# Patient Record
Sex: Male | Born: 1955 | Race: White | Hispanic: No | Marital: Married | Smoking: Current some day smoker
Health system: Southern US, Community
[De-identification: ages and names within clinical notes are randomized; demographics above are authoritative.]

## PROBLEM LIST (undated history)

## (undated) DIAGNOSIS — C801 Malignant (primary) neoplasm, unspecified: Secondary | ICD-10-CM

## (undated) DIAGNOSIS — K219 Gastro-esophageal reflux disease without esophagitis: Secondary | ICD-10-CM

## (undated) HISTORY — DX: Gastro-esophageal reflux disease without esophagitis: K21.9

---

## 2014-11-20 ENCOUNTER — Other Ambulatory Visit: Payer: Self-pay | Admitting: Orthopedic Surgery

## 2014-11-20 DIAGNOSIS — M5416 Radiculopathy, lumbar region: Secondary | ICD-10-CM

## 2014-12-01 ENCOUNTER — Ambulatory Visit
Admission: RE | Admit: 2014-12-01 | Discharge: 2014-12-01 | Disposition: A | Discharge status: Home / Self Care | Payer: 59 | Source: Ambulatory Visit | Attending: Orthopedic Surgery | Admitting: Orthopedic Surgery

## 2014-12-01 DIAGNOSIS — M5416 Radiculopathy, lumbar region: Secondary | ICD-10-CM

## 2017-09-03 DIAGNOSIS — H353131 Nonexudative age-related macular degeneration, bilateral, early dry stage: Secondary | ICD-10-CM | POA: Diagnosis not present

## 2018-04-13 DIAGNOSIS — R1013 Epigastric pain: Secondary | ICD-10-CM | POA: Diagnosis not present

## 2018-04-13 DIAGNOSIS — R945 Abnormal results of liver function studies: Secondary | ICD-10-CM | POA: Diagnosis not present

## 2018-04-13 DIAGNOSIS — M7580 Other shoulder lesions, unspecified shoulder: Secondary | ICD-10-CM | POA: Diagnosis not present

## 2018-04-13 DIAGNOSIS — M545 Low back pain: Secondary | ICD-10-CM | POA: Diagnosis not present

## 2018-04-19 DIAGNOSIS — M545 Low back pain: Secondary | ICD-10-CM | POA: Diagnosis not present

## 2018-04-19 DIAGNOSIS — R600 Localized edema: Secondary | ICD-10-CM | POA: Diagnosis not present

## 2018-04-22 ENCOUNTER — Emergency Department (HOSPITAL_COMMUNITY): Payer: 59

## 2018-04-22 ENCOUNTER — Inpatient Hospital Stay (HOSPITAL_COMMUNITY)
Admission: EM | Admit: 2018-04-22 | Discharge: 2018-04-25 | DRG: 436 | Disposition: A | Discharge status: Home / Self Care | Payer: 59 | Source: Ambulatory Visit | Attending: Internal Medicine | Admitting: Internal Medicine

## 2018-04-22 ENCOUNTER — Ambulatory Visit (HOSPITAL_COMMUNITY): Payer: 59

## 2018-04-22 ENCOUNTER — Encounter: Payer: Self-pay | Admitting: Family Medicine

## 2018-04-22 ENCOUNTER — Other Ambulatory Visit: Payer: Self-pay

## 2018-04-22 ENCOUNTER — Encounter (HOSPITAL_COMMUNITY): Payer: Self-pay | Admitting: *Deleted

## 2018-04-22 ENCOUNTER — Ambulatory Visit (INDEPENDENT_AMBULATORY_CARE_PROVIDER_SITE_OTHER): Payer: 59 | Admitting: Family Medicine

## 2018-04-22 VITALS — BP 110/72 | HR 101 | Temp 97.1°F | Ht 70.0 in | Wt 190.2 lb

## 2018-04-22 DIAGNOSIS — C180 Malignant neoplasm of cecum: Secondary | ICD-10-CM | POA: Diagnosis not present

## 2018-04-22 DIAGNOSIS — R601 Generalized edema: Secondary | ICD-10-CM | POA: Diagnosis not present

## 2018-04-22 DIAGNOSIS — C7800 Secondary malignant neoplasm of unspecified lung: Secondary | ICD-10-CM | POA: Diagnosis not present

## 2018-04-22 DIAGNOSIS — F17210 Nicotine dependence, cigarettes, uncomplicated: Secondary | ICD-10-CM | POA: Diagnosis present

## 2018-04-22 DIAGNOSIS — D123 Benign neoplasm of transverse colon: Secondary | ICD-10-CM

## 2018-04-22 DIAGNOSIS — G8929 Other chronic pain: Secondary | ICD-10-CM | POA: Diagnosis present

## 2018-04-22 DIAGNOSIS — R0602 Shortness of breath: Secondary | ICD-10-CM

## 2018-04-22 DIAGNOSIS — K219 Gastro-esophageal reflux disease without esophagitis: Secondary | ICD-10-CM | POA: Diagnosis present

## 2018-04-22 DIAGNOSIS — R590 Localized enlarged lymph nodes: Secondary | ICD-10-CM | POA: Diagnosis present

## 2018-04-22 DIAGNOSIS — R609 Edema, unspecified: Secondary | ICD-10-CM | POA: Diagnosis not present

## 2018-04-22 DIAGNOSIS — R918 Other nonspecific abnormal finding of lung field: Secondary | ICD-10-CM

## 2018-04-22 DIAGNOSIS — C787 Secondary malignant neoplasm of liver and intrahepatic bile duct: Secondary | ICD-10-CM | POA: Diagnosis not present

## 2018-04-22 DIAGNOSIS — D72828 Other elevated white blood cell count: Secondary | ICD-10-CM | POA: Diagnosis present

## 2018-04-22 DIAGNOSIS — D124 Benign neoplasm of descending colon: Secondary | ICD-10-CM

## 2018-04-22 DIAGNOSIS — I878 Other specified disorders of veins: Secondary | ICD-10-CM | POA: Diagnosis present

## 2018-04-22 DIAGNOSIS — R600 Localized edema: Secondary | ICD-10-CM | POA: Diagnosis not present

## 2018-04-22 DIAGNOSIS — D125 Benign neoplasm of sigmoid colon: Secondary | ICD-10-CM | POA: Diagnosis not present

## 2018-04-22 DIAGNOSIS — D122 Benign neoplasm of ascending colon: Secondary | ICD-10-CM | POA: Diagnosis present

## 2018-04-22 DIAGNOSIS — K7290 Hepatic failure, unspecified without coma: Secondary | ICD-10-CM | POA: Diagnosis not present

## 2018-04-22 DIAGNOSIS — C799 Secondary malignant neoplasm of unspecified site: Secondary | ICD-10-CM

## 2018-04-22 DIAGNOSIS — M545 Low back pain: Secondary | ICD-10-CM | POA: Diagnosis present

## 2018-04-22 DIAGNOSIS — R0600 Dyspnea, unspecified: Secondary | ICD-10-CM | POA: Diagnosis not present

## 2018-04-22 DIAGNOSIS — K648 Other hemorrhoids: Secondary | ICD-10-CM | POA: Diagnosis present

## 2018-04-22 DIAGNOSIS — H3530 Unspecified macular degeneration: Secondary | ICD-10-CM | POA: Diagnosis present

## 2018-04-22 DIAGNOSIS — J9811 Atelectasis: Secondary | ICD-10-CM | POA: Diagnosis not present

## 2018-04-22 DIAGNOSIS — M549 Dorsalgia, unspecified: Secondary | ICD-10-CM

## 2018-04-22 DIAGNOSIS — R160 Hepatomegaly, not elsewhere classified: Secondary | ICD-10-CM | POA: Diagnosis present

## 2018-04-22 DIAGNOSIS — M479 Spondylosis, unspecified: Secondary | ICD-10-CM | POA: Diagnosis present

## 2018-04-22 DIAGNOSIS — K639 Disease of intestine, unspecified: Secondary | ICD-10-CM | POA: Diagnosis not present

## 2018-04-22 DIAGNOSIS — R9431 Abnormal electrocardiogram [ECG] [EKG]: Secondary | ICD-10-CM | POA: Diagnosis not present

## 2018-04-22 DIAGNOSIS — R000 Tachycardia, unspecified: Secondary | ICD-10-CM | POA: Diagnosis not present

## 2018-04-22 DIAGNOSIS — K769 Liver disease, unspecified: Secondary | ICD-10-CM | POA: Diagnosis not present

## 2018-04-22 DIAGNOSIS — C229 Malignant neoplasm of liver, not specified as primary or secondary: Secondary | ICD-10-CM | POA: Diagnosis not present

## 2018-04-22 DIAGNOSIS — K5730 Diverticulosis of large intestine without perforation or abscess without bleeding: Secondary | ICD-10-CM | POA: Diagnosis present

## 2018-04-22 DIAGNOSIS — C189 Malignant neoplasm of colon, unspecified: Secondary | ICD-10-CM

## 2018-04-22 DIAGNOSIS — C801 Malignant (primary) neoplasm, unspecified: Secondary | ICD-10-CM | POA: Diagnosis present

## 2018-04-22 DIAGNOSIS — R1903 Right lower quadrant abdominal swelling, mass and lump: Secondary | ICD-10-CM | POA: Diagnosis not present

## 2018-04-22 DIAGNOSIS — K6389 Other specified diseases of intestine: Secondary | ICD-10-CM | POA: Diagnosis present

## 2018-04-22 LAB — CBC
HCT: 39.7 % (ref 39.0–52.0)
Hemoglobin: 12.9 g/dL — ABNORMAL LOW (ref 13.0–17.0)
MCH: 30.9 pg (ref 26.0–34.0)
MCHC: 32.5 g/dL (ref 30.0–36.0)
MCV: 95.2 fL (ref 80.0–100.0)
Platelets: 319 K/uL (ref 150–400)
RBC: 4.17 MIL/uL — ABNORMAL LOW (ref 4.22–5.81)
RDW: 16.4 % — ABNORMAL HIGH (ref 11.5–15.5)
WBC: 15.2 K/uL — ABNORMAL HIGH (ref 4.0–10.5)
nRBC: 0 % (ref 0.0–0.2)

## 2018-04-22 LAB — HEPATIC FUNCTION PANEL
ALT: 53 U/L — ABNORMAL HIGH (ref 0–44)
AST: 93 U/L — ABNORMAL HIGH (ref 15–41)
Albumin: 2.9 g/dL — ABNORMAL LOW (ref 3.5–5.0)
Alkaline Phosphatase: 343 U/L — ABNORMAL HIGH (ref 38–126)
Bilirubin, Direct: 0.5 mg/dL — ABNORMAL HIGH (ref 0.0–0.2)
Indirect Bilirubin: 0.8 mg/dL (ref 0.3–0.9)
Total Bilirubin: 1.3 mg/dL — ABNORMAL HIGH (ref 0.3–1.2)
Total Protein: 7.2 g/dL (ref 6.5–8.1)

## 2018-04-22 LAB — BASIC METABOLIC PANEL
Anion gap: 15 (ref 5–15)
BUN: 9 mg/dL (ref 8–23)
CO2: 21 mmol/L — ABNORMAL LOW (ref 22–32)
Calcium: 8.1 mg/dL — ABNORMAL LOW (ref 8.9–10.3)
Chloride: 102 mmol/L (ref 98–111)
Creatinine, Ser: 0.9 mg/dL (ref 0.61–1.24)
GFR calc Af Amer: >60 mL/min (ref >60)
GFR calc non Af Amer: >60 mL/min (ref >60)
GLUCOSE: 122 mg/dL — AB (ref 70–99)
Potassium: 4.1 mmol/L (ref 3.5–5.1)
Sodium: 138 mmol/L (ref 135–145)

## 2018-04-22 LAB — I-STAT TROPONIN, ED: Troponin i, poc: 0.01 ng/mL (ref 0.00–0.08)

## 2018-04-22 LAB — BRAIN NATRIURETIC PEPTIDE: B Natriuretic Peptide: 174.2 pg/mL — ABNORMAL HIGH (ref 0.0–100.0)

## 2018-04-22 MED ORDER — IOPAMIDOL (ISOVUE-370) INJECTION 76%
100.0000 mL | Freq: Once | INTRAVENOUS | Status: AC | PRN
  Administered 2018-04-22: 100 mL via INTRAVENOUS

## 2018-04-22 MED ORDER — SODIUM CHLORIDE 0.9% FLUSH
3.0000 mL | Freq: Two times a day (BID) | INTRAVENOUS | Status: DC
  Administered 2018-04-23 – 2018-04-24 (×3): 3 mL via INTRAVENOUS

## 2018-04-22 MED ORDER — SODIUM CHLORIDE 0.9% FLUSH
3.0000 mL | Freq: Once | INTRAVENOUS | Status: AC
  Administered 2018-04-22: 3 mL via INTRAVENOUS

## 2018-04-22 MED ORDER — ONDANSETRON HCL 4 MG/2ML IJ SOLN: 4.0000 mg | Freq: Four times a day (QID) | INTRAMUSCULAR | Status: DC | PRN

## 2018-04-22 MED ORDER — ZOLPIDEM TARTRATE 5 MG PO TABS
5.0000 mg | ORAL_TABLET | Freq: Every evening | ORAL | Status: DC | PRN
  Administered 2018-04-23: 5 mg via ORAL
  Administered 2018-04-23 – 2018-04-24 (×2): 10 mg via ORAL
  Filled 2018-04-22 (×2): qty 2
  Filled 2018-04-22: qty 1

## 2018-04-22 MED ORDER — SODIUM CHLORIDE 0.9% FLUSH
3.0000 mL | Freq: Two times a day (BID) | INTRAVENOUS | Status: DC
  Administered 2018-04-23 – 2018-04-25 (×3): 3 mL via INTRAVENOUS

## 2018-04-22 MED ORDER — SODIUM CHLORIDE 0.9 % IV SOLN: 250.0000 mL | INTRAVENOUS | Status: DC | PRN

## 2018-04-22 MED ORDER — ENOXAPARIN SODIUM 40 MG/0.4ML ~~LOC~~ SOLN
40.0000 mg | SUBCUTANEOUS | Status: DC
  Administered 2018-04-23: 40 mg via SUBCUTANEOUS
  Filled 2018-04-22: qty 0.4

## 2018-04-22 MED ORDER — IOPAMIDOL (ISOVUE-370) INJECTION 76%
INTRAVENOUS | Status: AC
  Filled 2018-04-22: qty 100

## 2018-04-22 MED ORDER — POLYETHYLENE GLYCOL 3350 17 G PO PACK: 17.0000 g | PACK | Freq: Every day | ORAL | Status: DC | PRN

## 2018-04-22 MED ORDER — SODIUM CHLORIDE 0.9% FLUSH: 3.0000 mL | INTRAVENOUS | Status: DC | PRN

## 2018-04-22 MED ORDER — IOHEXOL 300 MG/ML  SOLN
100.0000 mL | Freq: Once | INTRAMUSCULAR | Status: AC | PRN
  Administered 2018-04-22: 100 mL via INTRAVENOUS

## 2018-04-22 MED ORDER — FENTANYL CITRATE (PF) 100 MCG/2ML IJ SOLN
25.0000 ug | INTRAMUSCULAR | Status: DC | PRN
  Administered 2018-04-23 – 2018-04-24 (×3): 50 ug via INTRAVENOUS
  Filled 2018-04-22 (×3): qty 2

## 2018-04-22 MED ORDER — ONDANSETRON HCL 4 MG PO TABS: 4.0000 mg | ORAL_TABLET | Freq: Four times a day (QID) | ORAL | Status: DC | PRN

## 2018-04-22 NOTE — Progress Notes (Signed)
LE venous duplex       has been completed. Preliminary results can be found under CV proc through chart review. Rashaud Ybarbo, BS, RDMS, RVT   

## 2018-04-22 NOTE — ED Triage Notes (Signed)
Pt reports SOB ongoing for 2-3 wks, pt went to pcp for bil lower leg swelling onset x 5 days, pcp sent here for eval of abnormal EKG, pt denies CP, denies n/v/d, pt denies current SOB, A&O x4

## 2018-04-22 NOTE — H&P (Signed)
History and Physical    Thomas Riley DTO:[id number] DOB: 1955-06-24 DOA: 04/22/2018  PCP: Vivi Barrack, MD   Patient coming from: Home   Chief Complaint: Leg swelling   HPI: Thomas Riley is a 63 y.o. male with medical history significant for tobacco abuse and chronic low back pain, now presenting to the emergency department for evaluation of bilateral leg edema.  Patient reports insidious worsening in his low back pain over the past 1 to 2 months with radiation to the bilateral legs and more recent bilateral lower extremity edema.  He also reports some mild exertional dyspnea and was having some night sweats last month.  He has not appreciated any fever or chills, denies any significant cough, denies chest pain, and denies abdominal pain, vomiting, or diarrhea.  He has never had a colonoscopy.  Denies any family history of GI cancer.  He was evaluated at an urgent care recently for his back pain with radiculopathy and leg swelling, was given a course of prednisone and Lasix, but his condition has continued to worsen.  He saw his PCP for evaluation of this today, his EKG was abnormal, and he was directed to the ED for further evaluation.  ED Course: Upon arrival to the ED, patient is found to be afebrile, saturating well on room air, tachycardic initially, and with stable blood pressure.  EKG features sinus tachycardia with rate 118, LAD, and incomplete RBBB.  Chemistry panel is notable for alkaline phosphatase 343, albumin 2.9, mild elevation in transaminases, and slightly elevated total bilirubin.  CBC features a leukocytosis to 15,200.  Troponin is normal and BNP slightly elevated.  Bilateral lower extremity venous ultrasound is negative for DVT.  CTA chest is negative for PE but concerning for scattered nodules concerning for metastatic disease.  CT of the abdomen and pelvis reveals a exophytic mass in the right lower quadrant, suspected to be arising from the cecum, possibly from adjacent  ileum, with diffusely enlarged liver with innumerable hypodense lesions concerning for widespread metastatic disease.  Also noted on CT abdomen is mesenteric adenopathy.  Oncology was consulted by the ED physician and recommended medical admission to [facility] for inpatient work-up, likely to include colonoscopy.  Review of Systems:  All other systems reviewed and apart from HPI, are negative.  Past Medical History:  Diagnosis Date  . GERD (gastroesophageal reflux disease)     History reviewed. No pertinent surgical history.   reports that he has been smoking cigarettes. He has been smoking about 0.30 packs per day. He has never used smokeless tobacco. He reports current alcohol use of about 12.0 standard drinks of alcohol per week. He reports that he does not use drugs.  No Known Allergies  Family History  Problem Relation Age of Onset  . COPD Mother   . Heart disease Father   . COPD Brother   . Parkinson's disease Brother   . Cancer Neg Hx      Prior to Admission medications   Medication Sig Start Date End Date Taking? Authorizing Provider  acetaminophen (TYLENOL) 325 MG tablet Take 325-650 mg by mouth every 8 (eight) hours as needed for headache.   Yes [provider]  furosemide (LASIX) 20 MG tablet Take 10 mg by mouth daily.  04/19/18  Yes [provider]  naproxen (NAPROSYN) 500 MG tablet Take 500 mg by mouth 2 (two) times daily.  04/19/18  Yes [provider]  omeprazole (PRILOSEC) 20 MG capsule Take 20 mg by mouth daily.  04/13/18  Yes [provider]  predniSONE (DELTASONE) 10 MG tablet Take 10-40 mg by mouth as directed. Take 40 mg by mouth once a day for 3 days, 30 mg once a day for 3 days, 20 mg once a day for 3 days, then 10 mg once a day for 3 days   Yes [provider]    Physical Exam: Vitals:   04/22/18 2030 04/22/18 2115 04/22/18 2200 04/22/18 2245  BP: 131/70 131/69 123/71 (!) 124/59  Pulse:      Resp: 13  14 15 19   Temp:      TempSrc:      SpO2:      Weight:      Height:        Constitutional: NAD, calm  Eyes: PERTLA, lids and conjunctivae normal ENMT: Mucous membranes are moist. Posterior pharynx clear of any exudate or lesions.   Neck: normal, supple, no masses, no thyromegaly Respiratory: clear to auscultation bilaterally, no wheezing, no crackles. Normal respiratory effort.    Cardiovascular: S1 & S2 heard, regular rate and rhythm. Pitting edema to bilateral LE's. Abdomen: Mild distension, soft, non-tender. Bowel sounds active.  Musculoskeletal: no clubbing / cyanosis. No joint deformity upper and lower extremities.    Skin: no significant rashes, lesions, ulcers. Warm, dry, well-perfused. Neurologic: CN 2-12 grossly intact. Sensation intact. Moving all extremities.  Psychiatric: Alert and oriented x 3. Calm, cooperative.    Labs on Admission: I have personally reviewed following labs and imaging studies  CBC: Recent Labs  Lab 04/22/18 1556  WBC 15.2*  HGB 12.9*  HCT 39.7  MCV 95.2  PLT 656   Basic Metabolic Panel: Recent Labs  Lab 04/22/18 1556  NA 138  K 4.1  CL 102  CO2 21*  GLUCOSE 122*  BUN 9  CREATININE 0.90  CALCIUM 8.1*   GFR: Estimated Creatinine Clearance: 86.7 mL/min (by C-G formula based on SCr of 0.9 mg/dL). Liver Function Tests: Recent Labs  Lab 04/22/18 1905  AST 93*  ALT 53*  ALKPHOS 343*  BILITOT 1.3*  PROT 7.2  ALBUMIN 2.9*   No results for input(s): LIPASE, AMYLASE in the last 168 hours. No results for input(s): AMMONIA in the last 168 hours. Coagulation Profile: No results for input(s): INR, PROTIME in the last 168 hours. Cardiac Enzymes: No results for input(s): CKTOTAL, CKMB, CKMBINDEX, TROPONINI in the last 168 hours. BNP (last 3 results) No results for input(s): PROBNP in the last 8760 hours. HbA1C: No results for input(s): HGBA1C in the last 72 hours. CBG: No results for input(s): GLUCAP in the last 168 hours. Lipid  Profile: No results for input(s): CHOL, HDL, LDLCALC, TRIG, CHOLHDL, LDLDIRECT in the last 72 hours. Thyroid Function Tests: No results for input(s): TSH, T4TOTAL, FREET4, T3FREE, THYROIDAB in the last 72 hours. Anemia Panel: No results for input(s): VITAMINB12, FOLATE, FERRITIN, TIBC, IRON, RETICCTPCT in the last 72 hours. Urine analysis: No results found for: COLORURINE, APPEARANCEUR, LABSPEC, PHURINE, GLUCOSEU, HGBUR, BILIRUBINUR, KETONESUR, PROTEINUR, UROBILINOGEN, NITRITE, LEUKOCYTESUR Sepsis Labs: @LABRCNTIP (procalcitonin:4,lacticidven:4) )No results found for this or any previous visit (from the past 240 hour(s)).   Radiological Exams on Admission: Dg Chest 2 View  Result Date: 04/22/2018 CLINICAL DATA:  Feet and ankle swelling. EXAM: CHEST - 2 VIEW COMPARISON:  No prior. FINDINGS: Mediastinum hilar structures normal. Heart size normal. Mild right upper lobe infiltrate. Mild bibasilar atelectasis. Follow-up chest x-ray to demonstrate clearing suggested. No pleural effusion or pneumothorax. IMPRESSION: Mild right upper lobe infiltrate. Mild bibasilar  atelectasis. Follow-up chest x-rays to demonstrate clearing suggested. Followup PA and lateral chest X-ray is recommended in 3-4 weeks following trial of antibiotic therapy to ensure resolution and exclude underlying malignancy. Electronically Signed   By: Marcello Moores  Register   On: 04/22/2018 16:30   Ct Angio Chest Pe W And/or Wo Contrast  Result Date: 04/22/2018 CLINICAL DATA:  Dyspnea ongoing for 2-3 weeks. Bilateral lower extremity swelling onset 5 days ago. EXAM: CT ANGIOGRAPHY CHEST WITH CONTRAST TECHNIQUE: Multidetector CT imaging of the chest was performed using the standard protocol during bolus administration of intravenous contrast. Multiplanar CT image reconstructions and MIPs were obtained to evaluate the vascular anatomy. CONTRAST:  55 cc ISOVUE-370 IOPAMIDOL (ISOVUE-370) INJECTION 76% COMPARISON:  CXR 04/22/2018 FINDINGS:  Cardiovascular: Conventional branch pattern of the great vessels with atherosclerosis of the left subclavian artery. Nonaneurysmal minimally atherosclerotic thoracic aorta. No dissection. Satisfactory opacification of the pulmonary arteries to the segmental level without acute pulmonary embolus. Heart size is top normal. No pericardial effusion or thickening. No significant calcific coronary arteriosclerosis. Mediastinum/Nodes: No enlarged mediastinal, hilar, or axillary lymph nodes. Thyroid gland, trachea, and esophagus demonstrate no significant findings. Lungs/Pleura: Scattered noncalcified pulmonary nodules are identified bilaterally, measuring up to 4.7 mm. Two of these are seen in the right upper lobe anterior segment (4.6 mm each) series 6/71, one in the right middle lobe (4.7 mm in average), image 68, one in the left upper lobe (4.6 mm) image 72 and a 3 mm nodule in the right lower lobe, image 86. Dependent bibasilar atelectasis is noted. No pneumothorax or pulmonary consolidations. Trachea and mainstem bronchi appear patent. Upper Abdomen: The liver is enlarged and in homogeneous in appearance. Subtle underlying hypodense masses are not excluded but suboptimally characterized on this exam. A calcified granuloma is seen in the left hepatic lobe. The included left adrenal gland is unremarkable. The right adrenal gland is excluded. Musculoskeletal: No chest wall mass. No aggressive osseous lesion of the bony thorax. No acute fracture. Review of the MIP images confirms the above findings. IMPRESSION: 1. No acute pulmonary embolus, aortic aneurysm or dissection. 2. Scattered bilateral noncalcified pulmonary nodules measuring up to 4.7 mm. This in conjunction with an enlarged heterogeneous, abnormal appearance of the liver with subtle hypodense masslike abnormalities within raise concern for possible metastatic disease. Dedicated CT of the abdomen and pelvis with IV contrast is recommended for further  correlation. Aortic Atherosclerosis (ICD10-I70.0). Electronically Signed   By: Ashley Royalty M.D.   On: 04/22/2018 19:01   Ct Abdomen Pelvis W Contrast  Result Date: 04/22/2018 CLINICAL DATA:  Initial evaluation for acute abdominal distension. EXAM: CT ABDOMEN AND PELVIS WITH CONTRAST TECHNIQUE: Multidetector CT imaging of the abdomen and pelvis was performed using the standard protocol following bolus administration of intravenous contrast. CONTRAST:  164mL OMNIPAQUE IOHEXOL 300 MG/ML  SOLN COMPARISON:  Prior CTA from earlier the same day. FINDINGS: Lower chest: Mild scattered subsegmental atelectatic changes present within the lung bases. 5 mm nodule within the lower right lung, better evaluated on recent chest CT. Small pericardial effusion. Hepatobiliary: Liver is diffusely enlarged with innumerable hypodense lesions scattered throughout both the right and left hepatic lobes, highly concerning for widespread hepatic metastases. Liver demonstrates a abnormal nodular contour or. For reference purposes, the dominant lesion position within the central aspect of the liver measures approximately 7.3 x 6.2 cm (series 3, image 40). Partially distended gallbladder without acute abnormality. Trace pericholecystic fluid noted, favored to be related intrinsic liver disease. No biliary dilatation. Pancreas: Pancreas within  normal limits. Spleen: Spleen within normal limits. Adrenals/Urinary Tract: 15 mm left adrenal nodule, indeterminate. Right adrenal gland grossly unremarkable, although poorly visualized due to mass effect by the adjacent liver. Kidneys fairly equal in size with symmetric enhancement. Subcentimeter hypodensity at the lower pole of the right kidney too small the characterize. No visible nephrolithiasis or hydronephrosis. No focal enhancing renal mass. Contrast material within the renal collecting systems bilaterally. No hydroureter. Partially distended bladder without acute abnormality. Secreted IV  contrast material within the bladder lumen. Stomach/Bowel: Stomach displaced towards the left upper quadrant by the enlarged liver, but otherwise unremarkable without acute finding. No evidence for small bowel obstruction. There is an irregular exophytic enhancing mass within the right lower quadrant measuring approximately 4.3 x 3.1 x 4.7 cm (series 3, image 66). Mass is intimately associated with an adjacent loop of ileum as well as the cecum, making it not entirely clear from which the lesion is arising from, although cecal origin is favored. Additionally, the appendix is not definitely visualized, raising the possibility for an appendiceal origin which could be considered as well. Surrounding inflammatory stranding and induration within the adjacent mesenteric fat. No other acute inflammatory changes about the bowels. Few scattered colonic diverticula noted. Vascular/Lymphatic: Normal intravascular enhancement seen throughout the intra-abdominal aorta. Mild to moderate aorto bi-iliac atherosclerotic disease. No aneurysm. Mesenteric vessels patent proximally. Enlarged 13 mm mesenteric lymph node within the right lower quadrant, concerning for nodal metastasis (series 3, image 66). No other pathologically enlarged lymph nodes identified within the abdomen and pelvis. Reproductive: Prostate normal. Other: Small volume mildly complex free fluid within the pelvis. No free intraperitoneal air. Musculoskeletal: Mild diffuse anasarca within the external soft tissues, likely related to intrinsic liver disease. No acute osseous abnormality. No discrete lytic or blastic osseous lesions. IMPRESSION: 1. 4.3 x 3.1 x 4.7 cm exophytic enhancing mass within the right lower quadrant, intimately associated with an adjacent loop of ileum as well as the cecum, making it not entirely clear from which the lesion is arising from, although cecal origin is perhaps favored. Additionally, the appendix is not definitely visualized, raising  the possibility for an appendiceal origin which could be considered as well. 2. Diffusely enlarged liver with innumerable hypodense lesions, concerning for widespread hepatic metastases. 3. Enlarged 13 mm mesenteric lymph node within the right lower quadrant, concerning for nodal metastasis. 4. 15 mm left adrenal nodule, indeterminate. 5. 5 mm right lower lobe pulmonary nodule, better evaluated on recent chest CT, but concerning for metastatic disease. 6. Small volume free fluid within the pelvis with mild diffuse anasarca. Electronically Signed   By: Jeannine Boga M.D.   On: 04/22/2018 20:47   Vas [place] Lower Extremity Venous (dvt) (only [facility])  Result Date: 04/22/2018  Lower Venous Study Indications: Pitting edema.  Performing Technologist: June Leap RDMS, RVT  Examination Guidelines: A complete evaluation includes B-mode imaging, spectral Doppler, color Doppler, and power Doppler as needed of all accessible portions of each vessel. Bilateral testing is considered an integral part of a complete examination. Limited examinations for reoccurring indications may be performed as noted.  Right Venous Findings: +---------+---------------+---------+-----------+----------+-------+          CompressibilityPhasicitySpontaneityPropertiesSummary +---------+---------------+---------+-----------+----------+-------+ CFV      Full           Yes      Yes                          +---------+---------------+---------+-----------+----------+-------+ SFJ  Full                                                 +---------+---------------+---------+-----------+----------+-------+ FV Prox  Full                                                 +---------+---------------+---------+-----------+----------+-------+ FV Mid   Full                                                 +---------+---------------+---------+-----------+----------+-------+ FV DistalFull                                                  +---------+---------------+---------+-----------+----------+-------+ PFV      Full                                                 +---------+---------------+---------+-----------+----------+-------+ POP      Full           Yes      Yes                          +---------+---------------+---------+-----------+----------+-------+ PTV      Full                                                 +---------+---------------+---------+-----------+----------+-------+ PERO     Full                                                 +---------+---------------+---------+-----------+----------+-------+  Left Venous Findings: +---------+---------------+---------+-----------+----------+-------+          CompressibilityPhasicitySpontaneityPropertiesSummary +---------+---------------+---------+-----------+----------+-------+ CFV      Full           Yes      Yes                          +---------+---------------+---------+-----------+----------+-------+ SFJ      Full                                                 +---------+---------------+---------+-----------+----------+-------+ FV Prox  Full                                                 +---------+---------------+---------+-----------+----------+-------+  FV Mid   Full                                                 +---------+---------------+---------+-----------+----------+-------+ FV DistalFull                                                 +---------+---------------+---------+-----------+----------+-------+ PFV      Full                                                 +---------+---------------+---------+-----------+----------+-------+ POP      Full           Yes      Yes                          +---------+---------------+---------+-----------+----------+-------+ PTV      Full                                                  +---------+---------------+---------+-----------+----------+-------+ PERO     Full                                                 +---------+---------------+---------+-----------+----------+-------+    Summary: Right: There is no evidence of deep vein thrombosis in the lower extremity. No cystic structure found in the popliteal fossa. Left: There is no evidence of deep vein thrombosis in the lower extremity. No cystic structure found in the popliteal fossa.  *See table(s) above for measurements and observations.    Preliminary     EKG: Independently reviewed. Sinus tachycardia (rate 118), LAD, incomplete RBBB.   Assessment/Plan   1. Metastatic disease  - Presents with bilateral LE edema, reports recent night sweats, found to have mass arising from cecum or possibly ileum with pulmonary nodules, innumerable liver lesions, and mesenteric adenopathy  - He has never had colonoscopy  - Oncology consulting and much appreciated, recommending admission to [facility] for inpatient workup   2. Anasarca  - Presents with bilateral leg edema developing over past week, found to have anasarca  - Bilateral LE venous US negative for DVT  - No hx of heart or kidney disease  - Likely secondary to liver lesions, possible compression of IVC from tumor     DVT prophylaxis: Lovenox  Code Status: Full  Family Communication: Discussed with patient  Consults called: Oncology  Admission status: Inpatient     Vianne Bulls, MD [facility] Pager [phone]  If 7PM-7AM, please contact night-coverage [email or web address] Password [email or web address]  04/22/2018, 11:01 PM

## 2018-04-22 NOTE — ED Notes (Signed)
Patient ambulated to bathroom with minimal assistance.  

## 2018-04-22 NOTE — ED Provider Notes (Signed)
Emergency Department Provider Note   I have reviewed the triage vital signs and the nursing notes.   HISTORY  Chief Complaint Leg Swelling and Abnormal ECG   HPI Thomas Riley is a 63 y.o. male with unknown past medical history as he does not have a primary care doctor until today presents the emergency department with leg swelling.  Saw that the patient's had some chronic back issues does cause some progressively worsening weakness in his legs however since Monday he had significant edema to bilateral legs.  This came on suddenly and has progressively improved while he is on Lasix from the urgent care however he followed up with the primary care doctor today who did an EKG that showed a right bundle branch block with possible ischemic changes so sent to the ER for further evaluation.  Patient states that he has had no cough or fevers but does have dyspnea on exertion and dyspnea when bending over.  No orthopnea PND.  On review of the records it seems that Dr. Jerline Pain noticed the patient had significant dyspnea when just bending over to tie his shoes.  No known blood clots.  No chest pain. No other associated or modifying symptoms.    Past Medical History:  Diagnosis Date  . GERD (gastroesophageal reflux disease)     Patient Active Problem List   Diagnosis Date Noted  . Metastatic disease ([place]) 04/22/2018  . Pulmonary nodules 04/22/2018  . Liver lesion 04/22/2018  . Intestinal mass 04/22/2018  . Anasarca 04/22/2018  . Peripheral edema     History reviewed. No pertinent surgical history.  Current Outpatient Rx  . Order #: [id number] Class: Historical Med  . Order #: [id number] Class: Historical Med  . Order #: [id number] Class: Historical Med  . Order #: [id number] Class: Historical Med  . Order #: [id number] Class: Historical Med    Allergies Patient has no known allergies.  Family History  Problem Relation Age of Onset  . COPD Mother   . Heart disease Father   . COPD  Brother   . Parkinson's disease Brother   . Cancer Neg Hx     Social History Social History   Tobacco Use  . Smoking status: Current Some Day Smoker    Packs/day: 0.30    Types: Cigarettes  . Smokeless tobacco: Never Used  Substance Use Topics  . Alcohol use: Yes    Alcohol/week: 12.0 standard drinks    Types: 12 Cans of beer per week  . Drug use: Never    Review of Systems  All other systems negative except as documented in the HPI. All pertinent positives and negatives as reviewed in the HPI. ____________________________________________   PHYSICAL EXAM:  VITAL SIGNS: ED Triage Vitals  Enc Vitals Group     BP 04/22/18 1549 126/66     Pulse Rate 04/22/18 1549 (!) 110     Resp 04/22/18 1549 14     Temp 04/22/18 1549 97.9 F (36.6 C)     Temp Source 04/22/18 1549 Oral     SpO2 04/22/18 1549 98 %     Weight 04/22/18 1547 190 lb (86.2 kg)     Height 04/22/18 1547 5\' 10"  (1.778 m)     Head Circumference --      Peak Flow --      Pain Score 04/22/18 1547 0     Pain Loc --      Pain Edu? --      Excl. in [place]? --     Constitutional:  Alert and oriented. Well appearing and in no acute distress. Eyes: Conjunctivae are normal. PERRL. EOMI. Head: Atraumatic. Nose: No congestion/rhinnorhea. Mouth/Throat: Mucous membranes are moist.  Oropharynx non-erythematous. Neck: No stridor.  No meningeal signs.   Cardiovascular: tachycardic rate, regular rhythm. Good peripheral circulation. Grossly normal heart sounds.   Respiratory: tachypneic respiratory effort.  No retractions. Lungs CTAB. Gastrointestinal: Soft and nontender. No distention.  Musculoskeletal: No lower extremity tenderness but has bilateral 2+ edema to knees. No gross deformities of extremities. Neurologic:  Normal speech and language. No gross focal neurologic deficits are appreciated.  Skin:  Skin is warm, dry and intact. No rash noted.   ____________________________________________   LABS (all labs ordered  are listed, but only abnormal results are displayed)  Labs Reviewed  BASIC METABOLIC PANEL - Abnormal; Notable for the following components:      Result Value   CO2 21 (*)    Glucose, Bld 122 (*)    Calcium 8.1 (*)    All other components within normal limits  CBC - Abnormal; Notable for the following components:   WBC 15.2 (*)    RBC 4.17 (*)    Hemoglobin 12.9 (*)    RDW 16.4 (*)    All other components within normal limits  BRAIN NATRIURETIC PEPTIDE - Abnormal; Notable for the following components:   B Natriuretic Peptide 174.2 (*)    All other components within normal limits  HEPATIC FUNCTION PANEL - Abnormal; Notable for the following components:   Albumin 2.9 (*)    AST 93 (*)    ALT 53 (*)    Alkaline Phosphatase 343 (*)    Total Bilirubin 1.3 (*)    Bilirubin, Direct 0.5 (*)    All other components within normal limits  HIV ANTIBODY (ROUTINE TESTING W REFLEX)  COMPREHENSIVE METABOLIC PANEL  CBC WITH DIFFERENTIAL/PLATELET  I-STAT TROPONIN, ED   ____________________________________________  EKG   EKG Interpretation  Date/Time:  Friday April 22 2018 15:43:24 EST Ventricular Rate:  118 PR Interval:  114 QRS Duration: 104 QT Interval:  370 QTC Calculation: 518 R Axis:   -43 Text Interpretation:  Sinus tachycardia Left axis deviation Incomplete right bundle branch block Inferior infarct , age undetermined Possible Anterolateral infarct , age undetermined Abnormal ECG No old tracing to compare Confirmed by Merrily Pew [phone]) on 04/22/2018 4:24:02 PM       ____________________________________________  RADIOLOGY  Dg Chest 2 View  Result Date: 04/22/2018 CLINICAL DATA:  Feet and ankle swelling. EXAM: CHEST - 2 VIEW COMPARISON:  No prior. FINDINGS: Mediastinum hilar structures normal. Heart size normal. Mild right upper lobe infiltrate. Mild bibasilar atelectasis. Follow-up chest x-ray to demonstrate clearing suggested. No pleural effusion or pneumothorax.  IMPRESSION: Mild right upper lobe infiltrate. Mild bibasilar atelectasis. Follow-up chest x-rays to demonstrate clearing suggested. Followup PA and lateral chest X-ray is recommended in 3-4 weeks following trial of antibiotic therapy to ensure resolution and exclude underlying malignancy. Electronically Signed   By: Marcello Moores  Register   On: 04/22/2018 16:30   Ct Angio Chest Pe W And/or Wo Contrast  Result Date: 04/22/2018 CLINICAL DATA:  Dyspnea ongoing for 2-3 weeks. Bilateral lower extremity swelling onset 5 days ago. EXAM: CT ANGIOGRAPHY CHEST WITH CONTRAST TECHNIQUE: Multidetector CT imaging of the chest was performed using the standard protocol during bolus administration of intravenous contrast. Multiplanar CT image reconstructions and MIPs were obtained to evaluate the vascular anatomy. CONTRAST:  55 cc ISOVUE-370 IOPAMIDOL (ISOVUE-370) INJECTION 76% COMPARISON:  CXR 04/22/2018 FINDINGS: Cardiovascular:  Conventional branch pattern of the great vessels with atherosclerosis of the left subclavian artery. Nonaneurysmal minimally atherosclerotic thoracic aorta. No dissection. Satisfactory opacification of the pulmonary arteries to the segmental level without acute pulmonary embolus. Heart size is top normal. No pericardial effusion or thickening. No significant calcific coronary arteriosclerosis. Mediastinum/Nodes: No enlarged mediastinal, hilar, or axillary lymph nodes. Thyroid gland, trachea, and esophagus demonstrate no significant findings. Lungs/Pleura: Scattered noncalcified pulmonary nodules are identified bilaterally, measuring up to 4.7 mm. Two of these are seen in the right upper lobe anterior segment (4.6 mm each) series 6/71, one in the right middle lobe (4.7 mm in average), image 68, one in the left upper lobe (4.6 mm) image 72 and a 3 mm nodule in the right lower lobe, image 86. Dependent bibasilar atelectasis is noted. No pneumothorax or pulmonary consolidations. Trachea and mainstem bronchi  appear patent. Upper Abdomen: The liver is enlarged and in homogeneous in appearance. Subtle underlying hypodense masses are not excluded but suboptimally characterized on this exam. A calcified granuloma is seen in the left hepatic lobe. The included left adrenal gland is unremarkable. The right adrenal gland is excluded. Musculoskeletal: No chest wall mass. No aggressive osseous lesion of the bony thorax. No acute fracture. Review of the MIP images confirms the above findings. IMPRESSION: 1. No acute pulmonary embolus, aortic aneurysm or dissection. 2. Scattered bilateral noncalcified pulmonary nodules measuring up to 4.7 mm. This in conjunction with an enlarged heterogeneous, abnormal appearance of the liver with subtle hypodense masslike abnormalities within raise concern for possible metastatic disease. Dedicated CT of the abdomen and pelvis with IV contrast is recommended for further correlation. Aortic Atherosclerosis (ICD10-I70.0). Electronically Signed   By: Ashley Royalty M.D.   On: 04/22/2018 19:01   Ct Abdomen Pelvis W Contrast  Result Date: 04/22/2018 CLINICAL DATA:  Initial evaluation for acute abdominal distension. EXAM: CT ABDOMEN AND PELVIS WITH CONTRAST TECHNIQUE: Multidetector CT imaging of the abdomen and pelvis was performed using the standard protocol following bolus administration of intravenous contrast. CONTRAST:  153mL OMNIPAQUE IOHEXOL 300 MG/ML  SOLN COMPARISON:  Prior CTA from earlier the same day. FINDINGS: Lower chest: Mild scattered subsegmental atelectatic changes present within the lung bases. 5 mm nodule within the lower right lung, better evaluated on recent chest CT. Small pericardial effusion. Hepatobiliary: Liver is diffusely enlarged with innumerable hypodense lesions scattered throughout both the right and left hepatic lobes, highly concerning for widespread hepatic metastases. Liver demonstrates a abnormal nodular contour or. For reference purposes, the dominant lesion  position within the central aspect of the liver measures approximately 7.3 x 6.2 cm (series 3, image 40). Partially distended gallbladder without acute abnormality. Trace pericholecystic fluid noted, favored to be related intrinsic liver disease. No biliary dilatation. Pancreas: Pancreas within normal limits. Spleen: Spleen within normal limits. Adrenals/Urinary Tract: 15 mm left adrenal nodule, indeterminate. Right adrenal gland grossly unremarkable, although poorly visualized due to mass effect by the adjacent liver. Kidneys fairly equal in size with symmetric enhancement. Subcentimeter hypodensity at the lower pole of the right kidney too small the characterize. No visible nephrolithiasis or hydronephrosis. No focal enhancing renal mass. Contrast material within the renal collecting systems bilaterally. No hydroureter. Partially distended bladder without acute abnormality. Secreted IV contrast material within the bladder lumen. Stomach/Bowel: Stomach displaced towards the left upper quadrant by the enlarged liver, but otherwise unremarkable without acute finding. No evidence for small bowel obstruction. There is an irregular exophytic enhancing mass within the right lower quadrant measuring approximately 4.3 x  3.1 x 4.7 cm (series 3, image 66). Mass is intimately associated with an adjacent loop of ileum as well as the cecum, making it not entirely clear from which the lesion is arising from, although cecal origin is favored. Additionally, the appendix is not definitely visualized, raising the possibility for an appendiceal origin which could be considered as well. Surrounding inflammatory stranding and induration within the adjacent mesenteric fat. No other acute inflammatory changes about the bowels. Few scattered colonic diverticula noted. Vascular/Lymphatic: Normal intravascular enhancement seen throughout the intra-abdominal aorta. Mild to moderate aorto bi-iliac atherosclerotic disease. No aneurysm.  Mesenteric vessels patent proximally. Enlarged 13 mm mesenteric lymph node within the right lower quadrant, concerning for nodal metastasis (series 3, image 66). No other pathologically enlarged lymph nodes identified within the abdomen and pelvis. Reproductive: Prostate normal. Other: Small volume mildly complex free fluid within the pelvis. No free intraperitoneal air. Musculoskeletal: Mild diffuse anasarca within the external soft tissues, likely related to intrinsic liver disease. No acute osseous abnormality. No discrete lytic or blastic osseous lesions. IMPRESSION: 1. 4.3 x 3.1 x 4.7 cm exophytic enhancing mass within the right lower quadrant, intimately associated with an adjacent loop of ileum as well as the cecum, making it not entirely clear from which the lesion is arising from, although cecal origin is perhaps favored. Additionally, the appendix is not definitely visualized, raising the possibility for an appendiceal origin which could be considered as well. 2. Diffusely enlarged liver with innumerable hypodense lesions, concerning for widespread hepatic metastases. 3. Enlarged 13 mm mesenteric lymph node within the right lower quadrant, concerning for nodal metastasis. 4. 15 mm left adrenal nodule, indeterminate. 5. 5 mm right lower lobe pulmonary nodule, better evaluated on recent chest CT, but concerning for metastatic disease. 6. Small volume free fluid within the pelvis with mild diffuse anasarca. Electronically Signed   By: Jeannine Boga M.D.   On: 04/22/2018 20:47   Vas [place] Lower Extremity Venous (dvt) (only [facility])  Result Date: 04/22/2018  Lower Venous Study Indications: Pitting edema.  Performing Technologist: June Leap RDMS, RVT  Examination Guidelines: A complete evaluation includes B-mode imaging, spectral Doppler, color Doppler, and power Doppler as needed of all accessible portions of each vessel. Bilateral testing is considered an integral part of a complete examination.  Limited examinations for reoccurring indications may be performed as noted.  Right Venous Findings: +---------+---------------+---------+-----------+----------+-------+          CompressibilityPhasicitySpontaneityPropertiesSummary +---------+---------------+---------+-----------+----------+-------+ CFV      Full           Yes      Yes                          +---------+---------------+---------+-----------+----------+-------+ SFJ      Full                                                 +---------+---------------+---------+-----------+----------+-------+ FV Prox  Full                                                 +---------+---------------+---------+-----------+----------+-------+ FV Mid   Full                                                 +---------+---------------+---------+-----------+----------+-------+  FV DistalFull                                                 +---------+---------------+---------+-----------+----------+-------+ PFV      Full                                                 +---------+---------------+---------+-----------+----------+-------+ POP      Full           Yes      Yes                          +---------+---------------+---------+-----------+----------+-------+ PTV      Full                                                 +---------+---------------+---------+-----------+----------+-------+ PERO     Full                                                 +---------+---------------+---------+-----------+----------+-------+  Left Venous Findings: +---------+---------------+---------+-----------+----------+-------+          CompressibilityPhasicitySpontaneityPropertiesSummary +---------+---------------+---------+-----------+----------+-------+ CFV      Full           Yes      Yes                          +---------+---------------+---------+-----------+----------+-------+ SFJ      Full                                                  +---------+---------------+---------+-----------+----------+-------+ FV Prox  Full                                                 +---------+---------------+---------+-----------+----------+-------+ FV Mid   Full                                                 +---------+---------------+---------+-----------+----------+-------+ FV DistalFull                                                 +---------+---------------+---------+-----------+----------+-------+ PFV      Full                                                 +---------+---------------+---------+-----------+----------+-------+  POP      Full           Yes      Yes                          +---------+---------------+---------+-----------+----------+-------+ PTV      Full                                                 +---------+---------------+---------+-----------+----------+-------+ PERO     Full                                                 +---------+---------------+---------+-----------+----------+-------+    Summary: Right: There is no evidence of deep vein thrombosis in the lower extremity. No cystic structure found in the popliteal fossa. Left: There is no evidence of deep vein thrombosis in the lower extremity. No cystic structure found in the popliteal fossa.  *See table(s) above for measurements and observations.    Preliminary     ____________________________________________   PROCEDURES  Procedure(s) performed:   Procedures   ____________________________________________   INITIAL IMPRESSION / ASSESSMENT AND PLAN / ED COURSE  Patient with a peripheral yesterday on chest x-ray but no symptoms consistent with pneumonia.  Acute onset of bilateral lower extremity swelling with dyspnea exertion concerning for pulmonary embolus will get a CT scan.  Also could be acute onset heart failure however his blood pressure is normal, no JVD and normal lung  sounds with no known coronary artery disease or HTN so will add on a BNP.  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  Suspicion for possible IVC compression causing the lower extremity edema versus new onset heart failure.  Discussed with Dr. Marin Olp who requests patient be admitted to [facility] and they will see him in the morning help manage but no further orders tonight.  Discussed with Dr. Myna Hidalgo with [facility] hospital[facility] who will admit.   Pertinent labs & imaging results that were available during my care of the patient were reviewed by me and considered in my medical decision making (see chart for details).  ____________________________________________  FINAL CLINICAL IMPRESSION(S) / ED DIAGNOSES  Final diagnoses:  Peripheral edema     MEDICATIONS GIVEN DURING THIS VISIT:  Medications  iopamidol (ISOVUE-370) 76 % injection (has no administration in time range)  enoxaparin (LOVENOX) injection 40 mg (has no administration in time range)  sodium chloride flush (NS) 0.9 % injection 3 mL (has no administration in time range)  sodium chloride flush (NS) 0.9 % injection 3 mL (has no administration in time range)  sodium chloride flush (NS) 0.9 % injection 3 mL (has no administration in time range)  0.9 %  sodium chloride infusion (has no administration in time range)  polyethylene glycol (MIRALAX / GLYCOLAX) packet 17 g (has no administration in time range)  ondansetron (ZOFRAN) tablet 4 mg (has no administration in time range)    Or  ondansetron (ZOFRAN) injection 4 mg (has no administration in time range)  fentaNYL (SUBLIMAZE) injection 25-50 mcg (has no administration in time range)  zolpidem (AMBIEN) tablet 5-10 mg (has no administration in time range)  sodium chloride  flush (NS) 0.9 % injection 3 mL (3 mLs Intravenous Given 04/22/18 1756)  iopamidol (ISOVUE-370) 76 % injection 100 mL (100 mLs Intravenous Contrast Given 04/22/18  1831)  iohexol (OMNIPAQUE) 300 MG/ML solution 100 mL (100 mLs Intravenous Contrast Given 04/22/18 2013)     NEW OUTPATIENT MEDICATIONS STARTED DURING THIS VISIT:  New Prescriptions   No medications on file    Note:  This note was prepared with assistance of Dragon voice recognition software. Occasional wrong-word or sound-a-like substitutions may have occurred due to the inherent limitations of voice recognition software.   Merrily Pew, MD 04/22/18 [phone]

## 2018-04-22 NOTE — Progress Notes (Signed)
Chief Complaint:  Thomas Riley is a 63 y.o. male who presents today with a chief complaint of leg edema and to establish care  Assessment/Plan:  Dyspnea on exertion/leg edema/abnormal EKG Concern for anginal equivalent given his profound dyspnea on exertion-patient was observed becoming severely dyspneic with minimal exertion such as tying his shoe.  Other than smoking, he has no known risk factors however has not had primary care for several years.  Given his abnormal EKG, I think he needs rule out for ACS at this point.  Recommended patient go to the emergency department for further evaluation.  He left via private vehicle.  He will need ACS rule out.  Also will likely need VTE rule out-not sure if this was done in his urgent care visit 3 days ago.    Subjective:  HPI:  Leg Edema  Symptoms started about 4 days ago.  He went to urgent care 3 days ago and had work-up at that time including blood work and a chest x-ray.  Was reportedly told that his calcium was high and his liver numbers were high but otherwise the rest of his work-up was negative.  He was started on Lasix 10 mg daily which has not significantly helped with his symptoms.  Symptoms seem to be worsening.  Swelling is located in bilateral legs.  Worsened throughout the day.  He also notes increasing shortness of breath on exertion for the past couple of weeks.  This is also worsening.  No chest pain.  No symptoms at rest.  Patient's exercise tolerance is significantly decreased from already was previously.  No orthopnea or PND.  He also reports a history of chronic back pain.  He has seen orthopedics in the past however has not seen anybody for several years.  His pain seems to be worsening.  Pain will occasionally radiate into his right leg.  He has had worsening weakness in bilateral legs as well.  ROS: Per HPI, otherwise a complete review of systems was negative.   PMH:  The following were reviewed and entered/updated in  epic: Past Medical History:  Diagnosis Date  . GERD (gastroesophageal reflux disease)    There are no active problems to display for this patient.  History reviewed. No pertinent surgical history.  Family History  Problem Relation Age of Onset  . COPD Mother   . Heart disease Father   . COPD Brother   . Parkinson's disease Brother   . Cancer Neg Hx     Medications- reviewed and updated Current Outpatient Medications  Medication Sig Dispense Refill  . furosemide (LASIX) 20 MG tablet     . naproxen (NAPROSYN) 500 MG tablet     . omeprazole (PRILOSEC) 20 MG capsule      No current facility-administered medications for this visit.     Allergies-reviewed and updated Not on File  Social History   Socioeconomic History  . Marital status: Married    Spouse name: Not on file  . Number of children: Not on file  . Years of education: Not on file  . Highest education level: Not on file  Occupational History  . Not on file  Social Needs  . Financial resource strain: Not on file  . Food insecurity:    Worry: Not on file    Inability: Not on file  . Transportation needs:    Medical: Not on file    Non-medical: Not on file  Tobacco Use  . Smoking status: Current Some Day Smoker  Packs/day: 0.30    Types: Cigarettes  . Smokeless tobacco: Never Used  Substance and Sexual Activity  . Alcohol use: Yes    Alcohol/week: 1.0 standard drinks    Types: 1 Cans of beer per week  . Drug use: Never  . Sexual activity: Not on file  Lifestyle  . Physical activity:    Days per week: Not on file    Minutes per session: Not on file  . Stress: Not on file  Relationships  . Social connections:    Talks on phone: Not on file    Gets together: Not on file    Attends religious service: Not on file    Active member of club or organization: Not on file    Attends meetings of clubs or organizations: Not on file    Relationship status: Not on file  Other Topics Concern  . Not on file   Social History Narrative  . Not on file         Objective:  Physical Exam: BP 110/72 (BP Location: Left Arm, Patient Position: Sitting, Cuff Size: Normal)   Pulse (!) 101   Temp (!) 97.1 F (36.2 C) (Oral)   Ht 5\' 10"  (1.778 m)   Wt 190 lb 3.2 oz (86.3 kg)   SpO2 94%   BMI 27.29 kg/m   Gen: NAD, resting comfortably.  Patient becomes visibly dyspneic with minimal exertion such as removing his shoes. CV: Tachycardic. Regular rhythm with no murmurs appreciated Pulm: Normal work of breathing at rest. Clear to auscultation bilaterally with no crackles, wheezes, or rhonchi GI: Normal bowel sounds present. Soft, Nontender, Nondistended. MSK: 3+ pitting edema to knees bilaterally. Skin: Warm, dry Neuro: Grossly normal, moves all extremities Psych: Normal affect and thought content  EKG: Sinus tachycardia.  Right bundle branch block. T wave inversion and V1 through V4.      Patient has a HIGH level of medical complexity due to number of diagnoses/treatment options and risk of morbidity/mortality.   Algis Greenhouse. Jerline Pain, MD 04/22/2018 3:18 PM

## 2018-04-22 NOTE — ED Notes (Signed)
Pt given sandwich and ginger ale, OK per MD Mesner

## 2018-04-22 NOTE — Patient Instructions (Signed)
I am concerned that you could be having a blockage in your heart. I would like for you to go straight to the ED to have blood work done.  They can also check for potential blood clots.

## 2018-04-23 ENCOUNTER — Other Ambulatory Visit: Payer: Self-pay

## 2018-04-23 DIAGNOSIS — C189 Malignant neoplasm of colon, unspecified: Secondary | ICD-10-CM

## 2018-04-23 DIAGNOSIS — C787 Secondary malignant neoplasm of liver and intrahepatic bile duct: Secondary | ICD-10-CM

## 2018-04-23 DIAGNOSIS — G8929 Other chronic pain: Secondary | ICD-10-CM

## 2018-04-23 DIAGNOSIS — K6389 Other specified diseases of intestine: Secondary | ICD-10-CM

## 2018-04-23 DIAGNOSIS — K639 Disease of intestine, unspecified: Secondary | ICD-10-CM

## 2018-04-23 DIAGNOSIS — F17210 Nicotine dependence, cigarettes, uncomplicated: Secondary | ICD-10-CM

## 2018-04-23 DIAGNOSIS — M549 Dorsalgia, unspecified: Secondary | ICD-10-CM

## 2018-04-23 DIAGNOSIS — R160 Hepatomegaly, not elsewhere classified: Secondary | ICD-10-CM | POA: Diagnosis present

## 2018-04-23 DIAGNOSIS — C180 Malignant neoplasm of cecum: Secondary | ICD-10-CM

## 2018-04-23 DIAGNOSIS — R600 Localized edema: Secondary | ICD-10-CM

## 2018-04-23 LAB — CBC WITH DIFFERENTIAL/PLATELET
Abs Immature Granulocytes: 0.13 10*3/uL — ABNORMAL HIGH (ref 0.00–0.07)
BASOS PCT: 0 %
Basophils Absolute: 0 10*3/uL (ref 0.0–0.1)
Eosinophils Absolute: 0 10*3/uL (ref 0.0–0.5)
Eosinophils Relative: 0 %
HCT: 35.4 % — ABNORMAL LOW (ref 39.0–52.0)
Hemoglobin: 11.4 g/dL — ABNORMAL LOW (ref 13.0–17.0)
Immature Granulocytes: 1 %
Lymphocytes Relative: 10 %
Lymphs Abs: 1.1 10*3/uL (ref 0.7–4.0)
MCH: 30.2 pg (ref 26.0–34.0)
MCHC: 32.2 g/dL (ref 30.0–36.0)
MCV: 93.7 fL (ref 80.0–100.0)
Monocytes Absolute: 0.9 10*3/uL (ref 0.1–1.0)
Monocytes Relative: 8 %
Neutro Abs: 8.6 10*3/uL — ABNORMAL HIGH (ref 1.7–7.7)
Neutrophils Relative %: 81 %
Platelets: 204 10*3/uL (ref 150–400)
RBC: 3.78 MIL/uL — ABNORMAL LOW (ref 4.22–5.81)
RDW: 16.5 % — ABNORMAL HIGH (ref 11.5–15.5)
WBC: 10.8 10*3/uL — AB (ref 4.0–10.5)
nRBC: 0 % (ref 0.0–0.2)

## 2018-04-23 LAB — COMPREHENSIVE METABOLIC PANEL
ALT: 42 U/L (ref 0–44)
ANION GAP: 12 (ref 5–15)
AST: 86 U/L — ABNORMAL HIGH (ref 15–41)
Albumin: 2.4 g/dL — ABNORMAL LOW (ref 3.5–5.0)
Alkaline Phosphatase: 298 U/L — ABNORMAL HIGH (ref 38–126)
BUN: 8 mg/dL (ref 8–23)
CO2: 24 mmol/L (ref 22–32)
Calcium: 7.7 mg/dL — ABNORMAL LOW (ref 8.9–10.3)
Chloride: 104 mmol/L (ref 98–111)
Creatinine, Ser: 0.88 mg/dL (ref 0.61–1.24)
GFR calc Af Amer: >60 mL/min (ref >60)
GFR calc non Af Amer: >60 mL/min (ref >60)
Glucose, Bld: 112 mg/dL — ABNORMAL HIGH (ref 70–99)
Potassium: 3.7 mmol/L (ref 3.5–5.1)
Sodium: 140 mmol/L (ref 135–145)
Total Bilirubin: 1.3 mg/dL — ABNORMAL HIGH (ref 0.3–1.2)
Total Protein: 5.7 g/dL — ABNORMAL LOW (ref 6.5–8.1)

## 2018-04-23 LAB — HIV ANTIBODY (ROUTINE TESTING W REFLEX): HIV Screen 4th Generation wRfx: NONREACTIVE

## 2018-04-23 LAB — GLUCOSE, CAPILLARY: Glucose-Capillary: 102 mg/dL — ABNORMAL HIGH (ref 70–99)

## 2018-04-23 MED ORDER — PEG-KCL-NACL-NASULF-NA ASC-C 100 G PO SOLR
0.5000 | Freq: Once | ORAL | Status: AC
  Administered 2018-04-24: 100 g via ORAL
  Filled 2018-04-23: qty 1

## 2018-04-23 MED ORDER — PEG-KCL-NACL-NASULF-NA ASC-C 100 G PO SOLR
0.5000 | Freq: Once | ORAL | Status: AC
  Administered 2018-04-23: 100 g via ORAL
  Filled 2018-04-23 (×2): qty 1

## 2018-04-23 MED ORDER — ENOXAPARIN SODIUM 40 MG/0.4ML ~~LOC~~ SOLN: 40.0000 mg | SUBCUTANEOUS | Status: DC

## 2018-04-23 MED ORDER — PEG-KCL-NACL-NASULF-NA ASC-C 100 G PO SOLR: 1.0000 | Freq: Once | ORAL | Status: DC

## 2018-04-23 NOTE — Progress Notes (Signed)
PROGRESS NOTE    Thomas Riley  CBS:[id number] DOB: 1955/05/25 DOA: 04/22/2018 PCP: Vivi Barrack, MD    Brief Narrative:  63 year old male who presented with leg edema.  He does have significant past medical history for tobacco abuse, and chronic lower back pain. Reported iInsidious worsening of his lower back pain over the last 2 months, with radiation to bilateral lower extremities.  Recently he had noticed bilateral lower extremity edema that has been associated with exertional dyspnea, and night sweats.  No chest pain or angina.  His symptoms were refractive to steroids and furosemide.  His initial physical examination blood pressure 131/70, respiratory rate 13, moist mucous membranes, lungs clear to auscultation bilaterally, heart S1-S2 present and rhythmic, abdomen mildly distended, no guarding, no tenderness, positive lower extremity edema bilaterally.  Sodium was 138, potassium 4.1, chloride 102, bicarb 21, glucose 122, BUN 9, creatinine 0.90, white count 15.2, hemoglobin 12.9, hematocrit 39.7, platelets 319.  Chest CT was negative for pulmonary embolism.  He had scattered bilateral noncalcified pulmonary nodules measuring up to 4.7 mm.  CT of the abdomen and pelvis showed a 4.3 x 3.1 x 4.7 cm exophytic enhancing mass within the right lower quadrant, intimately associated with an adjacent loop of ileum as well as cecum.  Diffusely enlarged liver with innumerable hypodense lesions.  13 mm mesenteric lymph node within the right lower quadrant concerning for nodal metastasis.  15 mm left adrenal nodule.  EKG had sinus rhythm, left axis deviation, incomplete right bundle branch block, septal T wave inversions.  The patient was admitted to the hospital with a working diagnosis of newly diagnosed colonic and liver masses.   Assessment & Plan:   Principal Problem:   Metastatic disease ([place]) Active Problems:   Pulmonary nodules   Liver lesion   Intestinal mass   Anasarca   1. Colonic mass  with liver lesions and mesenteric lymphadenopathy. Very high pretest probability for colon cancer, case discussed with Dr. Ammie Dalton from oncology. GI consulted for possible colonoscopy, will need Port placement. US guided liver biopsy has been ordered. AST 68 and ALT 42, check INR in am.    2. Lower extremity edema/ with anasarca. Will continue supportive therapy, possible decreased oncotic pressure due to hypoproteinemia. Will consult nutrition.   3. Reactive leukocytosis. No signs of infection, will continue to hold on antibiotic therapy and will follow on cell count in am. Patient was on systemic steroids as outpatient. \  4. GERD. Continue proton pump inhibitors.    DVT prophylaxis: scd   Code Status:  full Family Communication:  Disposition Plan/ discharge barriers: pending GI workup.   Body mass index is 25.8 kg/m. Malnutrition Type:      Malnutrition Characteristics:      Nutrition Interventions:     RN Pressure Injury Documentation:     Consultants:   Oncology   GI   Procedures:     Antimicrobials:       Subjective: Patient continue to have lower extremity edema, no chest pain or dyspnea, no nausea or vomiting.   Objective: Vitals:   04/22/18 2345 04/23/18 0100 04/23/18 0132 04/23/18 0451  BP: 112/74 113/64 127/66 128/70  Pulse:   91 93  Resp: 20 16 18 16   Temp:   98.2 F (36.8 C) 98.1 F (36.7 C)  TempSrc:   Oral Oral  SpO2:   98% 100%  Weight:   84.2 kg 83.9 kg  Height:   5\' 11"  (1.803 m)    No intake or  output data in the 24 hours ending 04/23/18 1104 Filed Weights   04/22/18 1547 04/23/18 0132 04/23/18 0451  Weight: 86.2 kg 84.2 kg 83.9 kg    Examination:   General: Not in pain or dyspnea, deconditioned  Neurology: Awake and alert, non focal  E ENT: mild pallor, no icterus, oral mucosa moist Cardiovascular: No JVD. S1-S2 present, rhythmic, no gallops, rubs, or murmurs. ++/+++ pitting lower extremity edema, more left than  right. Pulmonary: positive breath sounds bilaterally, adequate air movement, no wheezing, rhonchi or rales. Gastrointestinal. Abdomen with no organomegaly, non tender, no rebound or guarding Skin. No rashes Musculoskeletal: no joint deformities     Data Reviewed: I have personally reviewed following labs and imaging studies  CBC: Recent Labs  Lab 04/22/18 1556 04/23/18 0309  WBC 15.2* 10.8*  NEUTROABS  --  8.6*  HGB 12.9* 11.4*  HCT 39.7 35.4*  MCV 95.2 93.7  PLT 319 264   Basic Metabolic Panel: Recent Labs  Lab 04/22/18 1556 04/23/18 0309  NA 138 140  K 4.1 3.7  CL 102 104  CO2 21* 24  GLUCOSE 122* 112*  BUN 9 8  CREATININE 0.90 0.88  CALCIUM 8.1* 7.7*   GFR: Estimated Creatinine Clearance: 91.5 mL/min (by C-G formula based on SCr of 0.88 mg/dL). Liver Function Tests: Recent Labs  Lab 04/22/18 1905 04/23/18 0309  AST 93* 86*  ALT 53* 42  ALKPHOS 343* 298*  BILITOT 1.3* 1.3*  PROT 7.2 5.7*  ALBUMIN 2.9* 2.4*   No results for input(s): LIPASE, AMYLASE in the last 168 hours. No results for input(s): AMMONIA in the last 168 hours. Coagulation Profile: No results for input(s): INR, PROTIME in the last 168 hours. Cardiac Enzymes: No results for input(s): CKTOTAL, CKMB, CKMBINDEX, TROPONINI in the last 168 hours. BNP (last 3 results) No results for input(s): PROBNP in the last 8760 hours. HbA1C: No results for input(s): HGBA1C in the last 72 hours. CBG: Recent Labs  Lab 04/23/18 0817  GLUCAP 102*   Lipid Profile: No results for input(s): CHOL, HDL, LDLCALC, TRIG, CHOLHDL, LDLDIRECT in the last 72 hours. Thyroid Function Tests: No results for input(s): TSH, T4TOTAL, FREET4, T3FREE, THYROIDAB in the last 72 hours. Anemia Panel: No results for input(s): VITAMINB12, FOLATE, FERRITIN, TIBC, IRON, RETICCTPCT in the last 72 hours.    Radiology Studies: I have reviewed all of the imaging during this hospital visit personally     Scheduled Meds: .  enoxaparin (LOVENOX) injection  40 mg Subcutaneous Q24H  . sodium chloride flush  3 mL Intravenous Q12H  . sodium chloride flush  3 mL Intravenous Q12H   Continuous Infusions: . sodium chloride       LOS: 1 day        Mauricio Gerome Apley, MD

## 2018-04-23 NOTE — Consult Note (Signed)
Chief Complaint: Patient was seen in consultation today for image guided liver lesion biopsy and Port-A-Cath placement Chief Complaint  Patient presents with  . Leg Swelling  . Abnormal ECG     Referring Physician(s): Sherrill,B  Supervising Physician: Sandi Mariscal  Patient Status: [facility] - In-pt  History of Present Illness: Thomas Riley is a 63 y.o. male smoker who was recently admitted to [facility] with bilateral lower extremity swelling, dyspnea, and back pain.  Subsequent imaging revealed no PE but scattered bilateral pulmonary nodules, right lower quadrant/?cecal mass, liver lesions, enlarged mesenteric lymph node and  left adrenal nodule.  Bilateral lower extremity venous Doppler revealed no DVT.  Request now received from oncology for liver lesion biopsy and Port-A-Cath placement.  Past Medical History:  Diagnosis Date  . GERD (gastroesophageal reflux disease)     History reviewed. No pertinent surgical history.  Allergies: Patient has no known allergies.  Medications: Prior to Admission medications   Medication Sig Start Date End Date Taking? Authorizing Provider  acetaminophen (TYLENOL) 325 MG tablet Take 325-650 mg by mouth every 8 (eight) hours as needed for headache.   Yes [provider]  furosemide (LASIX) 20 MG tablet Take 10 mg by mouth daily.  04/19/18  Yes [provider]  naproxen (NAPROSYN) 500 MG tablet Take 500 mg by mouth 2 (two) times daily.  04/19/18  Yes [provider]  omeprazole (PRILOSEC) 20 MG capsule Take 20 mg by mouth daily.  04/13/18  Yes [provider]  predniSONE (DELTASONE) 10 MG tablet Take 10-40 mg by mouth as directed. Take 40 mg by mouth once a day for 3 days, 30 mg once a day for 3 days, 20 mg once a day for 3 days, then 10 mg once a day for 3 days   Yes [provider]     Family History  Problem Relation Age of Onset  . COPD Mother   . Heart disease Father   . COPD Brother    . Parkinson's disease Brother   . Cancer Neg Hx     Social History   Socioeconomic History  . Marital status: Married    Spouse name: Not on file  . Number of children: Not on file  . Years of education: Not on file  . Highest education level: Not on file  Occupational History  . Not on file  Social Needs  . Financial resource strain: Not on file  . Food insecurity:    Worry: Not on file    Inability: Not on file  . Transportation needs:    Medical: Not on file    Non-medical: Not on file  Tobacco Use  . Smoking status: Current Some Day Smoker    Packs/day: 0.30    Types: Cigarettes  . Smokeless tobacco: Never Used  Substance and Sexual Activity  . Alcohol use: Yes    Alcohol/week: 12.0 standard drinks    Types: 12 Cans of beer per week  . Drug use: Never  . Sexual activity: Not on file  Lifestyle  . Physical activity:    Days per week: Not on file    Minutes per session: Not on file  . Stress: Not on file  Relationships  . Social connections:    Talks on phone: Not on file    Gets together: Not on file    Attends religious service: Not on file    Active member of club or organization: Not on file  Attends meetings of clubs or organizations: Not on file    Relationship status: Not on file  Other Topics Concern  . Not on file  Social History Narrative  . Not on file      Review of Systems see above; currently denies fever, headache, chest pain, dyspnea, nausea, vomiting or bleeding.  Has had some constipation.  Vital Signs: BP 128/70 (BP Location: Left Arm)   Pulse 93   Temp 98.1 F (36.7 C) (Oral)   Resp 16   Ht 5\' 11"  (1.803 m)   Wt 184 lb 15.5 oz (83.9 kg)   SpO2 100%   BMI 25.80 kg/m   Physical Exam awake, alert.  Chest with slightly diminished breath sounds right base, left clear.  Heart with regular rate and rhythm.  Abdomen soft, positive bowel sounds, hepatomegaly noted.  Left greater than right lower extremity edema.  Imaging: Dg  Chest 2 View  Result Date: 04/22/2018 CLINICAL DATA:  Feet and ankle swelling. EXAM: CHEST - 2 VIEW COMPARISON:  No prior. FINDINGS: Mediastinum hilar structures normal. Heart size normal. Mild right upper lobe infiltrate. Mild bibasilar atelectasis. Follow-up chest x-ray to demonstrate clearing suggested. No pleural effusion or pneumothorax. IMPRESSION: Mild right upper lobe infiltrate. Mild bibasilar atelectasis. Follow-up chest x-rays to demonstrate clearing suggested. Followup PA and lateral chest X-ray is recommended in 3-4 weeks following trial of antibiotic therapy to ensure resolution and exclude underlying malignancy. Electronically Signed   By: Marcello Moores  Register   On: 04/22/2018 16:30   Ct Angio Chest Pe W And/or Wo Contrast  Result Date: 04/22/2018 CLINICAL DATA:  Dyspnea ongoing for 2-3 weeks. Bilateral lower extremity swelling onset 5 days ago. EXAM: CT ANGIOGRAPHY CHEST WITH CONTRAST TECHNIQUE: Multidetector CT imaging of the chest was performed using the standard protocol during bolus administration of intravenous contrast. Multiplanar CT image reconstructions and MIPs were obtained to evaluate the vascular anatomy. CONTRAST:  55 cc ISOVUE-370 IOPAMIDOL (ISOVUE-370) INJECTION 76% COMPARISON:  CXR 04/22/2018 FINDINGS: Cardiovascular: Conventional branch pattern of the great vessels with atherosclerosis of the left subclavian artery. Nonaneurysmal minimally atherosclerotic thoracic aorta. No dissection. Satisfactory opacification of the pulmonary arteries to the segmental level without acute pulmonary embolus. Heart size is top normal. No pericardial effusion or thickening. No significant calcific coronary arteriosclerosis. Mediastinum/Nodes: No enlarged mediastinal, hilar, or axillary lymph nodes. Thyroid gland, trachea, and esophagus demonstrate no significant findings. Lungs/Pleura: Scattered noncalcified pulmonary nodules are identified bilaterally, measuring up to 4.7 mm. Two of these are seen  in the right upper lobe anterior segment (4.6 mm each) series 6/71, one in the right middle lobe (4.7 mm in average), image 68, one in the left upper lobe (4.6 mm) image 72 and a 3 mm nodule in the right lower lobe, image 86. Dependent bibasilar atelectasis is noted. No pneumothorax or pulmonary consolidations. Trachea and mainstem bronchi appear patent. Upper Abdomen: The liver is enlarged and in homogeneous in appearance. Subtle underlying hypodense masses are not excluded but suboptimally characterized on this exam. A calcified granuloma is seen in the left hepatic lobe. The included left adrenal gland is unremarkable. The right adrenal gland is excluded. Musculoskeletal: No chest wall mass. No aggressive osseous lesion of the bony thorax. No acute fracture. Review of the MIP images confirms the above findings. IMPRESSION: 1. No acute pulmonary embolus, aortic aneurysm or dissection. 2. Scattered bilateral noncalcified pulmonary nodules measuring up to 4.7 mm. This in conjunction with an enlarged heterogeneous, abnormal appearance of the liver with subtle hypodense masslike abnormalities  within raise concern for possible metastatic disease. Dedicated CT of the abdomen and pelvis with IV contrast is recommended for further correlation. Aortic Atherosclerosis (ICD10-I70.0). Electronically Signed   By: Ashley Royalty M.D.   On: 04/22/2018 19:01   Ct Abdomen Pelvis W Contrast  Result Date: 04/22/2018 CLINICAL DATA:  Initial evaluation for acute abdominal distension. EXAM: CT ABDOMEN AND PELVIS WITH CONTRAST TECHNIQUE: Multidetector CT imaging of the abdomen and pelvis was performed using the standard protocol following bolus administration of intravenous contrast. CONTRAST:  170mL OMNIPAQUE IOHEXOL 300 MG/ML  SOLN COMPARISON:  Prior CTA from earlier the same day. FINDINGS: Lower chest: Mild scattered subsegmental atelectatic changes present within the lung bases. 5 mm nodule within the lower right lung, better  evaluated on recent chest CT. Small pericardial effusion. Hepatobiliary: Liver is diffusely enlarged with innumerable hypodense lesions scattered throughout both the right and left hepatic lobes, highly concerning for widespread hepatic metastases. Liver demonstrates a abnormal nodular contour or. For reference purposes, the dominant lesion position within the central aspect of the liver measures approximately 7.3 x 6.2 cm (series 3, image 40). Partially distended gallbladder without acute abnormality. Trace pericholecystic fluid noted, favored to be related intrinsic liver disease. No biliary dilatation. Pancreas: Pancreas within normal limits. Spleen: Spleen within normal limits. Adrenals/Urinary Tract: 15 mm left adrenal nodule, indeterminate. Right adrenal gland grossly unremarkable, although poorly visualized due to mass effect by the adjacent liver. Kidneys fairly equal in size with symmetric enhancement. Subcentimeter hypodensity at the lower pole of the right kidney too small the characterize. No visible nephrolithiasis or hydronephrosis. No focal enhancing renal mass. Contrast material within the renal collecting systems bilaterally. No hydroureter. Partially distended bladder without acute abnormality. Secreted IV contrast material within the bladder lumen. Stomach/Bowel: Stomach displaced towards the left upper quadrant by the enlarged liver, but otherwise unremarkable without acute finding. No evidence for small bowel obstruction. There is an irregular exophytic enhancing mass within the right lower quadrant measuring approximately 4.3 x 3.1 x 4.7 cm (series 3, image 66). Mass is intimately associated with an adjacent loop of ileum as well as the cecum, making it not entirely clear from which the lesion is arising from, although cecal origin is favored. Additionally, the appendix is not definitely visualized, raising the possibility for an appendiceal origin which could be considered as well. Surrounding  inflammatory stranding and induration within the adjacent mesenteric fat. No other acute inflammatory changes about the bowels. Few scattered colonic diverticula noted. Vascular/Lymphatic: Normal intravascular enhancement seen throughout the intra-abdominal aorta. Mild to moderate aorto bi-iliac atherosclerotic disease. No aneurysm. Mesenteric vessels patent proximally. Enlarged 13 mm mesenteric lymph node within the right lower quadrant, concerning for nodal metastasis (series 3, image 66). No other pathologically enlarged lymph nodes identified within the abdomen and pelvis. Reproductive: Prostate normal. Other: Small volume mildly complex free fluid within the pelvis. No free intraperitoneal air. Musculoskeletal: Mild diffuse anasarca within the external soft tissues, likely related to intrinsic liver disease. No acute osseous abnormality. No discrete lytic or blastic osseous lesions. IMPRESSION: 1. 4.3 x 3.1 x 4.7 cm exophytic enhancing mass within the right lower quadrant, intimately associated with an adjacent loop of ileum as well as the cecum, making it not entirely clear from which the lesion is arising from, although cecal origin is perhaps favored. Additionally, the appendix is not definitely visualized, raising the possibility for an appendiceal origin which could be considered as well. 2. Diffusely enlarged liver with innumerable hypodense lesions, concerning for widespread hepatic  metastases. 3. Enlarged 13 mm mesenteric lymph node within the right lower quadrant, concerning for nodal metastasis. 4. 15 mm left adrenal nodule, indeterminate. 5. 5 mm right lower lobe pulmonary nodule, better evaluated on recent chest CT, but concerning for metastatic disease. 6. Small volume free fluid within the pelvis with mild diffuse anasarca. Electronically Signed   By: Jeannine Boga M.D.   On: 04/22/2018 20:47   Vas [place] Lower Extremity Venous (dvt) (only [facility])  Result Date: 04/22/2018  Lower Venous  Study Indications: Pitting edema.  Performing Technologist: June Leap RDMS, RVT  Examination Guidelines: A complete evaluation includes B-mode imaging, spectral Doppler, color Doppler, and power Doppler as needed of all accessible portions of each vessel. Bilateral testing is considered an integral part of a complete examination. Limited examinations for reoccurring indications may be performed as noted.  Right Venous Findings: +---------+---------------+---------+-----------+----------+-------+          CompressibilityPhasicitySpontaneityPropertiesSummary +---------+---------------+---------+-----------+----------+-------+ CFV      Full           Yes      Yes                          +---------+---------------+---------+-----------+----------+-------+ SFJ      Full                                                 +---------+---------------+---------+-----------+----------+-------+ FV Prox  Full                                                 +---------+---------------+---------+-----------+----------+-------+ FV Mid   Full                                                 +---------+---------------+---------+-----------+----------+-------+ FV DistalFull                                                 +---------+---------------+---------+-----------+----------+-------+ PFV      Full                                                 +---------+---------------+---------+-----------+----------+-------+ POP      Full           Yes      Yes                          +---------+---------------+---------+-----------+----------+-------+ PTV      Full                                                 +---------+---------------+---------+-----------+----------+-------+ PERO     Full                                                 +---------+---------------+---------+-----------+----------+-------+  Left Venous Findings:  +---------+---------------+---------+-----------+----------+-------+          CompressibilityPhasicitySpontaneityPropertiesSummary +---------+---------------+---------+-----------+----------+-------+ CFV      Full           Yes      Yes                          +---------+---------------+---------+-----------+----------+-------+ SFJ      Full                                                 +---------+---------------+---------+-----------+----------+-------+ FV Prox  Full                                                 +---------+---------------+---------+-----------+----------+-------+ FV Mid   Full                                                 +---------+---------------+---------+-----------+----------+-------+ FV DistalFull                                                 +---------+---------------+---------+-----------+----------+-------+ PFV      Full                                                 +---------+---------------+---------+-----------+----------+-------+ POP      Full           Yes      Yes                          +---------+---------------+---------+-----------+----------+-------+ PTV      Full                                                 +---------+---------------+---------+-----------+----------+-------+ PERO     Full                                                 +---------+---------------+---------+-----------+----------+-------+    Summary: Right: There is no evidence of deep vein thrombosis in the lower extremity. No cystic structure found in the popliteal fossa. Left: There is no evidence of deep vein thrombosis in the lower extremity. No cystic structure found in the popliteal fossa.  *See table(s) above for measurements and observations.    Preliminary     Labs:  CBC: Recent Labs    04/22/18 1556 04/23/18 0309  WBC 15.2* 10.8*  HGB 12.9* 11.4*  HCT 39.7 35.4*  PLT 319 204    COAGS: No results for  input(s): INR, APTT in the  last 8760 hours.  BMP: Recent Labs    04/22/18 1556 04/23/18 0309  NA 138 140  K 4.1 3.7  CL 102 104  CO2 21* 24  GLUCOSE 122* 112*  BUN 9 8  CALCIUM 8.1* 7.7*  CREATININE 0.90 0.88  GFRNONAA >60 >60  GFRAA >60 >60    LIVER FUNCTION TESTS: Recent Labs    04/22/18 1905 04/23/18 0309  BILITOT 1.3* 1.3*  AST 93* 86*  ALT 53* 42  ALKPHOS 343* 298*  PROT 7.2 5.7*  ALBUMIN 2.9* 2.4*    TUMOR MARKERS: No results for input(s): AFPTM, CEA, CA199, CHROMGRNA in the last 8760 hours.  Assessment and Plan: 63 y.o. male smoker who was recently admitted to [facility] with bilateral lower extremity swelling, dyspnea, and back pain.  Subsequent imaging revealed no PE but scattered bilateral pulmonary nodules, right lower quadrant/?cecal mass, liver lesions, enlarged mesenteric lymph node and  left adrenal nodule.  No prior hx malignancy. Bilateral lower extremity venous Doppler revealed no DVT.  Request now received from oncology for liver lesion biopsy and Port-A-Cath placement.  Imaging studies have been reviewed by Dr. Pascal Lux.  Details/risks of procedures, including but not limited to, internal bleeding, infection, injury to adjacent structures, venous thrombosis d/w pt with his understanding and consent.  Procedures tentatively scheduled for 2/3.  Will need to hold Lovenox doses on 2/2 and 2/3 until after procedures.   Thank you for this interesting consult.  I greatly enjoyed meeting Eural Holzschuh and look forward to participating in their care.  A copy of this report was sent to the requesting provider on this date.  Electronically Signed: D. Rowe Robert, PA-C 04/23/2018, 1:05 PM   I spent a total of 30 minutes  in face to face in clinical consultation, greater than 50% of which was counseling/coordinating care for image guided liver lesion biopsy and Port-A-Cath placement

## 2018-04-23 NOTE — H&P (View-Only) (Signed)
Referring Provider: [facility]   Primary Care Physician:  Vivi Barrack, MD Primary Gastroenterologist: None     Reason for Consultation:   Colon mass   ASSESSMENT / PLAN:    63 year old male with RLQ mass on CT scan.  Mass ever to arise from the cecum but unclear as it is closely associated with a loop of ileum .  Appendix not definitely seen so appendiceal origin also a consideration . There are multiple liver lesions /hepatomegaly, mesenteric lymphadenopathy, pulmonary nodules- all concerning for metastatic disease. -CEA pending -Oncology has already evaluated and requesting liver lesion bx for diagnosis and molecular testing.  -Oncology also requesting colonoscopy to confirm colon primary. Will prep today for a.m. colonoscopy. The risks and benefits of colonoscopy with possible polypectomy were discussed and the patient agrees to proceed.   BLE edema / anasarca. Compression or IVC / HV ?  Bilateral u/s negative for DVT. No hx of heart or kidney disease. BNP minimally elevated.   HPI:      HPI: Thomas Riley is a 63 y.o. male with no significant past medical history.  He presented to his PCP 04/22/2018 with DOE, BLE edema,  worsening of chronic low back pain and abnormal ECG raising concern for ACS. Also concern for VTE. Sent to ED. CT scan suggest right colon mass with metastatic disease.  Patient has never had a colonoscopy.  No recent weight loss.  No abdominal pain, nausea or vomiting, bowel changes, or blood in stool.    ED course: Tachycardic on admission Troponin normal, BNP slightly elevated. Alkaline phosphatase 343, mildly elevated transaminases, slightly elevated total bilirubin, albumin 2.9 WBC 15.2, hemoglobin 12.9 CTA of chest negative for PE but concerning for metastatic lung nodules.  CTAP demonstrating RLQ mass, probably arising from the cecum, enlarged liver with multiple hypodense lesions concerning for widespread metastatic disease.  Mesenteric  adenopathy noted WBC 15.2 Past Medical History:  Diagnosis Date  . GERD (gastroesophageal reflux disease)     History reviewed. No pertinent surgical history.  Prior to Admission medications   Medication Sig Start Date End Date Taking? Authorizing Provider  acetaminophen (TYLENOL) 325 MG tablet Take 325-650 mg by mouth every 8 (eight) hours as needed for headache.   Yes [provider]  furosemide (LASIX) 20 MG tablet Take 10 mg by mouth daily.  04/19/18  Yes [provider]  naproxen (NAPROSYN) 500 MG tablet Take 500 mg by mouth 2 (two) times daily.  04/19/18  Yes [provider]  omeprazole (PRILOSEC) 20 MG capsule Take 20 mg by mouth daily.  04/13/18  Yes [provider]  predniSONE (DELTASONE) 10 MG tablet Take 10-40 mg by mouth as directed. Take 40 mg by mouth once a day for 3 days, 30 mg once a day for 3 days, 20 mg once a day for 3 days, then 10 mg once a day for 3 days   Yes [provider]    Current Facility-Administered Medications  Medication Dose Route Frequency Provider Last Rate Last Dose  . 0.9 %  sodium chloride infusion  250 mL Intravenous PRN Opyd, Ilene Qua, MD      . enoxaparin (LOVENOX) injection 40 mg  40 mg Subcutaneous Q24H Opyd, Ilene Qua, MD   40 mg at 04/23/18 1149  . fentaNYL (SUBLIMAZE) injection 25-50 mcg  25-50 mcg Intravenous Q2H PRN Opyd, Ilene Qua, MD   50 mcg at 04/23/18 0453  . ondansetron (ZOFRAN) tablet 4 mg  4 mg Oral Q6H  PRN Opyd, Ilene Qua, MD       Or  . ondansetron (ZOFRAN) injection 4 mg  4 mg Intravenous Q6H PRN Opyd, Ilene Qua, MD      . polyethylene glycol (MIRALAX / GLYCOLAX) packet 17 g  17 g Oral Daily PRN Opyd, Ilene Qua, MD      . sodium chloride flush (NS) 0.9 % injection 3 mL  3 mL Intravenous Q12H Opyd, Ilene Qua, MD   3 mL at 04/23/18 1151  . sodium chloride flush (NS) 0.9 % injection 3 mL  3 mL Intravenous Q12H Opyd, Timothy S, MD      . sodium chloride flush (NS) 0.9 % injection 3 mL  3  mL Intravenous PRN Opyd, Ilene Qua, MD      . zolpidem (AMBIEN) tablet 5-10 mg  5-10 mg Oral QHS PRN Opyd, Ilene Qua, MD   5 mg at 04/23/18 0007    Allergies as of 04/22/2018  . (No Known Allergies)    Family History  Problem Relation Age of Onset  . COPD Mother   . Heart disease Father   . COPD Brother   . Parkinson's disease Brother   . Cancer Neg Hx     Social History   Socioeconomic History  . Marital status: Married    Spouse name: Not on file  . Number of children: Not on file  . Years of education: Not on file  . Highest education level: Not on file  Occupational History  . Not on file  Social Needs  . Financial resource strain: Not on file  . Food insecurity:    Worry: Not on file    Inability: Not on file  . Transportation needs:    Medical: Not on file    Non-medical: Not on file  Tobacco Use  . Smoking status: Current Some Day Smoker    Packs/day: 0.30    Types: Cigarettes  . Smokeless tobacco: Never Used  Substance and Sexual Activity  . Alcohol use: Yes    Alcohol/week: 12.0 standard drinks    Types: 12 Cans of beer per week  . Drug use: Never  . Sexual activity: Not on file  Lifestyle  . Physical activity:    Days per week: Not on file    Minutes per session: Not on file  . Stress: Not on file  Relationships  . Social connections:    Talks on phone: Not on file    Gets together: Not on file    Attends religious service: Not on file    Active member of club or organization: Not on file    Attends meetings of clubs or organizations: Not on file    Relationship status: Not on file  . Intimate partner violence:    Fear of current or ex partner: Not on file    Emotionally abused: Not on file    Physically abused: Not on file    Forced sexual activity: Not on file  Other Topics Concern  . Not on file  Social History Narrative  . Not on file    Review of Systems: All systems reviewed and negative except where noted in HPI.  Physical  Exam: Vital signs in last 24 hours: Temp:  [97.1 F (36.2 C)-98.2 F (36.8 C)] 98.1 F (36.7 C) (02/01 0451) Pulse Rate:  [91-110] 93 (02/01 0451) Resp:  [12-20] 16 (02/01 0451) BP: (110-137)/(59-79) 128/70 (02/01 0451) SpO2:  [94 %-100 %] 100 % (02/01 0451) Weight:  [83.9  kg-86.3 kg] 83.9 kg (02/01 0451) Last BM Date: 04/22/18 General:   Alert, well-developed,  white male in NAD Psych:  Pleasant, cooperative. Normal mood and affect. Eyes:  Pupils equal, sclera clear, no icterus.   Conjunctiva pink. Ears:  Normal auditory acuity. Nose:  No deformity, discharge,  or lesions. Neck:  Supple; no masses, ? JVD Lungs:  Clear throughout to auscultation.   No wheezes, crackles, or rhonchi.  Heart:  Regular rate and rhythm; no murmurs, no lower extremity edema Abdomen:  Soft, non-distended, nontender, BS active, no palp mass    Rectal:  Deferred  Msk:  Symmetrical without gross deformities. . Neurologic:  Alert and  oriented x4;  grossly normal neurologically. Skin:  Intact without significant lesions or rashes.   Intake/Output from previous day: No intake/output data recorded. Intake/Output this shift: No intake/output data recorded.  Lab Results: Recent Labs    04/22/18 1556 04/23/18 0309  WBC 15.2* 10.8*  HGB 12.9* 11.4*  HCT 39.7 35.4*  PLT 319 204   BMET Recent Labs    04/22/18 1556 04/23/18 0309  NA 138 140  K 4.1 3.7  CL 102 104  CO2 21* 24  GLUCOSE 122* 112*  BUN 9 8  CREATININE 0.90 0.88  CALCIUM 8.1* 7.7*   LFT Recent Labs    04/22/18 1905 04/23/18 0309  PROT 7.2 5.7*  ALBUMIN 2.9* 2.4*  AST 93* 86*  ALT 53* 42  ALKPHOS 343* 298*  BILITOT 1.3* 1.3*  BILIDIR 0.5*  --   IBILI 0.8  --      Studies/Results: Dg Chest 2 View  Result Date: 04/22/2018 CLINICAL DATA:  Feet and ankle swelling. EXAM: CHEST - 2 VIEW COMPARISON:  No prior. FINDINGS: Mediastinum hilar structures normal. Heart size normal. Mild right upper lobe infiltrate. Mild bibasilar  atelectasis. Follow-up chest x-ray to demonstrate clearing suggested. No pleural effusion or pneumothorax. IMPRESSION: Mild right upper lobe infiltrate. Mild bibasilar atelectasis. Follow-up chest x-rays to demonstrate clearing suggested. Followup PA and lateral chest X-ray is recommended in 3-4 weeks following trial of antibiotic therapy to ensure resolution and exclude underlying malignancy. Electronically Signed   By: Marcello Moores  Register   On: 04/22/2018 16:30   Ct Angio Chest Pe W And/or Wo Contrast  Result Date: 04/22/2018 CLINICAL DATA:  Dyspnea ongoing for 2-3 weeks. Bilateral lower extremity swelling onset 5 days ago. EXAM: CT ANGIOGRAPHY CHEST WITH CONTRAST TECHNIQUE: Multidetector CT imaging of the chest was performed using the standard protocol during bolus administration of intravenous contrast. Multiplanar CT image reconstructions and MIPs were obtained to evaluate the vascular anatomy. CONTRAST:  55 cc ISOVUE-370 IOPAMIDOL (ISOVUE-370) INJECTION 76% COMPARISON:  CXR 04/22/2018 FINDINGS: Cardiovascular: Conventional branch pattern of the great vessels with atherosclerosis of the left subclavian artery. Nonaneurysmal minimally atherosclerotic thoracic aorta. No dissection. Satisfactory opacification of the pulmonary arteries to the segmental level without acute pulmonary embolus. Heart size is top normal. No pericardial effusion or thickening. No significant calcific coronary arteriosclerosis. Mediastinum/Nodes: No enlarged mediastinal, hilar, or axillary lymph nodes. Thyroid gland, trachea, and esophagus demonstrate no significant findings. Lungs/Pleura: Scattered noncalcified pulmonary nodules are identified bilaterally, measuring up to 4.7 mm. Two of these are seen in the right upper lobe anterior segment (4.6 mm each) series 6/71, one in the right middle lobe (4.7 mm in average), image 68, one in the left upper lobe (4.6 mm) image 72 and a 3 mm nodule in the right lower lobe, image 86. Dependent  bibasilar atelectasis is noted. No pneumothorax or  pulmonary consolidations. Trachea and mainstem bronchi appear patent. Upper Abdomen: The liver is enlarged and in homogeneous in appearance. Subtle underlying hypodense masses are not excluded but suboptimally characterized on this exam. A calcified granuloma is seen in the left hepatic lobe. The included left adrenal gland is unremarkable. The right adrenal gland is excluded. Musculoskeletal: No chest wall mass. No aggressive osseous lesion of the bony thorax. No acute fracture. Review of the MIP images confirms the above findings. IMPRESSION: 1. No acute pulmonary embolus, aortic aneurysm or dissection. 2. Scattered bilateral noncalcified pulmonary nodules measuring up to 4.7 mm. This in conjunction with an enlarged heterogeneous, abnormal appearance of the liver with subtle hypodense masslike abnormalities within raise concern for possible metastatic disease. Dedicated CT of the abdomen and pelvis with IV contrast is recommended for further correlation. Aortic Atherosclerosis (ICD10-I70.0). Electronically Signed   By: Ashley Royalty M.D.   On: 04/22/2018 19:01   Ct Abdomen Pelvis W Contrast  Result Date: 04/22/2018 CLINICAL DATA:  Initial evaluation for acute abdominal distension. EXAM: CT ABDOMEN AND PELVIS WITH CONTRAST TECHNIQUE: Multidetector CT imaging of the abdomen and pelvis was performed using the standard protocol following bolus administration of intravenous contrast. CONTRAST:  115mL OMNIPAQUE IOHEXOL 300 MG/ML  SOLN COMPARISON:  Prior CTA from earlier the same day. FINDINGS: Lower chest: Mild scattered subsegmental atelectatic changes present within the lung bases. 5 mm nodule within the lower right lung, better evaluated on recent chest CT. Small pericardial effusion. Hepatobiliary: Liver is diffusely enlarged with innumerable hypodense lesions scattered throughout both the right and left hepatic lobes, highly concerning for widespread hepatic  metastases. Liver demonstrates a abnormal nodular contour or. For reference purposes, the dominant lesion position within the central aspect of the liver measures approximately 7.3 x 6.2 cm (series 3, image 40). Partially distended gallbladder without acute abnormality. Trace pericholecystic fluid noted, favored to be related intrinsic liver disease. No biliary dilatation. Pancreas: Pancreas within normal limits. Spleen: Spleen within normal limits. Adrenals/Urinary Tract: 15 mm left adrenal nodule, indeterminate. Right adrenal gland grossly unremarkable, although poorly visualized due to mass effect by the adjacent liver. Kidneys fairly equal in size with symmetric enhancement. Subcentimeter hypodensity at the lower pole of the right kidney too small the characterize. No visible nephrolithiasis or hydronephrosis. No focal enhancing renal mass. Contrast material within the renal collecting systems bilaterally. No hydroureter. Partially distended bladder without acute abnormality. Secreted IV contrast material within the bladder lumen. Stomach/Bowel: Stomach displaced towards the left upper quadrant by the enlarged liver, but otherwise unremarkable without acute finding. No evidence for small bowel obstruction. There is an irregular exophytic enhancing mass within the right lower quadrant measuring approximately 4.3 x 3.1 x 4.7 cm (series 3, image 66). Mass is intimately associated with an adjacent loop of ileum as well as the cecum, making it not entirely clear from which the lesion is arising from, although cecal origin is favored. Additionally, the appendix is not definitely visualized, raising the possibility for an appendiceal origin which could be considered as well. Surrounding inflammatory stranding and induration within the adjacent mesenteric fat. No other acute inflammatory changes about the bowels. Few scattered colonic diverticula noted. Vascular/Lymphatic: Normal intravascular enhancement seen  throughout the intra-abdominal aorta. Mild to moderate aorto bi-iliac atherosclerotic disease. No aneurysm. Mesenteric vessels patent proximally. Enlarged 13 mm mesenteric lymph node within the right lower quadrant, concerning for nodal metastasis (series 3, image 66). No other pathologically enlarged lymph nodes identified within the abdomen and pelvis. Reproductive: Prostate normal. Other:  Small volume mildly complex free fluid within the pelvis. No free intraperitoneal air. Musculoskeletal: Mild diffuse anasarca within the external soft tissues, likely related to intrinsic liver disease. No acute osseous abnormality. No discrete lytic or blastic osseous lesions. IMPRESSION: 1. 4.3 x 3.1 x 4.7 cm exophytic enhancing mass within the right lower quadrant, intimately associated with an adjacent loop of ileum as well as the cecum, making it not entirely clear from which the lesion is arising from, although cecal origin is perhaps favored. Additionally, the appendix is not definitely visualized, raising the possibility for an appendiceal origin which could be considered as well. 2. Diffusely enlarged liver with innumerable hypodense lesions, concerning for widespread hepatic metastases. 3. Enlarged 13 mm mesenteric lymph node within the right lower quadrant, concerning for nodal metastasis. 4. 15 mm left adrenal nodule, indeterminate. 5. 5 mm right lower lobe pulmonary nodule, better evaluated on recent chest CT, but concerning for metastatic disease. 6. Small volume free fluid within the pelvis with mild diffuse anasarca. Electronically Signed   By: Jeannine Boga M.D.   On: 04/22/2018 20:47   Vas [place] Lower Extremity Venous (dvt) (only [facility])  Result Date: 04/22/2018  Lower Venous Study Indications: Pitting edema.  Performing Technologist: June Leap RDMS, RVT  Examination Guidelines: A complete evaluation includes B-mode imaging, spectral Doppler, color Doppler, and power Doppler as needed of all  accessible portions of each vessel. Bilateral testing is considered an integral part of a complete examination. Limited examinations for reoccurring indications may be performed as noted.  Right Venous Findings: +---------+---------------+---------+-----------+----------+-------+          CompressibilityPhasicitySpontaneityPropertiesSummary +---------+---------------+---------+-----------+----------+-------+ CFV      Full           Yes      Yes                          +---------+---------------+---------+-----------+----------+-------+ SFJ      Full                                                 +---------+---------------+---------+-----------+----------+-------+ FV Prox  Full                                                 +---------+---------------+---------+-----------+----------+-------+ FV Mid   Full                                                 +---------+---------------+---------+-----------+----------+-------+ FV DistalFull                                                 +---------+---------------+---------+-----------+----------+-------+ PFV      Full                                                 +---------+---------------+---------+-----------+----------+-------+  POP      Full           Yes      Yes                          +---------+---------------+---------+-----------+----------+-------+ PTV      Full                                                 +---------+---------------+---------+-----------+----------+-------+ PERO     Full                                                 +---------+---------------+---------+-----------+----------+-------+  Left Venous Findings: +---------+---------------+---------+-----------+----------+-------+          CompressibilityPhasicitySpontaneityPropertiesSummary +---------+---------------+---------+-----------+----------+-------+ CFV      Full           Yes      Yes                           +---------+---------------+---------+-----------+----------+-------+ SFJ      Full                                                 +---------+---------------+---------+-----------+----------+-------+ FV Prox  Full                                                 +---------+---------------+---------+-----------+----------+-------+ FV Mid   Full                                                 +---------+---------------+---------+-----------+----------+-------+ FV DistalFull                                                 +---------+---------------+---------+-----------+----------+-------+ PFV      Full                                                 +---------+---------------+---------+-----------+----------+-------+ POP      Full           Yes      Yes                          +---------+---------------+---------+-----------+----------+-------+ PTV      Full                                                 +---------+---------------+---------+-----------+----------+-------+  PERO     Full                                                 +---------+---------------+---------+-----------+----------+-------+    Summary: Right: There is no evidence of deep vein thrombosis in the lower extremity. No cystic structure found in the popliteal fossa. Left: There is no evidence of deep vein thrombosis in the lower extremity. No cystic structure found in the popliteal fossa.  *See table(s) above for measurements and observations.    Preliminary      Tye Savoy, NP-C @  04/23/2018, 12:45 PM

## 2018-04-23 NOTE — Consult Note (Signed)
Referring Provider: [facility]   Primary Care Physician:  Vivi Barrack, MD Primary Gastroenterologist: None     Reason for Consultation:   Colon mass   ASSESSMENT / PLAN:    63 year old male with RLQ mass on CT scan.  Mass ever to arise from the cecum but unclear as it is closely associated with a loop of ileum .  Appendix not definitely seen so appendiceal origin also a consideration . There are multiple liver lesions /hepatomegaly, mesenteric lymphadenopathy, pulmonary nodules- all concerning for metastatic disease. -CEA pending -Oncology has already evaluated and requesting liver lesion bx for diagnosis and molecular testing.  -Oncology also requesting colonoscopy to confirm colon primary. Will prep today for a.m. colonoscopy. The risks and benefits of colonoscopy with possible polypectomy were discussed and the patient agrees to proceed.   BLE edema / anasarca. Compression or IVC / HV ?  Bilateral u/s negative for DVT. No hx of heart or kidney disease. BNP minimally elevated.   HPI:      HPI: Thomas Riley is a 63 y.o. male with no significant past medical history.  He presented to his PCP 04/22/2018 with DOE, BLE edema,  worsening of chronic low back pain and abnormal ECG raising concern for ACS. Also concern for VTE. Sent to ED. CT scan suggest right colon mass with metastatic disease.  Patient has never had a colonoscopy.  No recent weight loss.  No abdominal pain, nausea or vomiting, bowel changes, or blood in stool.    ED course: Tachycardic on admission Troponin normal, BNP slightly elevated. Alkaline phosphatase 343, mildly elevated transaminases, slightly elevated total bilirubin, albumin 2.9 WBC 15.2, hemoglobin 12.9 CTA of chest negative for PE but concerning for metastatic lung nodules.  CTAP demonstrating RLQ mass, probably arising from the cecum, enlarged liver with multiple hypodense lesions concerning for widespread metastatic disease.  Mesenteric  adenopathy noted WBC 15.2 Past Medical History:  Diagnosis Date  . GERD (gastroesophageal reflux disease)     History reviewed. No pertinent surgical history.  Prior to Admission medications   Medication Sig Start Date End Date Taking? Authorizing Provider  acetaminophen (TYLENOL) 325 MG tablet Take 325-650 mg by mouth every 8 (eight) hours as needed for headache.   Yes [provider]  furosemide (LASIX) 20 MG tablet Take 10 mg by mouth daily.  04/19/18  Yes [provider]  naproxen (NAPROSYN) 500 MG tablet Take 500 mg by mouth 2 (two) times daily.  04/19/18  Yes [provider]  omeprazole (PRILOSEC) 20 MG capsule Take 20 mg by mouth daily.  04/13/18  Yes [provider]  predniSONE (DELTASONE) 10 MG tablet Take 10-40 mg by mouth as directed. Take 40 mg by mouth once a day for 3 days, 30 mg once a day for 3 days, 20 mg once a day for 3 days, then 10 mg once a day for 3 days   Yes [provider]    Current Facility-Administered Medications  Medication Dose Route Frequency Provider Last Rate Last Dose  . 0.9 %  sodium chloride infusion  250 mL Intravenous PRN Opyd, Ilene Qua, MD      . enoxaparin (LOVENOX) injection 40 mg  40 mg Subcutaneous Q24H Opyd, Ilene Qua, MD   40 mg at 04/23/18 1149  . fentaNYL (SUBLIMAZE) injection 25-50 mcg  25-50 mcg Intravenous Q2H PRN Opyd, Ilene Qua, MD   50 mcg at 04/23/18 0453  . ondansetron (ZOFRAN) tablet 4 mg  4 mg Oral Q6H  PRN Opyd, Ilene Qua, MD       Or  . ondansetron (ZOFRAN) injection 4 mg  4 mg Intravenous Q6H PRN Opyd, Ilene Qua, MD      . polyethylene glycol (MIRALAX / GLYCOLAX) packet 17 g  17 g Oral Daily PRN Opyd, Ilene Qua, MD      . sodium chloride flush (NS) 0.9 % injection 3 mL  3 mL Intravenous Q12H Opyd, Ilene Qua, MD   3 mL at 04/23/18 1151  . sodium chloride flush (NS) 0.9 % injection 3 mL  3 mL Intravenous Q12H Opyd, Timothy S, MD      . sodium chloride flush (NS) 0.9 % injection 3 mL  3  mL Intravenous PRN Opyd, Ilene Qua, MD      . zolpidem (AMBIEN) tablet 5-10 mg  5-10 mg Oral QHS PRN Opyd, Ilene Qua, MD   5 mg at 04/23/18 0007    Allergies as of 04/22/2018  . (No Known Allergies)    Family History  Problem Relation Age of Onset  . COPD Mother   . Heart disease Father   . COPD Brother   . Parkinson's disease Brother   . Cancer Neg Hx     Social History   Socioeconomic History  . Marital status: Married    Spouse name: Not on file  . Number of children: Not on file  . Years of education: Not on file  . Highest education level: Not on file  Occupational History  . Not on file  Social Needs  . Financial resource strain: Not on file  . Food insecurity:    Worry: Not on file    Inability: Not on file  . Transportation needs:    Medical: Not on file    Non-medical: Not on file  Tobacco Use  . Smoking status: Current Some Day Smoker    Packs/day: 0.30    Types: Cigarettes  . Smokeless tobacco: Never Used  Substance and Sexual Activity  . Alcohol use: Yes    Alcohol/week: 12.0 standard drinks    Types: 12 Cans of beer per week  . Drug use: Never  . Sexual activity: Not on file  Lifestyle  . Physical activity:    Days per week: Not on file    Minutes per session: Not on file  . Stress: Not on file  Relationships  . Social connections:    Talks on phone: Not on file    Gets together: Not on file    Attends religious service: Not on file    Active member of club or organization: Not on file    Attends meetings of clubs or organizations: Not on file    Relationship status: Not on file  . Intimate partner violence:    Fear of current or ex partner: Not on file    Emotionally abused: Not on file    Physically abused: Not on file    Forced sexual activity: Not on file  Other Topics Concern  . Not on file  Social History Narrative  . Not on file    Review of Systems: All systems reviewed and negative except where noted in HPI.  Physical  Exam: Vital signs in last 24 hours: Temp:  [97.1 F (36.2 C)-98.2 F (36.8 C)] 98.1 F (36.7 C) (02/01 0451) Pulse Rate:  [91-110] 93 (02/01 0451) Resp:  [12-20] 16 (02/01 0451) BP: (110-137)/(59-79) 128/70 (02/01 0451) SpO2:  [94 %-100 %] 100 % (02/01 0451) Weight:  [83.9  kg-86.3 kg] 83.9 kg (02/01 0451) Last BM Date: 04/22/18 General:   Alert, well-developed,  white male in NAD Psych:  Pleasant, cooperative. Normal mood and affect. Eyes:  Pupils equal, sclera clear, no icterus.   Conjunctiva pink. Ears:  Normal auditory acuity. Nose:  No deformity, discharge,  or lesions. Neck:  Supple; no masses, ? JVD Lungs:  Clear throughout to auscultation.   No wheezes, crackles, or rhonchi.  Heart:  Regular rate and rhythm; no murmurs, no lower extremity edema Abdomen:  Soft, non-distended, nontender, BS active, no palp mass    Rectal:  Deferred  Msk:  Symmetrical without gross deformities. . Neurologic:  Alert and  oriented x4;  grossly normal neurologically. Skin:  Intact without significant lesions or rashes.   Intake/Output from previous day: No intake/output data recorded. Intake/Output this shift: No intake/output data recorded.  Lab Results: Recent Labs    04/22/18 1556 04/23/18 0309  WBC 15.2* 10.8*  HGB 12.9* 11.4*  HCT 39.7 35.4*  PLT 319 204   BMET Recent Labs    04/22/18 1556 04/23/18 0309  NA 138 140  K 4.1 3.7  CL 102 104  CO2 21* 24  GLUCOSE 122* 112*  BUN 9 8  CREATININE 0.90 0.88  CALCIUM 8.1* 7.7*   LFT Recent Labs    04/22/18 1905 04/23/18 0309  PROT 7.2 5.7*  ALBUMIN 2.9* 2.4*  AST 93* 86*  ALT 53* 42  ALKPHOS 343* 298*  BILITOT 1.3* 1.3*  BILIDIR 0.5*  --   IBILI 0.8  --      Studies/Results: Dg Chest 2 View  Result Date: 04/22/2018 CLINICAL DATA:  Feet and ankle swelling. EXAM: CHEST - 2 VIEW COMPARISON:  No prior. FINDINGS: Mediastinum hilar structures normal. Heart size normal. Mild right upper lobe infiltrate. Mild bibasilar  atelectasis. Follow-up chest x-ray to demonstrate clearing suggested. No pleural effusion or pneumothorax. IMPRESSION: Mild right upper lobe infiltrate. Mild bibasilar atelectasis. Follow-up chest x-rays to demonstrate clearing suggested. Followup PA and lateral chest X-ray is recommended in 3-4 weeks following trial of antibiotic therapy to ensure resolution and exclude underlying malignancy. Electronically Signed   By: Marcello Moores  Register   On: 04/22/2018 16:30   Ct Angio Chest Pe W And/or Wo Contrast  Result Date: 04/22/2018 CLINICAL DATA:  Dyspnea ongoing for 2-3 weeks. Bilateral lower extremity swelling onset 5 days ago. EXAM: CT ANGIOGRAPHY CHEST WITH CONTRAST TECHNIQUE: Multidetector CT imaging of the chest was performed using the standard protocol during bolus administration of intravenous contrast. Multiplanar CT image reconstructions and MIPs were obtained to evaluate the vascular anatomy. CONTRAST:  55 cc ISOVUE-370 IOPAMIDOL (ISOVUE-370) INJECTION 76% COMPARISON:  CXR 04/22/2018 FINDINGS: Cardiovascular: Conventional branch pattern of the great vessels with atherosclerosis of the left subclavian artery. Nonaneurysmal minimally atherosclerotic thoracic aorta. No dissection. Satisfactory opacification of the pulmonary arteries to the segmental level without acute pulmonary embolus. Heart size is top normal. No pericardial effusion or thickening. No significant calcific coronary arteriosclerosis. Mediastinum/Nodes: No enlarged mediastinal, hilar, or axillary lymph nodes. Thyroid gland, trachea, and esophagus demonstrate no significant findings. Lungs/Pleura: Scattered noncalcified pulmonary nodules are identified bilaterally, measuring up to 4.7 mm. Two of these are seen in the right upper lobe anterior segment (4.6 mm each) series 6/71, one in the right middle lobe (4.7 mm in average), image 68, one in the left upper lobe (4.6 mm) image 72 and a 3 mm nodule in the right lower lobe, image 86. Dependent  bibasilar atelectasis is noted. No pneumothorax or  pulmonary consolidations. Trachea and mainstem bronchi appear patent. Upper Abdomen: The liver is enlarged and in homogeneous in appearance. Subtle underlying hypodense masses are not excluded but suboptimally characterized on this exam. A calcified granuloma is seen in the left hepatic lobe. The included left adrenal gland is unremarkable. The right adrenal gland is excluded. Musculoskeletal: No chest wall mass. No aggressive osseous lesion of the bony thorax. No acute fracture. Review of the MIP images confirms the above findings. IMPRESSION: 1. No acute pulmonary embolus, aortic aneurysm or dissection. 2. Scattered bilateral noncalcified pulmonary nodules measuring up to 4.7 mm. This in conjunction with an enlarged heterogeneous, abnormal appearance of the liver with subtle hypodense masslike abnormalities within raise concern for possible metastatic disease. Dedicated CT of the abdomen and pelvis with IV contrast is recommended for further correlation. Aortic Atherosclerosis (ICD10-I70.0). Electronically Signed   By: Ashley Royalty M.D.   On: 04/22/2018 19:01   Ct Abdomen Pelvis W Contrast  Result Date: 04/22/2018 CLINICAL DATA:  Initial evaluation for acute abdominal distension. EXAM: CT ABDOMEN AND PELVIS WITH CONTRAST TECHNIQUE: Multidetector CT imaging of the abdomen and pelvis was performed using the standard protocol following bolus administration of intravenous contrast. CONTRAST:  121mL OMNIPAQUE IOHEXOL 300 MG/ML  SOLN COMPARISON:  Prior CTA from earlier the same day. FINDINGS: Lower chest: Mild scattered subsegmental atelectatic changes present within the lung bases. 5 mm nodule within the lower right lung, better evaluated on recent chest CT. Small pericardial effusion. Hepatobiliary: Liver is diffusely enlarged with innumerable hypodense lesions scattered throughout both the right and left hepatic lobes, highly concerning for widespread hepatic  metastases. Liver demonstrates a abnormal nodular contour or. For reference purposes, the dominant lesion position within the central aspect of the liver measures approximately 7.3 x 6.2 cm (series 3, image 40). Partially distended gallbladder without acute abnormality. Trace pericholecystic fluid noted, favored to be related intrinsic liver disease. No biliary dilatation. Pancreas: Pancreas within normal limits. Spleen: Spleen within normal limits. Adrenals/Urinary Tract: 15 mm left adrenal nodule, indeterminate. Right adrenal gland grossly unremarkable, although poorly visualized due to mass effect by the adjacent liver. Kidneys fairly equal in size with symmetric enhancement. Subcentimeter hypodensity at the lower pole of the right kidney too small the characterize. No visible nephrolithiasis or hydronephrosis. No focal enhancing renal mass. Contrast material within the renal collecting systems bilaterally. No hydroureter. Partially distended bladder without acute abnormality. Secreted IV contrast material within the bladder lumen. Stomach/Bowel: Stomach displaced towards the left upper quadrant by the enlarged liver, but otherwise unremarkable without acute finding. No evidence for small bowel obstruction. There is an irregular exophytic enhancing mass within the right lower quadrant measuring approximately 4.3 x 3.1 x 4.7 cm (series 3, image 66). Mass is intimately associated with an adjacent loop of ileum as well as the cecum, making it not entirely clear from which the lesion is arising from, although cecal origin is favored. Additionally, the appendix is not definitely visualized, raising the possibility for an appendiceal origin which could be considered as well. Surrounding inflammatory stranding and induration within the adjacent mesenteric fat. No other acute inflammatory changes about the bowels. Few scattered colonic diverticula noted. Vascular/Lymphatic: Normal intravascular enhancement seen  throughout the intra-abdominal aorta. Mild to moderate aorto bi-iliac atherosclerotic disease. No aneurysm. Mesenteric vessels patent proximally. Enlarged 13 mm mesenteric lymph node within the right lower quadrant, concerning for nodal metastasis (series 3, image 66). No other pathologically enlarged lymph nodes identified within the abdomen and pelvis. Reproductive: Prostate normal. Other:  Small volume mildly complex free fluid within the pelvis. No free intraperitoneal air. Musculoskeletal: Mild diffuse anasarca within the external soft tissues, likely related to intrinsic liver disease. No acute osseous abnormality. No discrete lytic or blastic osseous lesions. IMPRESSION: 1. 4.3 x 3.1 x 4.7 cm exophytic enhancing mass within the right lower quadrant, intimately associated with an adjacent loop of ileum as well as the cecum, making it not entirely clear from which the lesion is arising from, although cecal origin is perhaps favored. Additionally, the appendix is not definitely visualized, raising the possibility for an appendiceal origin which could be considered as well. 2. Diffusely enlarged liver with innumerable hypodense lesions, concerning for widespread hepatic metastases. 3. Enlarged 13 mm mesenteric lymph node within the right lower quadrant, concerning for nodal metastasis. 4. 15 mm left adrenal nodule, indeterminate. 5. 5 mm right lower lobe pulmonary nodule, better evaluated on recent chest CT, but concerning for metastatic disease. 6. Small volume free fluid within the pelvis with mild diffuse anasarca. Electronically Signed   By: Jeannine Boga M.D.   On: 04/22/2018 20:47   Vas [place] Lower Extremity Venous (dvt) (only [facility])  Result Date: 04/22/2018  Lower Venous Study Indications: Pitting edema.  Performing Technologist: June Leap RDMS, RVT  Examination Guidelines: A complete evaluation includes B-mode imaging, spectral Doppler, color Doppler, and power Doppler as needed of all  accessible portions of each vessel. Bilateral testing is considered an integral part of a complete examination. Limited examinations for reoccurring indications may be performed as noted.  Right Venous Findings: +---------+---------------+---------+-----------+----------+-------+          CompressibilityPhasicitySpontaneityPropertiesSummary +---------+---------------+---------+-----------+----------+-------+ CFV      Full           Yes      Yes                          +---------+---------------+---------+-----------+----------+-------+ SFJ      Full                                                 +---------+---------------+---------+-----------+----------+-------+ FV Prox  Full                                                 +---------+---------------+---------+-----------+----------+-------+ FV Mid   Full                                                 +---------+---------------+---------+-----------+----------+-------+ FV DistalFull                                                 +---------+---------------+---------+-----------+----------+-------+ PFV      Full                                                 +---------+---------------+---------+-----------+----------+-------+  POP      Full           Yes      Yes                          +---------+---------------+---------+-----------+----------+-------+ PTV      Full                                                 +---------+---------------+---------+-----------+----------+-------+ PERO     Full                                                 +---------+---------------+---------+-----------+----------+-------+  Left Venous Findings: +---------+---------------+---------+-----------+----------+-------+          CompressibilityPhasicitySpontaneityPropertiesSummary +---------+---------------+---------+-----------+----------+-------+ CFV      Full           Yes      Yes                           +---------+---------------+---------+-----------+----------+-------+ SFJ      Full                                                 +---------+---------------+---------+-----------+----------+-------+ FV Prox  Full                                                 +---------+---------------+---------+-----------+----------+-------+ FV Mid   Full                                                 +---------+---------------+---------+-----------+----------+-------+ FV DistalFull                                                 +---------+---------------+---------+-----------+----------+-------+ PFV      Full                                                 +---------+---------------+---------+-----------+----------+-------+ POP      Full           Yes      Yes                          +---------+---------------+---------+-----------+----------+-------+ PTV      Full                                                 +---------+---------------+---------+-----------+----------+-------+  PERO     Full                                                 +---------+---------------+---------+-----------+----------+-------+    Summary: Right: There is no evidence of deep vein thrombosis in the lower extremity. No cystic structure found in the popliteal fossa. Left: There is no evidence of deep vein thrombosis in the lower extremity. No cystic structure found in the popliteal fossa.  *See table(s) above for measurements and observations.    Preliminary      Tye Savoy, NP-C @  04/23/2018, 12:45 PM

## 2018-04-23 NOTE — Consult Note (Addendum)
New Hematology/Oncology Consult   Requesting DP:OEUMPNTI [name]      Reason for Consult: Metastatic colon cancer  HPI: [name] presented to his primary physician [id number] with bilateral lower extremity swelling, exertional dyspnea, and back pain.  An EKG revealed a sinus tachycardia with a short PR interval and incomplete right bundle branch block.  He was referred to the emergency room. A CT of the chest, abdomen, and pelvis on 04/22/2018 revealed no pulmonary embolism.  Multiple noncalcified pulmonary nodules concerning for metastatic disease.  A mass is noted in the right lower quadrant consistent with a cecal mass, the liver is diffusely enlarged with innumerable hypodense lesions enlarged right lower quadrant mesenteric lymph node. He was admitted for further evaluation    Past Medical History:  Diagnosis Date  . GERD (gastroesophageal reflux disease)   : .  Chronic back pain secondary to degenerative arthritis-status post epidural steroid injections 5 years ago   .  Macular degeneration  History reviewed. No pertinent surgical history.:   Current Facility-Administered Medications:  .  0.9 %  sodium chloride infusion, 250 mL, Intravenous, PRN, Opyd, Timothy S, MD .  enoxaparin (LOVENOX) injection 40 mg, 40 mg, Subcutaneous, Q24H, Opyd, Timothy S, MD .  fentaNYL (SUBLIMAZE) injection 25-50 mcg, 25-50 mcg, Intravenous, Q2H PRN, Opyd, Ilene Qua, MD, 50 mcg at 04/23/18 0453 .  ondansetron (ZOFRAN) tablet 4 mg, 4 mg, Oral, Q6H PRN **OR** ondansetron (ZOFRAN) injection 4 mg, 4 mg, Intravenous, Q6H PRN, Opyd, Timothy S, MD .  polyethylene glycol (MIRALAX / GLYCOLAX) packet 17 g, 17 g, Oral, Daily PRN, Opyd, Timothy S, MD .  sodium chloride flush (NS) 0.9 % injection 3 mL, 3 mL, Intravenous, Q12H, Opyd, Timothy S, MD .  sodium chloride flush (NS) 0.9 % injection 3 mL, 3 mL, Intravenous, Q12H, Opyd, Timothy S, MD .  sodium chloride flush (NS) 0.9 % injection 3 mL, 3 mL, Intravenous,  PRN, Opyd, Ilene Qua, MD .  zolpidem (AMBIEN) tablet 5-10 mg, 5-10 mg, Oral, QHS PRN, Opyd, Ilene Qua, MD, 5 mg at 04/23/18 0007:  . enoxaparin (LOVENOX) injection  40 mg Subcutaneous Q24H  . sodium chloride flush  3 mL Intravenous Q12H  . sodium chloride flush  3 mL Intravenous Q12H  :  No Known Allergies:  FH: No family history of cancer.  He has 1 son and 1 daughter.  SOCIAL HISTORY: He lives with his wife in [place].  He works as a Librarian, academic for [employer].  He smokes 1 pack of cigarettes every 3 days.  He drinks several beers each night.  No transfusion history.  No risk factor for HIV or hepatitis.  Review of Systems:  Positives include: Reflux symptoms beginning approximately 2 months ago, chronic low back pain, leg "weakness ", tingling from the thighs to the feet at night for several months, exertional dyspnea, intermittent diarrhea for 2 months  A complete ROS was otherwise negative.   Physical Exam:  Blood pressure 128/70, pulse 93, temperature 98.1 F (36.7 C), temperature source Oral, resp. rate 16, height 5\' 11"  (1.803 m), weight 184 lb 15.5 oz (83.9 kg), SpO2 100 %.  HEENT: Oral cavity without visible mass, neck without mass Lungs: Clear bilaterally, no respiratory distress Cardiac: Regular rate and rhythm Abdomen: The liver is markedly enlarged and extends across the midline, no splenomegaly GU: Testes without mass Vascular: 1+ pitting edema at the left greater than right lower leg Lymph nodes: No cervical, supraclavicular, axillary, or inguinal nodes Neurologic: Alert and  oriented, the motor exam is intact in the upper extremities bilaterally, 4/5 strength with flexion at the left hip Skin: No rash Musculoskeletal: No spine tenderness  LABS:  Recent Labs    04/22/18 1556 04/23/18 0309  WBC 15.2* 10.8*  HGB 12.9* 11.4*  HCT 39.7 35.4*  PLT 319 204    Recent Labs    04/22/18 1556 04/23/18 0309  NA 138 140  K 4.1 3.7  CL 102 104  CO2 21* 24   GLUCOSE 122* 112*  BUN 9 8  CREATININE 0.90 0.88  CALCIUM 8.1* 7.7*      RADIOLOGY:  Dg Chest 2 View  Result Date: 04/22/2018 CLINICAL DATA:  Feet and ankle swelling. EXAM: CHEST - 2 VIEW COMPARISON:  No prior. FINDINGS: Mediastinum hilar structures normal. Heart size normal. Mild right upper lobe infiltrate. Mild bibasilar atelectasis. Follow-up chest x-ray to demonstrate clearing suggested. No pleural effusion or pneumothorax. IMPRESSION: Mild right upper lobe infiltrate. Mild bibasilar atelectasis. Follow-up chest x-rays to demonstrate clearing suggested. Followup PA and lateral chest X-ray is recommended in 3-4 weeks following trial of antibiotic therapy to ensure resolution and exclude underlying malignancy. Electronically Signed   By: Marcello Moores  Register   On: 04/22/2018 16:30   Ct Angio Chest Pe W And/or Wo Contrast  Result Date: 04/22/2018 CLINICAL DATA:  Dyspnea ongoing for 2-3 weeks. Bilateral lower extremity swelling onset 5 days ago. EXAM: CT ANGIOGRAPHY CHEST WITH CONTRAST TECHNIQUE: Multidetector CT imaging of the chest was performed using the standard protocol during bolus administration of intravenous contrast. Multiplanar CT image reconstructions and MIPs were obtained to evaluate the vascular anatomy. CONTRAST:  55 cc ISOVUE-370 IOPAMIDOL (ISOVUE-370) INJECTION 76% COMPARISON:  CXR 04/22/2018 FINDINGS: Cardiovascular: Conventional branch pattern of the great vessels with atherosclerosis of the left subclavian artery. Nonaneurysmal minimally atherosclerotic thoracic aorta. No dissection. Satisfactory opacification of the pulmonary arteries to the segmental level without acute pulmonary embolus. Heart size is top normal. No pericardial effusion or thickening. No significant calcific coronary arteriosclerosis. Mediastinum/Nodes: No enlarged mediastinal, hilar, or axillary lymph nodes. Thyroid gland, trachea, and esophagus demonstrate no significant findings. Lungs/Pleura: Scattered  noncalcified pulmonary nodules are identified bilaterally, measuring up to 4.7 mm. Two of these are seen in the right upper lobe anterior segment (4.6 mm each) series 6/71, one in the right middle lobe (4.7 mm in average), image 68, one in the left upper lobe (4.6 mm) image 72 and a 3 mm nodule in the right lower lobe, image 86. Dependent bibasilar atelectasis is noted. No pneumothorax or pulmonary consolidations. Trachea and mainstem bronchi appear patent. Upper Abdomen: The liver is enlarged and in homogeneous in appearance. Subtle underlying hypodense masses are not excluded but suboptimally characterized on this exam. A calcified granuloma is seen in the left hepatic lobe. The included left adrenal gland is unremarkable. The right adrenal gland is excluded. Musculoskeletal: No chest wall mass. No aggressive osseous lesion of the bony thorax. No acute fracture. Review of the MIP images confirms the above findings. IMPRESSION: 1. No acute pulmonary embolus, aortic aneurysm or dissection. 2. Scattered bilateral noncalcified pulmonary nodules measuring up to 4.7 mm. This in conjunction with an enlarged heterogeneous, abnormal appearance of the liver with subtle hypodense masslike abnormalities within raise concern for possible metastatic disease. Dedicated CT of the abdomen and pelvis with IV contrast is recommended for further correlation. Aortic Atherosclerosis (ICD10-I70.0). Electronically Signed   By: Ashley Royalty M.D.   On: 04/22/2018 19:01   Ct Abdomen Pelvis W Contrast  Result  Date: 04/22/2018 CLINICAL DATA:  Initial evaluation for acute abdominal distension. EXAM: CT ABDOMEN AND PELVIS WITH CONTRAST TECHNIQUE: Multidetector CT imaging of the abdomen and pelvis was performed using the standard protocol following bolus administration of intravenous contrast. CONTRAST:  192mL OMNIPAQUE IOHEXOL 300 MG/ML  SOLN COMPARISON:  Prior CTA from earlier the same day. FINDINGS: Lower chest: Mild scattered subsegmental  atelectatic changes present within the lung bases. 5 mm nodule within the lower right lung, better evaluated on recent chest CT. Small pericardial effusion. Hepatobiliary: Liver is diffusely enlarged with innumerable hypodense lesions scattered throughout both the right and left hepatic lobes, highly concerning for widespread hepatic metastases. Liver demonstrates a abnormal nodular contour or. For reference purposes, the dominant lesion position within the central aspect of the liver measures approximately 7.3 x 6.2 cm (series 3, image 40). Partially distended gallbladder without acute abnormality. Trace pericholecystic fluid noted, favored to be related intrinsic liver disease. No biliary dilatation. Pancreas: Pancreas within normal limits. Spleen: Spleen within normal limits. Adrenals/Urinary Tract: 15 mm left adrenal nodule, indeterminate. Right adrenal gland grossly unremarkable, although poorly visualized due to mass effect by the adjacent liver. Kidneys fairly equal in size with symmetric enhancement. Subcentimeter hypodensity at the lower pole of the right kidney too small the characterize. No visible nephrolithiasis or hydronephrosis. No focal enhancing renal mass. Contrast material within the renal collecting systems bilaterally. No hydroureter. Partially distended bladder without acute abnormality. Secreted IV contrast material within the bladder lumen. Stomach/Bowel: Stomach displaced towards the left upper quadrant by the enlarged liver, but otherwise unremarkable without acute finding. No evidence for small bowel obstruction. There is an irregular exophytic enhancing mass within the right lower quadrant measuring approximately 4.3 x 3.1 x 4.7 cm (series 3, image 66). Mass is intimately associated with an adjacent loop of ileum as well as the cecum, making it not entirely clear from which the lesion is arising from, although cecal origin is favored. Additionally, the appendix is not definitely  visualized, raising the possibility for an appendiceal origin which could be considered as well. Surrounding inflammatory stranding and induration within the adjacent mesenteric fat. No other acute inflammatory changes about the bowels. Few scattered colonic diverticula noted. Vascular/Lymphatic: Normal intravascular enhancement seen throughout the intra-abdominal aorta. Mild to moderate aorto bi-iliac atherosclerotic disease. No aneurysm. Mesenteric vessels patent proximally. Enlarged 13 mm mesenteric lymph node within the right lower quadrant, concerning for nodal metastasis (series 3, image 66). No other pathologically enlarged lymph nodes identified within the abdomen and pelvis. Reproductive: Prostate normal. Other: Small volume mildly complex free fluid within the pelvis. No free intraperitoneal air. Musculoskeletal: Mild diffuse anasarca within the external soft tissues, likely related to intrinsic liver disease. No acute osseous abnormality. No discrete lytic or blastic osseous lesions. IMPRESSION: 1. 4.3 x 3.1 x 4.7 cm exophytic enhancing mass within the right lower quadrant, intimately associated with an adjacent loop of ileum as well as the cecum, making it not entirely clear from which the lesion is arising from, although cecal origin is perhaps favored. Additionally, the appendix is not definitely visualized, raising the possibility for an appendiceal origin which could be considered as well. 2. Diffusely enlarged liver with innumerable hypodense lesions, concerning for widespread hepatic metastases. 3. Enlarged 13 mm mesenteric lymph node within the right lower quadrant, concerning for nodal metastasis. 4. 15 mm left adrenal nodule, indeterminate. 5. 5 mm right lower lobe pulmonary nodule, better evaluated on recent chest CT, but concerning for metastatic disease. 6. Small volume free fluid  within the pelvis with mild diffuse anasarca. Electronically Signed   By: Jeannine Boga M.D.   On:  04/22/2018 20:47   Vas [place] Lower Extremity Venous (dvt) (only [facility])  Result Date: 04/22/2018  Lower Venous Study Indications: Pitting edema.  Performing Technologist: June Leap RDMS, RVT  Examination Guidelines: A complete evaluation includes B-mode imaging, spectral Doppler, color Doppler, and power Doppler as needed of all accessible portions of each vessel. Bilateral testing is considered an integral part of a complete examination. Limited examinations for reoccurring indications may be performed as noted.  Right Venous Findings: +---------+---------------+---------+-----------+----------+-------+          CompressibilityPhasicitySpontaneityPropertiesSummary +---------+---------------+---------+-----------+----------+-------+ CFV      Full           Yes      Yes                          +---------+---------------+---------+-----------+----------+-------+ SFJ      Full                                                 +---------+---------------+---------+-----------+----------+-------+ FV Prox  Full                                                 +---------+---------------+---------+-----------+----------+-------+ FV Mid   Full                                                 +---------+---------------+---------+-----------+----------+-------+ FV DistalFull                                                 +---------+---------------+---------+-----------+----------+-------+ PFV      Full                                                 +---------+---------------+---------+-----------+----------+-------+ POP      Full           Yes      Yes                          +---------+---------------+---------+-----------+----------+-------+ PTV      Full                                                 +---------+---------------+---------+-----------+----------+-------+ PERO     Full                                                  +---------+---------------+---------+-----------+----------+-------+  Left Venous Findings: +---------+---------------+---------+-----------+----------+-------+  CompressibilityPhasicitySpontaneityPropertiesSummary +---------+---------------+---------+-----------+----------+-------+ CFV      Full           Yes      Yes                          +---------+---------------+---------+-----------+----------+-------+ SFJ      Full                                                 +---------+---------------+---------+-----------+----------+-------+ FV Prox  Full                                                 +---------+---------------+---------+-----------+----------+-------+ FV Mid   Full                                                 +---------+---------------+---------+-----------+----------+-------+ FV DistalFull                                                 +---------+---------------+---------+-----------+----------+-------+ PFV      Full                                                 +---------+---------------+---------+-----------+----------+-------+ POP      Full           Yes      Yes                          +---------+---------------+---------+-----------+----------+-------+ PTV      Full                                                 +---------+---------------+---------+-----------+----------+-------+ PERO     Full                                                 +---------+---------------+---------+-----------+----------+-------+    Summary: Right: There is no evidence of deep vein thrombosis in the lower extremity. No cystic structure found in the popliteal fossa. Left: There is no evidence of deep vein thrombosis in the lower extremity. No cystic structure found in the popliteal fossa.  *See table(s) above for measurements and observations.    Preliminary     Assessment and Plan:   1.  Cecal mass, extensive liver  metastases-consistent with metastatic colon cancer  CTs 04/22/2018- right lower quadrant mass associated with the ileum and cecum, hepatomegaly with extensive liver metastases, multiple lung nodules consistent with metastases  2.  Lower extremity edema secondary to massive hepatomegaly and hypoalbuminemia  3.  Chronic back pain   Mr. Embleton presents with massive hepatomegaly and lower extremity edema.  He appears to have metastatic colon cancer extensively involving the liver.  I discussed the probable diagnosis with Mr. Veva Holes and his wife.  We reviewed the CT images.  He understands no therapy will be curative if he is confirmed to have metastatic colon cancer.  Treatment will consist of systemic therapy.  I recommend beginning treatment within the next 1-2 weeks.  Recommendations: 1.  Ultrasound-guided biopsy of a liver lesion to obtain tissue for diagnosis and molecular testing 2.  GI consult for colonoscopy to confirm a colon primary 3.  Check CEA 4.  Port-A-Cath 5.  Outpatient follow-up will be scheduled at the Cancer center  Betsy Coder, MD 04/23/2018, 10:03 AM

## 2018-04-24 ENCOUNTER — Other Ambulatory Visit: Payer: Self-pay | Admitting: Oncology

## 2018-04-24 ENCOUNTER — Encounter (HOSPITAL_COMMUNITY)
Admission: EM | Disposition: A | Discharge status: Home / Self Care | Payer: Self-pay | Source: Ambulatory Visit | Attending: Internal Medicine

## 2018-04-24 ENCOUNTER — Inpatient Hospital Stay (HOSPITAL_COMMUNITY): Payer: 59 | Admitting: Certified Registered Nurse Anesthetist

## 2018-04-24 ENCOUNTER — Encounter (HOSPITAL_COMMUNITY): Payer: Self-pay

## 2018-04-24 DIAGNOSIS — D122 Benign neoplasm of ascending colon: Secondary | ICD-10-CM

## 2018-04-24 DIAGNOSIS — D123 Benign neoplasm of transverse colon: Secondary | ICD-10-CM

## 2018-04-24 DIAGNOSIS — C787 Secondary malignant neoplasm of liver and intrahepatic bile duct: Principal | ICD-10-CM

## 2018-04-24 DIAGNOSIS — D124 Benign neoplasm of descending colon: Secondary | ICD-10-CM

## 2018-04-24 DIAGNOSIS — D125 Benign neoplasm of sigmoid colon: Secondary | ICD-10-CM

## 2018-04-24 DIAGNOSIS — C180 Malignant neoplasm of cecum: Secondary | ICD-10-CM

## 2018-04-24 DIAGNOSIS — Z7189 Other specified counseling: Secondary | ICD-10-CM | POA: Insufficient documentation

## 2018-04-24 DIAGNOSIS — C189 Malignant neoplasm of colon, unspecified: Secondary | ICD-10-CM

## 2018-04-24 HISTORY — PX: POLYPECTOMY: SHX5525

## 2018-04-24 HISTORY — PX: COLONOSCOPY WITH PROPOFOL: SHX5780

## 2018-04-24 LAB — GLUCOSE, CAPILLARY: Glucose-Capillary: 96 mg/dL (ref 70–99)

## 2018-04-24 SURGERY — COLONOSCOPY WITH PROPOFOL
Anesthesia: Monitor Anesthesia Care

## 2018-04-24 MED ORDER — ONDANSETRON HCL 4 MG/2ML IJ SOLN
INTRAMUSCULAR | Status: DC | PRN
  Administered 2018-04-24: 4 mg via INTRAVENOUS

## 2018-04-24 MED ORDER — PROPOFOL 500 MG/50ML IV EMUL
INTRAVENOUS | Status: DC | PRN
  Administered 2018-04-24: 100 ug/kg/min via INTRAVENOUS

## 2018-04-24 MED ORDER — PROPOFOL 10 MG/ML IV BOLUS
INTRAVENOUS | Status: DC | PRN
  Administered 2018-04-24 (×2): 20 mg via INTRAVENOUS

## 2018-04-24 MED ORDER — LACTATED RINGERS IV SOLN
INTRAVENOUS | Status: DC | PRN
  Administered 2018-04-24: 11:00:00 via INTRAVENOUS

## 2018-04-24 MED ORDER — PHENYLEPHRINE 40 MCG/ML (10ML) SYRINGE FOR IV PUSH (FOR BLOOD PRESSURE SUPPORT)
PREFILLED_SYRINGE | INTRAVENOUS | Status: DC | PRN
  Administered 2018-04-24 (×3): 80 ug via INTRAVENOUS

## 2018-04-24 SURGICAL SUPPLY — 22 items

## 2018-04-24 NOTE — Transfer of Care (Signed)
Immediate Anesthesia Transfer of Care Note  Patient: Thomas Riley  Procedure(s) Performed: COLONOSCOPY WITH PROPOFOL (N/A )  Patient Location: Endoscopy Unit  Anesthesia Type:MAC  Level of Consciousness: awake, alert  and oriented  Airway & Oxygen Therapy: Patient Spontanous Breathing and Patient connected to nasal cannula oxygen  Post-op Assessment: Report given to RN and Post -op Vital signs reviewed and stable  Post vital signs: Reviewed and stable  Last Vitals:  Vitals Value Taken Time  BP 105/46 04/24/2018 12:02 PM  Temp 36.8 C 04/24/2018 12:00 PM  Pulse 86 04/24/2018 12:03 PM  Resp 15 04/24/2018 12:03 PM  SpO2 96 % 04/24/2018 12:03 PM  Vitals shown include unvalidated device data.  Last Pain:  Vitals:   04/24/18 1200  TempSrc: Oral  PainSc:       Patients Stated Pain Goal: 0 (29/24/46 2863)  Complications: No apparent anesthesia complications

## 2018-04-24 NOTE — Progress Notes (Signed)
PROGRESS NOTE    Thomas Riley  ZOX:[id number] DOB: [birth year] DOA: 04/22/2018 PCP: Vivi Barrack, MD    Brief Narrative:  64 year old male who presented with leg edema.  He does have significant past medical history for tobacco abuse, and chronic lower back pain. Reported iInsidious worsening of his lower back pain over the last 2 months, with radiation to bilateral lower extremities.  Recently he had noticed bilateral lower extremity edema that has been associated with exertional dyspnea, and night sweats.  No chest pain or angina.  His symptoms were refractive to steroids and furosemide.  His initial physical examination blood pressure 131/70, respiratory rate 13, moist mucous membranes, lungs clear to auscultation bilaterally, heart S1-S2 present and rhythmic, abdomen mildly distended, no guarding, no tenderness, positive lower extremity edema bilaterally.  Sodium was 138, potassium 4.1, chloride 102, bicarb 21, glucose 122, BUN 9, creatinine 0.90, white count 15.2, hemoglobin 12.9, hematocrit 39.7, platelets 319.  Chest CT was negative for pulmonary embolism.  He had scattered bilateral noncalcified pulmonary nodules measuring up to 4.7 mm.  CT of the abdomen and pelvis showed a 4.3 x 3.1 x 4.7 cm exophytic enhancing mass within the right lower quadrant, intimately associated with an adjacent loop of ileum as well as cecum.  Diffusely enlarged liver with innumerable hypodense lesions.  13 mm mesenteric lymph node within the right lower quadrant concerning for nodal metastasis.  15 mm left adrenal nodule.  EKG had sinus rhythm, left axis deviation, incomplete right bundle branch block, septal T wave inversions.  The patient was admitted to the hospital with a working diagnosis of newly diagnosed colonic and liver masses.    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 Colonic mass with liver lesions and mesenteric lymphadenopathy. Very high pretest probability for colon cancer. Patient underwent colonoscopy today, multiple polyps resected, will continue work up with liver biopsy per IR. Patient also scheduled for a port and outpatient follow up with oncology for possible chemotherapy.   2. Lower extremity edema/ with anasarca. Will need optimal nutrition, will continue to hold on diuretics for now.   3. Reactive leukocytosis. Improved down to 10,8 from 15.2. No indication for antibiotics therapy.   4. GERD. On pantoprazole.    DVT prophylaxis: scd   Code Status:  full Family Communication:  Disposition Plan/ discharge barriers: pending GI workup.    Body mass index is 25.8 kg/m. Malnutrition Type:      Malnutrition Characteristics:      Nutrition Interventions:     RN Pressure Injury Documentation:     Consultants:   GI   Oncology   IR  Procedures:   Colonoscopy   Antimicrobials:       Subjective: Patient sp colonoscopy with no nausea or vomiting, no chest pain or dyspnea.   Objective: Vitals:   04/23/18 2222 04/24/18 0538 04/24/18 0925 04/24/18 0953  BP: (!) 125/50 127/69 122/72 (!) 127/51  Pulse: 98 74 87 80  Resp:   18 15  Temp: 98.4 F (36.9 C) 98.1 F (36.7 C) 98.4 F (36.9 C) 98 F (36.7 C)  TempSrc: Oral Oral Oral Oral  SpO2: 100% 97% 99%   Weight:      Height:        Intake/Output Summary (  Last 24 hours) at 04/24/2018 1044 Last data filed at 04/24/2018 0836 Gross per 24 hour  Intake 3 ml  Output -  Net 3 ml   Filed Weights   04/22/18 1547 04/23/18 0132 04/23/18 0451  Weight: 86.2 kg 84.2 kg 83.9 kg    Examination:   General: Not in pain.  Neurology: Awake and alert, non focal  E ENT: no pallor, no icterus, oral mucosa moist Cardiovascular: No JVD. S1-S2 present, rhythmic, no gallops, rubs, or  murmurs. No lower extremity edema. Pulmonary: positive breath sounds bilaterally, adequate air movement, no wheezing, rhonchi or rales. Gastrointestinal. Abdomen with, no organomegaly, non tender, no rebound or guarding Skin. No rashes Musculoskeletal: no joint deformities     Data Reviewed: I have personally reviewed following labs and imaging studies  CBC: Recent Labs  Lab 04/22/18 1556 04/23/18 0309  WBC 15.2* 10.8*  NEUTROABS  --  8.6*  HGB 12.9* 11.4*  HCT 39.7 35.4*  MCV 95.2 93.7  PLT 319 948   Basic Metabolic Panel: Recent Labs  Lab 04/22/18 1556 04/23/18 0309  NA 138 140  K 4.1 3.7  CL 102 104  CO2 21* 24  GLUCOSE 122* 112*  BUN 9 8  CREATININE 0.90 0.88  CALCIUM 8.1* 7.7*   GFR: Estimated Creatinine Clearance: 91.5 mL/min (by C-G formula based on SCr of 0.88 mg/dL). Liver Function Tests: Recent Labs  Lab 04/22/18 1905 04/23/18 0309  AST 93* 86*  ALT 53* 42  ALKPHOS 343* 298*  BILITOT 1.3* 1.3*  PROT 7.2 5.7*  ALBUMIN 2.9* 2.4*   No results for input(s): LIPASE, AMYLASE in the last 168 hours. No results for input(s): AMMONIA in the last 168 hours. Coagulation Profile: No results for input(s): INR, PROTIME in the last 168 hours. Cardiac Enzymes: No results for input(s): CKTOTAL, CKMB, CKMBINDEX, TROPONINI in the last 168 hours. BNP (last 3 results) No results for input(s): PROBNP in the last 8760 hours. HbA1C: No results for input(s): HGBA1C in the last 72 hours. CBG: Recent Labs  Lab 04/23/18 0817 04/24/18 0738  GLUCAP 102* 96   Lipid Profile: No results for input(s): CHOL, HDL, LDLCALC, TRIG, CHOLHDL, LDLDIRECT in the last 72 hours. Thyroid Function Tests: No results for input(s): TSH, T4TOTAL, FREET4, T3FREE, THYROIDAB in the last 72 hours. Anemia Panel: No results for input(s): VITAMINB12, FOLATE, FERRITIN, TIBC, IRON, RETICCTPCT in the last 72 hours.    Radiology Studies: I have reviewed all of the imaging during this hospital  visit personally     Scheduled Meds: . [MAR Hold] enoxaparin (LOVENOX) injection  40 mg Subcutaneous Q24H  . [MAR Hold] sodium chloride flush  3 mL Intravenous Q12H  . [MAR Hold] sodium chloride flush  3 mL Intravenous Q12H   Continuous Infusions: . [MAR Hold] sodium chloride       LOS: 2 days        Levonne Carreras Gerome Apley, MD

## 2018-04-24 NOTE — Progress Notes (Signed)
START ON PATHWAY REGIMEN - Colorectal     A cycle is every 14 days:     Oxaliplatin      Leucovorin      5-Fluorouracil      5-Fluorouracil      Bevacizumab-xxxx   **Always confirm dose/schedule in your pharmacy ordering system**  Patient Characteristics: Distant Metastases, First Line, Nonsurgical Candidate, KRAS Mutation Positive/Unknown, BRAF Wild-Type/Unknown, PS = 0,1; Bevacizumab Eligible Therapeutic Status: Distant Metastases BRAF Mutation Status: Awaiting Test Results KRAS/NRAS Mutation Status: Awaiting Test Results Line of Therapy: First Line Performance Status: PS = 0, 1 Bevacizumab Eligibility: Eligible Intent of Therapy: Non-Curative / Palliative Intent, Discussed with Patient

## 2018-04-24 NOTE — Op Note (Addendum)
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 Hilarie Fredrickson, MD, Burtis Junes, RN, Cherylynn Ridges,                            Technician, Clearnce Sorrel, CRNA Referring MD:             [facility] Medicines:                Monitored Anesthesia Care Complications:            No immediate complications. Estimated Blood Loss:     Estimated blood loss was minimal. Procedure:                Pre-Anesthesia Assessment:                           - Prior to the procedure, a History and Physical                            was performed, and patient medications and                            allergies were reviewed. The patient's tolerance of                            previous anesthesia was also reviewed. The risks                            and benefits of the procedure and the sedation                            options and risks were discussed with the patient.                            All questions were answered, and informed consent                            was obtained. Prior Anticoagulants: The patient has                            taken no previous anticoagulant or antiplatelet                            agents. ASA Grade Assessment: II - A patient with  mild systemic disease. After reviewing the risks                            and benefits, the patient was deemed in                            satisfactory condition to undergo the procedure.                           After obtaining informed consent, the colonoscope                            was passed under direct vision. Throughout the                             procedure, the patient's blood pressure, pulse, and                            oxygen saturations were monitored continuously. The                            CF-HQ190L ([id number]) Olympus colonoscope was                            introduced through the anus and advanced to the                            terminal ileum. The colonoscopy was performed                            without difficulty. The patient tolerated the                            procedure well. The quality of the bowel                            preparation was good. The terminal ileum, ileocecal                            valve, appendiceal orifice, and rectum were                            photographed. Scope In: 99:83:38 AM Scope Out: 11:56:26 AM Scope Withdrawal Time: 0 hours 35 minutes 12 seconds  Total Procedure Duration: 0 hours 40 minutes 17 seconds  Findings:      The digital rectal exam was normal.      The cecum appeared normal. The cecum is hard to distend, but after       careful inspection I do not seen a mucosal lesion in the cecum. There       appears to be traction at the appendiceal orifice, but no lesion seen       protruding from this location. As below 2 cecal diverticuli are seen.      Three sessile polyps were found in the ascending colon. The polyps were  3 to 6 mm in size. These polyps were removed with a cold snare.       Resection and retrieval were complete.      A 10 mm polyp was found in the ascending colon. The polyp was sessile.       The polyp was removed with a hot snare. Resection and retrieval were       complete.      A 6 mm polyp was found in the transverse colon. The polyp was sessile.       The polyp was removed with a cold snare. Resection and retrieval were       complete.      A 5 mm polyp was found in the descending colon. The polyp was sessile.       The polyp was removed with a cold snare. Resection and retrieval were       complete.       A 8 mm polyp was found in the descending colon. The polyp was sessile.       The polyp was removed with a hot snare. Resection and retrieval were       complete.      A 14 mm polyp was found in the sigmoid colon. The polyp was       semi-pedunculated. The polyp was removed with a hot snare. Resection and       retrieval were complete.      Two sessile polyps were found in the sigmoid colon. The polyps were 4 to       6 mm in size. These polyps were removed with a cold snare. Resection and       retrieval were complete.      Multiple small and large-mouthed diverticula were found in the sigmoid       colon, descending colon, ascending colon and cecum.      Internal hemorrhoids were found during retroflexion. The hemorrhoids       were small. Impression:               - The cecum as described above, no tumor seen.                           - Three 3 to 6 mm polyps in the ascending colon,                            removed with a cold snare. Resected and retrieved.                           - One 10 mm polyp in the ascending colon, removed                            with a hot snare. Resected and retrieved.                           - One 6 mm polyp in the transverse colon, removed                            with a cold snare. Resected and retrieved.                           -  One 5 mm polyp in the descending colon, removed                            with a cold snare. Resected and retrieved.                           - One 8 mm polyp in the descending colon, removed                            with a hot snare. Resected and retrieved.                           - One 14 mm polyp in the sigmoid colon, removed                            with a hot snare. Resected and retrieved.                           - Two 4 to 6 mm polyps in the sigmoid colon,                            removed with a cold snare. Resected and retrieved.                           - Moderate diverticulosis in the sigmoid  colon, in                            the descending colon, in the ascending colon and in                            the cecum.                           - Internal hemorrhoids. Moderate Sedation:      N/A Recommendation:           - Return patient to hospital ward for ongoing care.                           - Resume previous diet. NPO after MN.                           - Continue present medications.                           - Await pathology results.                           - Add chromogranin A. CEA pending. Discussed with                            Dr. Benay Spice. Proceed with liver biopsy.                           - Will  need repeat colonoscopy in 1 year, depending                            on clinical course. Procedure Code(s):        --- Professional ---                           [phone], Colonoscopy, flexible; with removal of                            tumor(s), polyp(s), or other lesion(s) by snare                            technique Diagnosis Code(s):        --- Professional ---                           K64.8, Other hemorrhoids                           D12.2, Benign neoplasm of ascending colon                           D12.5, Benign neoplasm of sigmoid colon                           D12.3, Benign neoplasm of transverse colon (hepatic                            flexure or splenic flexure)                           D12.4, Benign neoplasm of descending colon                           C78.7, Secondary malignant neoplasm of liver and                            intrahepatic bile duct                           K57.30, Diverticulosis of large intestine without                            perforation or abscess without bleeding CPT copyright 2018 [employer]. All rights reserved. The codes documented in this report are preliminary and upon coder review may  be revised to meet current compliance requirements. Jerene Bears, MD 04/24/2018 12:10:46 PM This report has  been signed electronically. Number of Addenda: 0

## 2018-04-24 NOTE — Anesthesia Postprocedure Evaluation (Signed)
Anesthesia Post Note  Patient: Thomas Riley  Procedure(s) Performed: COLONOSCOPY WITH PROPOFOL (N/A ) POLYPECTOMY     Patient location during evaluation: PACU Anesthesia Type: MAC Level of consciousness: awake and alert Pain management: pain level controlled Vital Signs Assessment: post-procedure vital signs reviewed and stable Respiratory status: spontaneous breathing, nonlabored ventilation, respiratory function stable and patient connected to nasal cannula oxygen Cardiovascular status: stable and blood pressure returned to baseline Postop Assessment: no apparent nausea or vomiting Anesthetic complications: no    Last Vitals:  Vitals:   04/24/18 1210 04/24/18 1248  BP: (!) 107/55 124/71  Pulse: 80 84  Resp: 19 17  Temp:  36.4 C  SpO2: 97% 99%    Last Pain:  Vitals:   04/24/18 1248  TempSrc: Oral  PainSc:                  [place] S

## 2018-04-24 NOTE — Progress Notes (Signed)
[name] appears unchanged.  He is scheduled for colonoscopy today and liver biopsy/Port-A-Cath placement tomorrow.  Outpatient follow-up will be scheduled at the Cancer center for an office visit and to begin chemotherapy during the week of 04/25/2018.  Please call oncology as needed prior to discharge.

## 2018-04-24 NOTE — Anesthesia Preprocedure Evaluation (Signed)
Anesthesia Evaluation  Patient identified by MRN, date of birth, ID band Patient awake    Reviewed: Allergy & Precautions, H&P , NPO status , Patient's Chart, lab work & pertinent test results  Airway Mallampati: II   Neck ROM: full    Dental   Pulmonary Current Smoker,    breath sounds clear to auscultation       Cardiovascular negative cardio ROS   Rhythm:regular Rate:Normal     Neuro/Psych    GI/Hepatic GERD  ,Right colon mass   Endo/Other    Renal/GU      Musculoskeletal   Abdominal   Peds  Hematology   Anesthesia Other Findings   Reproductive/Obstetrics                             Anesthesia Physical Anesthesia Plan  ASA: II  Anesthesia Plan: MAC   Post-op Pain Management:    Induction: Intravenous  PONV Risk Score and Plan: Propofol infusion and Treatment may vary due to age or medical condition  Airway Management Planned: Simple Face Mask  Additional Equipment:   Intra-op Plan:   Post-operative Plan:   Informed Consent: I have reviewed the patients History and Physical, chart, labs and discussed the procedure including the risks, benefits and alternatives for the proposed anesthesia with the patient or authorized representative who has indicated his/her understanding and acceptance.       Plan Discussed with: CRNA, Anesthesiologist and Surgeon  Anesthesia Plan Comments:         Anesthesia Quick Evaluation

## 2018-04-24 NOTE — Interval H&P Note (Signed)
History and Physical Interval Note: For colonoscopy this morning to evaluate cecal mass concerning for malignancy which appears widely metastatic The nature of the procedure, as well as the risks, benefits, and alternatives were carefully and thoroughly reviewed with the patient. Ample time for discussion and questions allowed. The patient understood, was satisfied, and agreed to proceed.   Lab Results  Component Value Date   WBC 10.8 (H) 04/23/2018   HGB 11.4 (L) 04/23/2018   HCT 35.4 (L) 04/23/2018   MCV 93.7 04/23/2018   PLT 204 04/23/2018     04/24/2018 11:04 AM  [name]  has presented today for surgery, with the diagnosis of Right colon mass on CT scan  The various methods of treatment have been discussed with the patient and family. After consideration of risks, benefits and other options for treatment, the patient has consented to  Procedure(s): COLONOSCOPY WITH PROPOFOL (N/A) as a surgical intervention .  The patient's history has been reviewed, patient examined, no change in status, stable for surgery.  I have reviewed the patient's chart and labs.  Questions were answered to the patient's satisfaction.     [name]

## 2018-04-25 ENCOUNTER — Telehealth: Payer: Self-pay | Admitting: *Deleted

## 2018-04-25 ENCOUNTER — Inpatient Hospital Stay (HOSPITAL_COMMUNITY): Payer: 59

## 2018-04-25 DIAGNOSIS — D122 Benign neoplasm of ascending colon: Secondary | ICD-10-CM

## 2018-04-25 DIAGNOSIS — D123 Benign neoplasm of transverse colon: Secondary | ICD-10-CM

## 2018-04-25 LAB — CHROMOGRANIN A: Chromogranin A (ng/mL): 350.5 ng/mL — ABNORMAL HIGH (ref 0.0–101.8)

## 2018-04-25 LAB — CEA: CEA1: 632 ng/mL — AB (ref 0.0–4.7)

## 2018-04-25 LAB — GLUCOSE, CAPILLARY: Glucose-Capillary: 99 mg/dL (ref 70–99)

## 2018-04-25 LAB — PROTIME-INR
INR: 1.13
Prothrombin Time: 14.4 seconds (ref 11.4–15.2)

## 2018-04-25 MED ORDER — FENTANYL CITRATE (PF) 100 MCG/2ML IJ SOLN
INTRAMUSCULAR | Status: AC
  Filled 2018-04-25: qty 4

## 2018-04-25 MED ORDER — MIDAZOLAM HCL 2 MG/2ML IJ SOLN
INTRAMUSCULAR | Status: AC | PRN
  Administered 2018-04-25: 1 mg via INTRAVENOUS

## 2018-04-25 MED ORDER — OXYCODONE-ACETAMINOPHEN 5-325 MG PO TABS: 1.0000 | ORAL_TABLET | Freq: Four times a day (QID) | ORAL | Status: DC | PRN

## 2018-04-25 MED ORDER — GELATIN ABSORBABLE 12-7 MM EX MISC
CUTANEOUS | Status: AC
  Filled 2018-04-25: qty 1

## 2018-04-25 MED ORDER — FENTANYL CITRATE (PF) 100 MCG/2ML IJ SOLN
INTRAMUSCULAR | Status: AC | PRN
  Administered 2018-04-25: 50 ug via INTRAVENOUS
  Administered 2018-04-25: 25 ug via INTRAVENOUS

## 2018-04-25 MED ORDER — MIDAZOLAM HCL 2 MG/2ML IJ SOLN
INTRAMUSCULAR | Status: AC
  Filled 2018-04-25: qty 4

## 2018-04-25 MED ORDER — OXYCODONE-ACETAMINOPHEN 5-325 MG PO TABS: 1.0000 | ORAL_TABLET | Freq: Four times a day (QID) | ORAL | 0 refills | Status: DC | PRN

## 2018-04-25 MED ORDER — LIDOCAINE-EPINEPHRINE 1 %-1:100000 IJ SOLN
INTRAMUSCULAR | Status: AC
  Filled 2018-04-25: qty 1

## 2018-04-25 NOTE — Procedures (Signed)
Pre Procedure Dx: Liver masses Post Procedural Dx: Same  Technically successful US guided biopsy of indeterminate infiltrative mass within the right lobe of the liver.  EBL: None  No immediate complications.   Ronny Bacon, MD Pager #: [phone]

## 2018-04-25 NOTE — Progress Notes (Signed)
IP PROGRESS NOTE  Subjective:   No new complaint.  He underwent a colonoscopy yesterday.  No colon mass was found.  Multiple polyps were removed.  Objective: Vital signs in last 24 hours: Blood pressure (!) 111/53, pulse 79, temperature 98.6 F (37 C), temperature source Oral, resp. rate 17, height 5\' 11"  (1.803 m), weight 185 lb 6.5 oz (84.1 kg), SpO2 95 %.  Intake/Output from previous day: 02/02 0701 - 02/03 0700 In: 62 [P.O.:622; I.V.:303] Out: 1025 [Urine:1025]  Physical Exam:  Abdomen: Marked hepatomegaly extending across the midline Extremities: Trace pitting edema at the left greater than right lower leg   Lab Results: Recent Labs    04/22/18 1556 04/23/18 0309  WBC 15.2* 10.8*  HGB 12.9* 11.4*  HCT 39.7 35.4*  PLT 319 204    BMET Recent Labs    04/22/18 1556 04/23/18 0309  NA 138 140  K 4.1 3.7  CL 102 104  CO2 21* 24  GLUCOSE 122* 112*  BUN 9 8  CREATININE 0.90 0.88  CALCIUM 8.1* 7.7*    No results found for: CEA1  Studies/Results: No results found.  Medications: I have reviewed the patient's current medications.  Assessment/Plan: 1.    Right lower quadrant mass extensive liver metastases  CTs 04/22/2018- right lower quadrant mass associated with the ileum and cecum, hepatomegaly with extensive liver metastases, multiple lung nodules consistent with metastases  Colonoscopy 04/24/2018- multiple benign-appearing polyps removed, no colon mass  2.  Lower extremity edema secondary to massive hepatomegaly and hypoalbuminemia  3.  Chronic back pain  Thomas Riley has marked hepatomegaly and multiple liver lesions.  A colonoscopy yesterday did not reveal a colon mass.  I discussed the differential diagnosis with Thomas Riley.  He may have metastatic disease to the liver from another GI tumor site such as appendiceal carcinoma, small bowel carcinoma, or an upper GI primary.  He could also have a metastatic carcinoid tumor.  CEA and chromogranin A  levels are pending.  I will cancel plans for Port-A-Cath placement today.  He will undergo a liver biopsy today.  Recommendations: 1.  Proceed with liver biopsy today 2.  Outpatient follow-up at the Cancer center 04/27/2018   LOS: 3 days   Betsy Coder, MD   04/25/2018, 6:25 AM

## 2018-04-25 NOTE — Discharge Summary (Addendum)
Physician Discharge Summary  Attikus Bartoszek IRW:[id number] DOB: 02-May-1955 DOA: 04/22/2018  PCP: Vivi Barrack, MD  Admit date: 04/22/2018 Discharge date: 04/25/2018  Admitted From: Home  Disposition:  Home   Recommendations for Outpatient Follow-up and new medication changes:  1. Follow up with Dr. Jerline Pain in 7 days.  2. Follow up with Dr. Benay Spice on 2.5.20 3. Biopsy results are pending.   Home Health: no   Equipment/Devices: no    Discharge Condition: stable  CODE STATUS: full  Diet recommendation: regular.   Brief/Interim Summary: 63 year old male who presented with leg edema. He does have significant past medical history for tobacco abuse, andchronic lowerback pain. Reported iInsidious worsening of his lower back pain over the last 2 months, with radiation to bilateral lower extremities. Recently he had noticed bilateral lowerextremity edema that has been associated with exertional dyspnea, and night sweats. No chest pain or angina. His symptoms were refractive to steroids and furosemide. His initial physical examination blood pressure 131/70, respiratory rate13, moist mucous membranes, lungs clear to auscultation bilaterally, heart S1-S2 present and rhythmic, abdomen mildly distended, no guarding, no tenderness, positive lower extremity edema bilaterally. Sodium was 138, potassium 4.1, chloride 102, bicarb 21, glucose 122, BUN 9, creatinine 0.90, white count 15.2, hemoglobin 12.9, hematocrit 39.7, platelets 319.Chest CT was negative for pulmonary embolism. He had scattered bilateral noncalcified pulmonary nodules measuring up to 4.7 mm. CT of the abdomen and pelvis showed a 4.3 x 3.1 x 4.7 cm exophytic enhancing mass within the right lower quadrant, intimately associated with an adjacent loop of ileum as well as cecum. Diffusely enlarged liver with innumerable hypodense lesions. 13 mm mesenteric lymph node within the right lower quadrant concerning for nodal metastasis. 15 mm  left adrenal nodule. EKG had sinus rhythm, left axis deviation, incomplete right bundle branch block, septal T wave inversions.  The patient was admitted to the hospital with a working diagnosis of newly diagnosed colonic and liver masses.  1.  Colonic mass with liver lesions, and mesenteric lymphadenopathy.  Very high pretest probability for new onset of metastatic colonic cancer.  Patient was admitted to the medical ward, he was worked up with colonoscopy which showed multiple polyps which were removed.  He underwent ultrasound-guided biopsy of liver lesion which result is still pending.  He will follow-up with oncology February 5.  By the time of discharge patient has been tolerating well p.o. diet, no abdominal pain, nausea or vomiting.   2.  Lower extremity edema with anasarca.  Edema was refractive to outpatient diuretics, further work-up with Doppler ultrasonography was negative for deep vein thrombosis.  Patient will follow-up as an outpatient. Lower extremity edema possiblly increased venous pressure in the inferior vena cava.  3.  Reactive leukocytosis.  No signs of infection, sepsis ruled out, white cell count 10.8.  4.  GERD.  Patient was continued on antiacid therapy.  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[facility])   Benign neoplasm of ascending colon   Benign neoplasm of transverse colon   Benign neoplasm of descending colon   Benign neoplasm of sigmoid colon    Discharge Instructions   Allergies as of 04/25/2018   No Known Allergies     Medication List    STOP taking these medications   acetaminophen 325  MG tablet Commonly known as:  TYLENOL   furosemide 20 MG tablet Commonly known as:  LASIX   naproxen 500 MG tablet Commonly known as:  NAPROSYN   predniSONE 10  MG tablet Commonly known as:  DELTASONE     TAKE these medications   omeprazole 20 MG capsule Commonly known as:  PRILOSEC Take 20 mg by mouth daily.   oxyCODONE-acetaminophen 5-325 MG tablet Commonly known as:  PERCOCET/ROXICET Take 1 tablet by mouth every 6 (six) hours as needed for severe pain.       No Known Allergies  Consultations:  GI   Oncology   Interventional radiology    Procedures/Studies: Dg Chest 2 View  Result Date: 04/22/2018 CLINICAL DATA:  Feet and ankle swelling. EXAM: CHEST - 2 VIEW COMPARISON:  No prior. FINDINGS: Mediastinum hilar structures normal. Heart size normal. Mild right upper lobe infiltrate. Mild bibasilar atelectasis. Follow-up chest x-ray to demonstrate clearing suggested. No pleural effusion or pneumothorax. IMPRESSION: Mild right upper lobe infiltrate. Mild bibasilar atelectasis. Follow-up chest x-rays to demonstrate clearing suggested. Followup PA and lateral chest X-ray is recommended in 3-4 weeks following trial of antibiotic therapy to ensure resolution and exclude underlying malignancy. Electronically Signed   By: Marcello Moores  Register   On: 04/22/2018 16:30   Ct Angio Chest Pe W And/or Wo Contrast  Result Date: 04/22/2018 CLINICAL DATA:  Dyspnea ongoing for 2-3 weeks. Bilateral lower extremity swelling onset 5 days ago. EXAM: CT ANGIOGRAPHY CHEST WITH CONTRAST TECHNIQUE: Multidetector CT imaging of the chest was performed using the standard protocol during bolus administration of intravenous contrast. Multiplanar CT image reconstructions and MIPs were obtained to evaluate the vascular anatomy. CONTRAST:  55 cc ISOVUE-370 IOPAMIDOL (ISOVUE-370) INJECTION 76% COMPARISON:  CXR 04/22/2018 FINDINGS: Cardiovascular: Conventional branch pattern of the great vessels with atherosclerosis of the left subclavian artery. Nonaneurysmal minimally atherosclerotic thoracic aorta. No dissection. Satisfactory opacification of the pulmonary arteries to the  segmental level without acute pulmonary embolus. Heart size is top normal. No pericardial effusion or thickening. No significant calcific coronary arteriosclerosis. Mediastinum/Nodes: No enlarged mediastinal, hilar, or axillary lymph nodes. Thyroid gland, trachea, and esophagus demonstrate no significant findings. Lungs/Pleura: Scattered noncalcified pulmonary nodules are identified bilaterally, measuring up to 4.7 mm. Two of these are seen in the right upper lobe anterior segment (4.6 mm each) series 6/71, one in the right middle lobe (4.7 mm in average), image 68, one in the left upper lobe (4.6 mm) image 72 and a 3 mm nodule in the right lower lobe, image 86. Dependent bibasilar atelectasis is noted. No pneumothorax or pulmonary consolidations. Trachea and mainstem bronchi appear patent. Upper Abdomen: The liver is enlarged and in homogeneous in appearance. Subtle underlying hypodense masses are not excluded but suboptimally characterized on this exam. A calcified granuloma is seen in the left hepatic lobe. The included left adrenal gland is unremarkable. The right adrenal gland is excluded. Musculoskeletal: No chest wall mass. No aggressive osseous lesion of the bony thorax. No acute fracture. Review of the MIP images confirms the above findings. IMPRESSION: 1. No acute pulmonary embolus, aortic aneurysm or dissection. 2. Scattered bilateral noncalcified pulmonary nodules measuring up to 4.7 mm. This in conjunction with an enlarged heterogeneous, abnormal appearance of the liver with subtle hypodense masslike abnormalities within raise concern for possible metastatic disease. Dedicated CT of the abdomen and pelvis with IV contrast is recommended for further correlation. Aortic Atherosclerosis (ICD10-I70.0). Electronically Signed   By: Ashley Royalty M.D.   On: 04/22/2018 19:01  Ct Abdomen Pelvis W Contrast  Result Date: 04/22/2018 CLINICAL DATA:  Initial evaluation for acute abdominal distension. EXAM: CT  ABDOMEN AND PELVIS WITH CONTRAST TECHNIQUE: Multidetector CT imaging of the abdomen and pelvis was performed using the standard protocol following bolus administration of intravenous contrast. CONTRAST:  143mL OMNIPAQUE IOHEXOL 300 MG/ML  SOLN COMPARISON:  Prior CTA from earlier the same day. FINDINGS: Lower chest: Mild scattered subsegmental atelectatic changes present within the lung bases. 5 mm nodule within the lower right lung, better evaluated on recent chest CT. Small pericardial effusion. Hepatobiliary: Liver is diffusely enlarged with innumerable hypodense lesions scattered throughout both the right and left hepatic lobes, highly concerning for widespread hepatic metastases. Liver demonstrates a abnormal nodular contour or. For reference purposes, the dominant lesion position within the central aspect of the liver measures approximately 7.3 x 6.2 cm (series 3, image 40). Partially distended gallbladder without acute abnormality. Trace pericholecystic fluid noted, favored to be related intrinsic liver disease. No biliary dilatation. Pancreas: Pancreas within normal limits. Spleen: Spleen within normal limits. Adrenals/Urinary Tract: 15 mm left adrenal nodule, indeterminate. Right adrenal gland grossly unremarkable, although poorly visualized due to mass effect by the adjacent liver. Kidneys fairly equal in size with symmetric enhancement. Subcentimeter hypodensity at the lower pole of the right kidney too small the characterize. No visible nephrolithiasis or hydronephrosis. No focal enhancing renal mass. Contrast material within the renal collecting systems bilaterally. No hydroureter. Partially distended bladder without acute abnormality. Secreted IV contrast material within the bladder lumen. Stomach/Bowel: Stomach displaced towards the left upper quadrant by the enlarged liver, but otherwise unremarkable without acute finding. No evidence for small bowel obstruction. There is an irregular exophytic  enhancing mass within the right lower quadrant measuring approximately 4.3 x 3.1 x 4.7 cm (series 3, image 66). Mass is intimately associated with an adjacent loop of ileum as well as the cecum, making it not entirely clear from which the lesion is arising from, although cecal origin is favored. Additionally, the appendix is not definitely visualized, raising the possibility for an appendiceal origin which could be considered as well. Surrounding inflammatory stranding and induration within the adjacent mesenteric fat. No other acute inflammatory changes about the bowels. Few scattered colonic diverticula noted. Vascular/Lymphatic: Normal intravascular enhancement seen throughout the intra-abdominal aorta. Mild to moderate aorto bi-iliac atherosclerotic disease. No aneurysm. Mesenteric vessels patent proximally. Enlarged 13 mm mesenteric lymph node within the right lower quadrant, concerning for nodal metastasis (series 3, image 66). No other pathologically enlarged lymph nodes identified within the abdomen and pelvis. Reproductive: Prostate normal. Other: Small volume mildly complex free fluid within the pelvis. No free intraperitoneal air. Musculoskeletal: Mild diffuse anasarca within the external soft tissues, likely related to intrinsic liver disease. No acute osseous abnormality. No discrete lytic or blastic osseous lesions. IMPRESSION: 1. 4.3 x 3.1 x 4.7 cm exophytic enhancing mass within the right lower quadrant, intimately associated with an adjacent loop of ileum as well as the cecum, making it not entirely clear from which the lesion is arising from, although cecal origin is perhaps favored. Additionally, the appendix is not definitely visualized, raising the possibility for an appendiceal origin which could be considered as well. 2. Diffusely enlarged liver with innumerable hypodense lesions, concerning for widespread hepatic metastases. 3. Enlarged 13 mm mesenteric lymph node within the right lower  quadrant, concerning for nodal metastasis. 4. 15 mm left adrenal nodule, indeterminate. 5. 5 mm right lower lobe pulmonary nodule, better evaluated on recent chest CT, but concerning  for metastatic disease. 6. Small volume free fluid within the pelvis with mild diffuse anasarca. Electronically Signed   By: Jeannine Boga M.D.   On: 04/22/2018 20:47   US Biopsy (liver)  Result Date: 04/25/2018 INDICATION: No known primary, now with multiple lesions/masses throughout the liver worrisome for metastatic disease. Please from ultrasound-guided biopsy for tissue diagnostic purposes. EXAM: ULTRASOUND GUIDED LIVER LESION BIOPSY COMPARISON:  CT of the chest, abdomen and pelvis-04/22/2018 MEDICATIONS: None ANESTHESIA/SEDATION: Fentanyl 75 mcg IV; Versed 1 mg IV Total Moderate Sedation time:  14 Minutes. The patient's level of consciousness and vital signs were monitored continuously by radiology nursing throughout the procedure under my direct supervision. COMPLICATIONS: None immediate. PROCEDURE: Informed written consent was obtained from the patient after a discussion of the risks, benefits and alternatives to treatment. The patient understands and consents the procedure. A timeout was performed prior to the initiation of the procedure. Ultrasound scanning was performed of the right upper abdominal quadrant demonstrates multiple mixed echogenic lesions/masses scattered throughout the entirety of the liver. A dominant centrally cystic infiltrative mass involving the caudal aspect the right lobe of the liver measuring approximately 6.1 x 6.1 cm (image 7), correlating with the infiltrative mass seen on preceding abdominal CT image 51, series 3, was targeted given lesion location and sonographic window. The procedure was planned. The right upper abdominal quadrant was prepped and draped in the usual sterile fashion. The overlying soft tissues were anesthetized with 1% lidocaine with epinephrine. A 17 gauge, 6.8 cm  co-axial needle was advanced into a peripheral aspect of the lesion. At the initial coaxial needle placement, approximately 3 cc of blood tinged fluid was aspirated from the dominant cystic component of the infiltrative mass. All cystic fluid was capped and sent to the laboratory for analysis. Next, 6 core needle biopsies were obtained of the ill-defined infiltrative mass with an 18 gauge core device under direct ultrasound guidance. Multiple ultrasound images were saved for procedural documentation purposes. The coaxial needle tract was embolized with a small amount of Gel-Foam slurry and superficial hemostasis was obtained with manual compression. Post procedural scanning was negative for definitive area of hemorrhage or additional complication. A dressing was placed. The patient tolerated the procedure well without immediate post procedural complication. IMPRESSION: Technically successful ultrasound guided core needle biopsy of infiltrative mass within the caudal aspect of the right lobe of the liver. Electronically Signed   By: Sandi Mariscal M.D.   On: 04/25/2018 11:03   Vas [place] Lower Extremity Venous (dvt) (only [facility])  Result Date: 04/23/2018  Lower Venous Study Indications: Pitting edema.  Performing Technologist: June Leap RDMS, RVT  Examination Guidelines: A complete evaluation includes B-mode imaging, spectral Doppler, color Doppler, and power Doppler as needed of all accessible portions of each vessel. Bilateral testing is considered an integral part of a complete examination. Limited examinations for reoccurring indications may be performed as noted.  Right Venous Findings: +---------+---------------+---------+-----------+----------+-------+          CompressibilityPhasicitySpontaneityPropertiesSummary +---------+---------------+---------+-----------+----------+-------+ CFV      Full           Yes      Yes                           +---------+---------------+---------+-----------+----------+-------+ SFJ      Full                                                 +---------+---------------+---------+-----------+----------+-------+  FV Prox  Full                                                 +---------+---------------+---------+-----------+----------+-------+ FV Mid   Full                                                 +---------+---------------+---------+-----------+----------+-------+ FV DistalFull                                                 +---------+---------------+---------+-----------+----------+-------+ PFV      Full                                                 +---------+---------------+---------+-----------+----------+-------+ POP      Full           Yes      Yes                          +---------+---------------+---------+-----------+----------+-------+ PTV      Full                                                 +---------+---------------+---------+-----------+----------+-------+ PERO     Full                                                 +---------+---------------+---------+-----------+----------+-------+  Left Venous Findings: +---------+---------------+---------+-----------+----------+-------+          CompressibilityPhasicitySpontaneityPropertiesSummary +---------+---------------+---------+-----------+----------+-------+ CFV      Full           Yes      Yes                          +---------+---------------+---------+-----------+----------+-------+ SFJ      Full                                                 +---------+---------------+---------+-----------+----------+-------+ FV Prox  Full                                                 +---------+---------------+---------+-----------+----------+-------+ FV Mid   Full                                                  +---------+---------------+---------+-----------+----------+-------+  FV DistalFull                                                 +---------+---------------+---------+-----------+----------+-------+ PFV      Full                                                 +---------+---------------+---------+-----------+----------+-------+ POP      Full           Yes      Yes                          +---------+---------------+---------+-----------+----------+-------+ PTV      Full                                                 +---------+---------------+---------+-----------+----------+-------+ PERO     Full                                                 +---------+---------------+---------+-----------+----------+-------+    Summary: Right: There is no evidence of deep vein thrombosis in the lower extremity. No cystic structure found in the popliteal fossa. Left: There is no evidence of deep vein thrombosis in the lower extremity. No cystic structure found in the popliteal fossa.  *See table(s) above for measurements and observations. Electronically signed by Curt Jews MD on 04/23/2018 at 3:32:00 PM.    Final        Subjective: Patient is tolerating po, no nausea or vomiting, no dyspnea. Improved lower extremity edema.   Discharge Exam: Vitals:   04/25/18 1020 04/25/18 1035  BP: 124/68 131/63  Pulse: 90 95  Resp: 16 17  Temp:  98 F (36.7 C)  SpO2: 97% 98%   Vitals:   04/25/18 1005 04/25/18 1010 04/25/18 1020 04/25/18 1035  BP: 126/64 129/65 124/68 131/63  Pulse: 90 92 90 95  Resp: 14 16 16 17   Temp:    98 F (36.7 C)  TempSrc:    Oral  SpO2: 99% 97% 97% 98%  Weight:      Height:        General: Not in pain or dyspnea Neurology: Awake and alert, non focal  E ENT: mild pallor, no icterus, oral mucosa moist Cardiovascular: No JVD. S1-S2 present, rhythmic, no gallops, rubs, or murmurs. ++ bilateral lower extremity edema. Pulmonary: vesicular breath sounds  bilaterally, adequate air movement, no wheezing, rhonchi or rales. Gastrointestinal. Abdomen with no organomegaly, non tender, no rebound or guarding Skin. No rashes Musculoskeletal: no joint deformities   The results of significant diagnostics from this hospitalization (including imaging, microbiology, ancillary and laboratory) are listed below for reference.     Microbiology: No results found for this or any previous visit (from the past 240 hour(s)).   Labs: BNP (last 3 results) Recent Labs    04/22/18 1605  BNP 607.3*   Basic Metabolic Panel: Recent Labs  Lab 04/22/18 1556 04/23/18 0309  NA 138 140  K 4.1 3.7  CL 102 104  CO2 21* 24  GLUCOSE 122* 112*  BUN 9 8  CREATININE 0.90 0.88  CALCIUM 8.1* 7.7*   Liver Function Tests: Recent Labs  Lab 04/22/18 1905 04/23/18 0309  AST 93* 86*  ALT 53* 42  ALKPHOS 343* 298*  BILITOT 1.3* 1.3*  PROT 7.2 5.7*  ALBUMIN 2.9* 2.4*   No results for input(s): LIPASE, AMYLASE in the last 168 hours. No results for input(s): AMMONIA in the last 168 hours. CBC: Recent Labs  Lab 04/22/18 1556 04/23/18 0309  WBC 15.2* 10.8*  NEUTROABS  --  8.6*  HGB 12.9* 11.4*  HCT 39.7 35.4*  MCV 95.2 93.7  PLT 319 204   Cardiac Enzymes: No results for input(s): CKTOTAL, CKMB, CKMBINDEX, TROPONINI in the last 168 hours. BNP: Invalid input(s): POCBNP CBG: Recent Labs  Lab 04/23/18 0817 04/24/18 0738 04/25/18 0832  GLUCAP 102* 96 99   D-Dimer No results for input(s): DDIMER in the last 72 hours. Hgb A1c No results for input(s): HGBA1C in the last 72 hours. Lipid Profile No results for input(s): CHOL, HDL, LDLCALC, TRIG, CHOLHDL, LDLDIRECT in the last 72 hours. Thyroid function studies No results for input(s): TSH, T4TOTAL, T3FREE, THYROIDAB in the last 72 hours.  Invalid input(s): FREET3 Anemia work up No results for input(s): VITAMINB12, FOLATE, FERRITIN, TIBC, IRON, RETICCTPCT in the last 72 hours. Urinalysis No results  found for: COLORURINE, APPEARANCEUR, LABSPEC, [place], GLUCOSEU, HGBUR, BILIRUBINUR, KETONESUR, PROTEINUR, UROBILINOGEN, NITRITE, LEUKOCYTESUR Sepsis Labs Invalid input(s): PROCALCITONIN,  WBC,  LACTICIDVEN Microbiology No results found for this or any previous visit (from the past 240 hour(s)).   Time coordinating discharge: 45 minutes  SIGNED:   Tawni Millers, MD  [facility] 04/25/2018, 12:02 PM

## 2018-04-25 NOTE — Sedation Documentation (Signed)
Patient denies pain and is resting comfortably.  

## 2018-04-25 NOTE — Telephone Encounter (Signed)
Wife call to confirm the appointment on 04/27/18 with [name] is also with Dr. Benay Spice. This was confirmed with her. Instructed them to arrive 15 minutes early to check in and they will see NP and MD.

## 2018-04-26 ENCOUNTER — Encounter: Payer: Self-pay | Admitting: Internal Medicine

## 2018-04-27 ENCOUNTER — Inpatient Hospital Stay: Payer: 59 | Attending: Nurse Practitioner | Admitting: Nurse Practitioner

## 2018-04-27 ENCOUNTER — Other Ambulatory Visit: Payer: Self-pay | Admitting: Nurse Practitioner

## 2018-04-27 ENCOUNTER — Inpatient Hospital Stay: Payer: 59

## 2018-04-27 ENCOUNTER — Other Ambulatory Visit: Payer: 59

## 2018-04-27 ENCOUNTER — Ambulatory Visit (HOSPITAL_COMMUNITY)
Admission: RE | Admit: 2018-04-27 | Discharge: 2018-04-27 | Disposition: A | Discharge status: Home / Self Care | Payer: 59 | Source: Ambulatory Visit | Attending: Nurse Practitioner | Admitting: Nurse Practitioner

## 2018-04-27 ENCOUNTER — Encounter: Payer: Self-pay | Admitting: Nurse Practitioner

## 2018-04-27 VITALS — BP 128/75 | HR 109 | Temp 98.4°F | Resp 19 | Ht 71.0 in | Wt 187.8 lb

## 2018-04-27 DIAGNOSIS — M549 Dorsalgia, unspecified: Secondary | ICD-10-CM | POA: Insufficient documentation

## 2018-04-27 DIAGNOSIS — C787 Secondary malignant neoplasm of liver and intrahepatic bile duct: Secondary | ICD-10-CM

## 2018-04-27 DIAGNOSIS — K1230 Oral mucositis (ulcerative), unspecified: Secondary | ICD-10-CM | POA: Diagnosis not present

## 2018-04-27 DIAGNOSIS — R918 Other nonspecific abnormal finding of lung field: Secondary | ICD-10-CM | POA: Diagnosis not present

## 2018-04-27 DIAGNOSIS — R6881 Early satiety: Secondary | ICD-10-CM

## 2018-04-27 DIAGNOSIS — R531 Weakness: Secondary | ICD-10-CM | POA: Diagnosis not present

## 2018-04-27 DIAGNOSIS — R1903 Right lower quadrant abdominal swelling, mass and lump: Secondary | ICD-10-CM | POA: Insufficient documentation

## 2018-04-27 DIAGNOSIS — R600 Localized edema: Secondary | ICD-10-CM | POA: Diagnosis not present

## 2018-04-27 DIAGNOSIS — C7800 Secondary malignant neoplasm of unspecified lung: Secondary | ICD-10-CM | POA: Diagnosis not present

## 2018-04-27 DIAGNOSIS — C801 Malignant (primary) neoplasm, unspecified: Secondary | ICD-10-CM | POA: Insufficient documentation

## 2018-04-27 DIAGNOSIS — E876 Hypokalemia: Secondary | ICD-10-CM | POA: Diagnosis not present

## 2018-04-27 DIAGNOSIS — Z5111 Encounter for antineoplastic chemotherapy: Secondary | ICD-10-CM | POA: Insufficient documentation

## 2018-04-27 DIAGNOSIS — Z79899 Other long term (current) drug therapy: Secondary | ICD-10-CM | POA: Diagnosis not present

## 2018-04-27 DIAGNOSIS — R109 Unspecified abdominal pain: Secondary | ICD-10-CM | POA: Diagnosis not present

## 2018-04-27 DIAGNOSIS — G8929 Other chronic pain: Secondary | ICD-10-CM | POA: Insufficient documentation

## 2018-04-27 DIAGNOSIS — R110 Nausea: Secondary | ICD-10-CM | POA: Diagnosis not present

## 2018-04-27 DIAGNOSIS — B370 Candidal stomatitis: Secondary | ICD-10-CM

## 2018-04-27 DIAGNOSIS — C220 Liver cell carcinoma: Secondary | ICD-10-CM | POA: Diagnosis not present

## 2018-04-27 MED ORDER — LIDOCAINE HCL 1 % IJ SOLN
INTRAMUSCULAR | Status: DC | PRN
  Administered 2018-04-27: 5 mL via INTRADERMAL

## 2018-04-27 MED ORDER — PROCHLORPERAZINE MALEATE 10 MG PO TABS: 10.0000 mg | ORAL_TABLET | Freq: Four times a day (QID) | ORAL | 2 refills | Status: AC | PRN

## 2018-04-27 MED ORDER — HEPARIN SOD (PORK) LOCK FLUSH 100 UNIT/ML IV SOLN
INTRAVENOUS | Status: AC
  Filled 2018-04-27: qty 5

## 2018-04-27 MED ORDER — LIDOCAINE HCL 1 % IJ SOLN
INTRAMUSCULAR | Status: AC
  Filled 2018-04-27: qty 20

## 2018-04-27 MED ORDER — OXYCODONE-ACETAMINOPHEN 5-325 MG PO TABS: 1.0000 | ORAL_TABLET | ORAL | 0 refills | Status: DC | PRN

## 2018-04-27 NOTE — Progress Notes (Addendum)
[facility] OFFICE PROGRESS NOTE   Diagnosis: Metastatic adenocarcinoma involving the liver  INTERVAL HISTORY:   Thomas Riley returns for his first outpatient follow-up since discharge from the hospital.  He notes worsening of right-sided abdominal pain.  He is taking 1 Percocet tablet a few times a day with partial relief.  Bowels are moving.  He is taking Metamucil.  He denies nausea.  He reports appetite is currently about "70%".  He notes early satiety.  He continues to have leg swelling and weakness.  No bowel or bladder dysfunction.  Objective:  Vital signs in last 24 hours:  Blood pressure 128/75, pulse (!) 109, temperature 98.4 F (36.9 C), temperature source Oral, resp. rate 19, height 5\' 11"  (1.803 m), weight 187 lb 12.8 oz (85.2 kg), SpO2 98 %.    HEENT: Oral candidiasis. Resp: Breath sounds diminished right lower lung field. Cardio: Regular, tachycardic. GI: Liver palpable throughout the right abdomen extending to the left upper quadrant with associated tenderness. Vascular: Pitting edema at the lower legs bilaterally. Neuro: Lateral proximal leg weakness.  Lab Results:  Lab Results  Component Value Date   WBC 10.8 (H) 04/23/2018   HGB 11.4 (L) 04/23/2018   HCT 35.4 (L) 04/23/2018   MCV 93.7 04/23/2018   PLT 204 04/23/2018   NEUTROABS 8.6 (H) 04/23/2018    Imaging:  No results found.  Medications: I have reviewed the patient's current medications.  Assessment/Plan: 1.  Right lower quadrant mass extensive liver metastases  CTs 04/22/2018- right lower quadrant mass associated with the ileum and cecum, hepatomegaly with extensive liver metastases, multiple lung nodules consistent with metastases  Colonoscopy 04/24/2018- multiple benign-appearing polyps removed, no colon mass  04/24/2018 chromogranin A 350  04/24/2018 CEA 632  Biopsy liver lesion 04/25/2018- adenocarcinoma with extensive necrosis  2.Lower extremity edema secondary to massive  hepatomegaly and hypoalbuminemia  3.Chronic back pain  4.  Proximal leg weakness- deconditioning?,  Spinal stenosis?  Disposition: Thomas Riley is a 63 year old man with metastatic adenocarcinoma involving the liver.  Dr. Benay Spice has discussed the pathology report with the pathologist.  Special stains are consistent with a GI primary.  Dr. Benay Spice recommends initiation of treatment on an urgent basis with FOLFOX.  We reviewed potential toxicities associated with chemotherapy including bone marrow toxicity, nausea, hair loss.  We discussed potential toxicities associated with 5-fluorouracil including mouth sores, diarrhea, skin rash, hand-foot syndrome, skin hyperpigmentation, increased sensitivity to sun.  We reviewed potential toxicities associated with oxaliplatin including cold sensitivity, peripheral neuropathy and more rare occurrences such as acute laryngopharyngeal dysesthesia, diplopia, ataxia.  We also discussed the potential for an allergic reaction.  He agrees to proceed.  He will attend a chemotherapy education class tomorrow morning.  He is having a PICC line placed later today.  He will return for the first cycle of FOLFOX 04/28/2018. We are referring him for a staging PET scan.  Etiology of the leg weakness is unclear.  He understands to contact the office with increased weakness or onset of other neurologic symptoms.  For the abdominal pain he will increase Percocet to 1 to 2 tablets every 4 hours as needed. A new prescription was sent to his pharmacy.  We will see him in follow-up in 1 week.  He will contact the office in the interim as outlined above or with any other problems.  Patient seen with Dr. Benay Spice.  45 minutes were spent face-to-face at today's visit with the majority of that time involved in counseling/coordination of  care.  [name] ANP/GNP-BC   04/27/2018  1:48 PM This was a shared visit with [name].  Thomas Riley was interviewed and examined.  He has  been diagnosed with metastatic carcinoma involving the liver.  The immunohistochemical staining pattern is most consistent with a colorectal primary.  No colon mass was seen on the colonoscopy 04/24/2018.  He may have a subclinical colonic mass or appendiceal cancer.  Thomas Riley has advanced metastatic disease involving the liver.  I do not recommend waiting for further diagnostic evaluation prior to beginning systemic therapy.  He will begin FOLFOX chemotherapy tomorrow.  We reviewed potential toxicities associated with the FOLFOX regimen and he agrees to proceed.  We will add Avastin with cycle 2. He agrees to proceed. Thomas Riley will be referred for PICC placement, a chemotherapy teaching class, and cycle 1 FOLFOX.  He will also be referred for a PET scan to look for evidence of a primary tumor site and bone metastases.  Julieanne Manson, MD

## 2018-04-27 NOTE — Procedures (Signed)
Right single lumen basilic vein PICC placed.  Length 41 cm.  Tip SVC/right atrial junction.  No immediate complications.  Okay to use.  EBL less than 1 cc.  Medication used-1 percent lidocaine to skin and subcutaneous tissue.

## 2018-04-28 ENCOUNTER — Telehealth: Payer: Self-pay | Admitting: Nurse Practitioner

## 2018-04-28 ENCOUNTER — Inpatient Hospital Stay: Payer: 59

## 2018-04-28 ENCOUNTER — Telehealth: Payer: Self-pay

## 2018-04-28 ENCOUNTER — Other Ambulatory Visit: Payer: Self-pay | Admitting: Nurse Practitioner

## 2018-04-28 ENCOUNTER — Telehealth: Payer: Self-pay | Admitting: *Deleted

## 2018-04-28 VITALS — BP 99/64 | HR 103 | Resp 16

## 2018-04-28 DIAGNOSIS — C787 Secondary malignant neoplasm of liver and intrahepatic bile duct: Secondary | ICD-10-CM | POA: Diagnosis not present

## 2018-04-28 DIAGNOSIS — C189 Malignant neoplasm of colon, unspecified: Secondary | ICD-10-CM

## 2018-04-28 LAB — CBC WITH DIFFERENTIAL (CANCER CENTER ONLY)
Abs Immature Granulocytes: 0.12 10*3/uL — ABNORMAL HIGH (ref 0.00–0.07)
Basophils Absolute: 0 10*3/uL (ref 0.0–0.1)
Basophils Relative: 0 %
Eosinophils Absolute: 0.1 10*3/uL (ref 0.0–0.5)
Eosinophils Relative: 1 %
HCT: 36.2 % — ABNORMAL LOW (ref 39.0–52.0)
Hemoglobin: 12.1 g/dL — ABNORMAL LOW (ref 13.0–17.0)
Immature Granulocytes: 1 %
Lymphocytes Relative: 9 %
Lymphs Abs: 1.1 10*3/uL (ref 0.7–4.0)
MCH: 31.6 pg (ref 26.0–34.0)
MCHC: 33.4 g/dL (ref 30.0–36.0)
MCV: 94.5 fL (ref 80.0–100.0)
MONO ABS: 1 10*3/uL (ref 0.1–1.0)
Monocytes Relative: 8 %
Neutro Abs: 9.6 10*3/uL — ABNORMAL HIGH (ref 1.7–7.7)
Neutrophils Relative %: 81 %
Platelet Count: 289 10*3/uL (ref 150–400)
RBC: 3.83 MIL/uL — ABNORMAL LOW (ref 4.22–5.81)
RDW: 17.2 % — ABNORMAL HIGH (ref 11.5–15.5)
WBC Count: 12 10*3/uL — ABNORMAL HIGH (ref 4.0–10.5)
nRBC: 0 % (ref 0.0–0.2)

## 2018-04-28 LAB — CMP (CANCER CENTER ONLY)
ALT: 58 U/L — ABNORMAL HIGH (ref 0–44)
AST: 140 U/L — ABNORMAL HIGH (ref 15–41)
Albumin: 2.5 g/dL — ABNORMAL LOW (ref 3.5–5.0)
Alkaline Phosphatase: 403 U/L — ABNORMAL HIGH (ref 38–126)
Anion gap: 16 — ABNORMAL HIGH (ref 5–15)
BUN: 12 mg/dL (ref 8–23)
CALCIUM: 8.4 mg/dL — AB (ref 8.9–10.3)
CO2: 22 mmol/L (ref 22–32)
Chloride: 103 mmol/L (ref 98–111)
Creatinine: 1.02 mg/dL (ref 0.61–1.24)
GFR, Est AFR Am: >60 mL/min (ref >60)
GFR, Estimated: >60 mL/min (ref >60)
Glucose, Bld: 111 mg/dL — ABNORMAL HIGH (ref 70–99)
Potassium: 3.8 mmol/L (ref 3.5–5.1)
Sodium: 141 mmol/L (ref 135–145)
TOTAL PROTEIN: 6.5 g/dL (ref 6.5–8.1)
Total Bilirubin: 1.9 mg/dL — ABNORMAL HIGH (ref 0.3–1.2)

## 2018-04-28 MED ORDER — DEXTROSE 5 % IV SOLN
Freq: Once | INTRAVENOUS | Status: AC
  Administered 2018-04-28: 11:00:00 via INTRAVENOUS
  Filled 2018-04-28: qty 250

## 2018-04-28 MED ORDER — SODIUM CHLORIDE 0.9% FLUSH
10.0000 mL | INTRAVENOUS | Status: DC | PRN
  Filled 2018-04-28: qty 10

## 2018-04-28 MED ORDER — OXALIPLATIN CHEMO INJECTION 100 MG/20ML
85.0000 mg/m2 | Freq: Once | INTRAVENOUS | Status: AC
  Administered 2018-04-28: 175 mg via INTRAVENOUS
  Filled 2018-04-28: qty 35

## 2018-04-28 MED ORDER — DEXAMETHASONE SODIUM PHOSPHATE 10 MG/ML IJ SOLN
10.0000 mg | Freq: Once | INTRAMUSCULAR | Status: AC
  Administered 2018-04-28: 10 mg via INTRAVENOUS

## 2018-04-28 MED ORDER — HEPARIN SOD (PORK) LOCK FLUSH 100 UNIT/ML IV SOLN
250.0000 [IU] | Freq: Once | INTRAVENOUS | Status: DC | PRN
  Filled 2018-04-28: qty 5

## 2018-04-28 MED ORDER — SODIUM CHLORIDE 0.9 % IV SOLN: 10.0000 mg | Freq: Once | INTRAVENOUS | Status: DC

## 2018-04-28 MED ORDER — PALONOSETRON HCL INJECTION 0.25 MG/5ML
0.2500 mg | Freq: Once | INTRAVENOUS | Status: AC
  Administered 2018-04-28: 0.25 mg via INTRAVENOUS

## 2018-04-28 MED ORDER — DEXTROSE 5 % IV SOLN
Freq: Once | INTRAVENOUS | Status: AC
  Administered 2018-04-28: 10:00:00 via INTRAVENOUS
  Filled 2018-04-28: qty 250

## 2018-04-28 MED ORDER — SODIUM CHLORIDE 0.9 % IV SOLN
5000.0000 mg | INTRAVENOUS | Status: DC
  Administered 2018-04-28: 5000 mg via INTRAVENOUS
  Filled 2018-04-28: qty 100

## 2018-04-28 MED ORDER — DEXAMETHASONE SODIUM PHOSPHATE 10 MG/ML IJ SOLN
INTRAMUSCULAR | Status: AC
  Filled 2018-04-28: qty 1

## 2018-04-28 MED ORDER — LEUCOVORIN CALCIUM INJECTION 350 MG
400.0000 mg/m2 | Freq: Once | INTRAVENOUS | Status: AC
  Administered 2018-04-28: 820 mg via INTRAVENOUS
  Filled 2018-04-28: qty 41

## 2018-04-28 MED ORDER — PALONOSETRON HCL INJECTION 0.25 MG/5ML
INTRAVENOUS | Status: AC
  Filled 2018-04-28: qty 5

## 2018-04-28 NOTE — Telephone Encounter (Signed)
LM for patient to return call for hospital follow up call.

## 2018-04-28 NOTE — Telephone Encounter (Signed)
Medical records faxed to [facility]; IB#[id number]

## 2018-04-28 NOTE — Telephone Encounter (Signed)
Scheduled appt per 2/5 los - pt to get an updated schedule in the treatment area.

## 2018-04-28 NOTE — Patient Instructions (Signed)
Thomas Riley Discharge Instructions for Patients Receiving Chemotherapy  Today you received the following chemotherapy agentsFluorouracil, 5-FU injection What is this medicine? FLUOROURACIL, 5-FU (flure oh YOOR a sil) is a chemotherapy drug. It slows the growth of cancer cells. This medicine is used to treat many types of cancer like breast cancer, colon or rectal cancer, pancreatic cancer, and stomach cancer. This medicine may be used for other purposes; ask your health care provider or pharmacist if you have questions. COMMON BRAND NAME(S): Adrucil What should I tell my health care provider before I take this medicine? They need to know if you have any of these conditions: -blood disorders -dihydropyrimidine dehydrogenase (DPD) deficiency -infection (especially a virus infection such as chickenpox, cold sores, or herpes) -kidney disease -liver disease -malnourished, poor nutrition -recent or ongoing radiation therapy -an unusual or allergic reaction to fluorouracil, other chemotherapy, other medicines, foods, dyes, or preservatives -pregnant or trying to get pregnant -breast-feeding How should I use this medicine? This drug is given as an infusion or injection into a vein. It is administered in a hospital or clinic by a specially trained health care professional. Talk to your pediatrician regarding the use of this medicine in children. Special care may be needed. Overdosage: If you think you have taken too much of this medicine contact a poison control center or emergency room at once. NOTE: This medicine is only for you. Do not share this medicine with others. What if I miss a dose? It is important not to miss your dose. Call your doctor or health care professional if you are unable to keep an appointment. What may interact with this medicine? -allopurinol -cimetidine -dapsone -digoxin -hydroxyurea -leucovorin -levamisole -medicines for seizures like ethotoin,  fosphenytoin, phenytoin -medicines to increase blood counts like filgrastim, pegfilgrastim, sargramostim -medicines that treat or prevent blood clots like warfarin, enoxaparin, and dalteparin -methotrexate -metronidazole -pyrimethamine -some other chemotherapy drugs like busulfan, cisplatin, estramustine, vinblastine -trimethoprim -trimetrexate -vaccines Talk to your doctor or health care professional before taking any of these medicines: -acetaminophen -aspirin -ibuprofen -ketoprofen -naproxen This list may not describe all possible interactions. Give your health care provider a list of all the medicines, herbs, non-prescription drugs, or dietary supplements you use. Also tell them if you smoke, drink alcohol, or use illegal drugs. Some items may interact with your medicine. What should I watch for while using this medicine? Visit your doctor for checks on your progress. This drug may make you feel generally unwell. This is not uncommon, as chemotherapy can affect healthy cells as well as cancer cells. Report any side effects. Continue your course of treatment even though you feel ill unless your doctor tells you to stop. In some cases, you may be given additional medicines to help with side effects. Follow all directions for their use. Call your doctor or health care professional for advice if you get a fever, chills or sore throat, or other symptoms of a cold or flu. Do not treat yourself. This drug decreases your body's ability to fight infections. Try to avoid being around people who are sick. This medicine may increase your risk to bruise or bleed. Call your doctor or health care professional if you notice any unusual bleeding. Be careful brushing and flossing your teeth or using a toothpick because you may get an infection or bleed more easily. If you have any dental work done, tell your dentist you are receiving this medicine. Avoid taking products that contain aspirin, acetaminophen,  ibuprofen, naproxen, or ketoprofen  unless instructed by your doctor. These medicines may hide a fever. Do not become pregnant while taking this medicine. Women should inform their doctor if they wish to become pregnant or think they might be pregnant. There is a potential for serious side effects to an unborn child. Talk to your health care professional or pharmacist for more information. Do not breast-feed an infant while taking this medicine. Men should inform their doctor if they wish to father a child. This medicine may lower sperm counts. Do not treat diarrhea with over the counter products. Contact your doctor if you have diarrhea that lasts more than 2 days or if it is severe and watery. This medicine can make you more sensitive to the sun. Keep out of the sun. If you cannot avoid being in the sun, wear protective clothing and use sunscreen. Do not use sun lamps or tanning beds/booths. What side effects may I notice from receiving this medicine? Side effects that you should report to your doctor or health care professional as soon as possible: -allergic reactions like skin rash, itching or hives, swelling of the face, lips, or tongue -low blood counts - this medicine may decrease the number of white blood cells, red blood cells and platelets. You may be at increased risk for infections and bleeding. -signs of infection - fever or chills, cough, sore throat, pain or difficulty passing urine -signs of decreased platelets or bleeding - bruising, pinpoint red spots on the skin, black, tarry stools, blood in the urine -signs of decreased red blood cells - unusually weak or tired, fainting spells, lightheadedness -breathing problems -changes in vision -chest pain -mouth sores -nausea and vomiting -pain, swelling, redness at site where injected -pain, tingling, numbness in the hands or feet -redness, swelling, or sores on hands or feet -stomach pain -unusual bleeding Side effects that usually  do not require medical attention (report to your doctor or health care professional if they continue or are bothersome): -changes in finger or toe nails -diarrhea -dry or itchy skin -hair loss -headache -loss of appetite -sensitivity of eyes to the light -stomach upset -unusually teary eyes This list may not describe all possible side effects. Call your doctor for medical advice about side effects. You may report side effects to FDA at 1-800-FDA-1088. Where should I keep my medicine? This drug is given in a hospital or clinic and will not be stored at home. NOTE: This sheet is a summary. It may not cover all possible information. If you have questions about this medicine, talk to your doctor, pharmacist, or health care provider.  2019 Elsevier/Gold Standard (2007-07-13 13:53:16) Leucovorin injection What is this medicine? LEUCOVORIN (loo koe VOR in) is used to prevent or treat the harmful effects of some medicines. This medicine is used to treat anemia caused by a low amount of folic acid in the body. It is also used with 5-fluorouracil (5-FU) to treat colon cancer. This medicine may be used for other purposes; ask your health care provider or pharmacist if you have questions. What should I tell my health care provider before I take this medicine? They need to know if you have any of these conditions: -anemia from low levels of vitamin B-12 in the blood -an unusual or allergic reaction to leucovorin, folic acid, other medicines, foods, dyes, or preservatives -pregnant or trying to get pregnant -breast-feeding How should I use this medicine? This medicine is for injection into a muscle or into a vein. It is given by a health care  professional in a hospital or clinic setting. Talk to your pediatrician regarding the use of this medicine in children. Special care may be needed. Overdosage: If you think you have taken too much of this medicine contact a poison control center or emergency room  at once. NOTE: This medicine is only for you. Do not share this medicine with others. What if I miss a dose? This does not apply. What may interact with this medicine? -capecitabine -fluorouracil -phenobarbital -phenytoin -primidone -trimethoprim-sulfamethoxazole This list may not describe all possible interactions. Give your health care provider a list of all the medicines, herbs, non-prescription drugs, or dietary supplements you use. Also tell them if you smoke, drink alcohol, or use illegal drugs. Some items may interact with your medicine. What should I watch for while using this medicine? Your condition will be monitored carefully while you are receiving this medicine. This medicine may increase the side effects of 5-fluorouracil, 5-FU. Tell your doctor or health care professional if you have diarrhea or mouth sores that do not get better or that get worse. What side effects may I notice from receiving this medicine? Side effects that you should report to your doctor or health care professional as soon as possible: -allergic reactions like skin rash, itching or hives, swelling of the face, lips, or tongue -breathing problems -fever, infection -mouth sores -unusual bleeding or bruising -unusually weak or tired Side effects that usually do not require medical attention (report to your doctor or health care professional if they continue or are bothersome): -constipation or diarrhea -loss of appetite -nausea, vomiting This list may not describe all possible side effects. Call your doctor for medical advice about side effects. You may report side effects to FDA at 1-800-FDA-1088. Where should I keep my medicine? This drug is given in a hospital or clinic and will not be stored at home. NOTE: This sheet is a summary. It may not cover all possible information. If you have questions about this medicine, talk to your doctor, pharmacist, or health care provider.  2019 Elsevier/Gold  Standard (2007-09-13 16:50:29)  Leucovorin, 5- Fluororacil.Oxaliplatin Injection What is this medicine? OXALIPLATIN (ox AL i PLA tin) is a chemotherapy drug. It targets fast dividing cells, like cancer cells, and causes these cells to die. This medicine is used to treat cancers of the colon and rectum, and many other cancers. This medicine may be used for other purposes; ask your health care provider or pharmacist if you have questions. COMMON BRAND NAME(S): Eloxatin What should I tell my health care provider before I take this medicine? They need to know if you have any of these conditions: -kidney disease -an unusual or allergic reaction to oxaliplatin, other chemotherapy, other medicines, foods, dyes, or preservatives -pregnant or trying to get pregnant -breast-feeding How should I use this medicine? This drug is given as an infusion into a vein. It is administered in a hospital or clinic by a specially trained health care professional. Talk to your pediatrician regarding the use of this medicine in children. Special care may be needed. Overdosage: If you think you have taken too much of this medicine contact a poison control center or emergency room at once. NOTE: This medicine is only for you. Do not share this medicine with others. What if I miss a dose? It is important not to miss a dose. Call your doctor or health care professional if you are unable to keep an appointment. What may interact with this medicine? -medicines to increase blood counts like  filgrastim, pegfilgrastim, sargramostim -probenecid -some antibiotics like amikacin, gentamicin, neomycin, polymyxin B, streptomycin, tobramycin -zalcitabine Talk to your doctor or health care professional before taking any of these medicines: -acetaminophen -aspirin -ibuprofen -ketoprofen -naproxen This list may not describe all possible interactions. Give your health care provider a list of all the medicines, herbs,  non-prescription drugs, or dietary supplements you use. Also tell them if you smoke, drink alcohol, or use illegal drugs. Some items may interact with your medicine. What should I watch for while using this medicine? Your condition will be monitored carefully while you are receiving this medicine. You will need important blood work done while you are taking this medicine. This medicine can make you more sensitive to cold. Do not drink cold drinks or use ice. Cover exposed skin before coming in contact with cold temperatures or cold objects. When out in cold weather wear warm clothing and cover your mouth and nose to warm the air that goes into your lungs. Tell your doctor if you get sensitive to the cold. This drug may make you feel generally unwell. This is not uncommon, as chemotherapy can affect healthy cells as well as cancer cells. Report any side effects. Continue your course of treatment even though you feel ill unless your doctor tells you to stop. In some cases, you may be given additional medicines to help with side effects. Follow all directions for their use. Call your doctor or health care professional for advice if you get a fever, chills or sore throat, or other symptoms of a cold or flu. Do not treat yourself. This drug decreases your body's ability to fight infections. Try to avoid being around people who are sick. This medicine may increase your risk to bruise or bleed. Call your doctor or health care professional if you notice any unusual bleeding. Be careful brushing and flossing your teeth or using a toothpick because you may get an infection or bleed more easily. If you have any dental work done, tell your dentist you are receiving this medicine. Avoid taking products that contain aspirin, acetaminophen, ibuprofen, naproxen, or ketoprofen unless instructed by your doctor. These medicines may hide a fever. Do not become pregnant while taking this medicine. Women should inform their  doctor if they wish to become pregnant or think they might be pregnant. There is a potential for serious side effects to an unborn child. Talk to your health care professional or pharmacist for more information. Do not breast-feed an infant while taking this medicine. Call your doctor or health care professional if you get diarrhea. Do not treat yourself. What side effects may I notice from receiving this medicine? Side effects that you should report to your doctor or health care professional as soon as possible: -allergic reactions like skin rash, itching or hives, swelling of the face, lips, or tongue -low blood counts - This drug may decrease the number of white blood cells, red blood cells and platelets. You may be at increased risk for infections and bleeding. -signs of infection - fever or chills, cough, sore throat, pain or difficulty passing urine -signs of decreased platelets or bleeding - bruising, pinpoint red spots on the skin, black, tarry stools, nosebleeds -signs of decreased red blood cells - unusually weak or tired, fainting spells, lightheadedness -breathing problems -chest pain, pressure -cough -diarrhea -jaw tightness -mouth sores -nausea and vomiting -pain, swelling, redness or irritation at the injection site -pain, tingling, numbness in the hands or feet -problems with balance, talking, walking -redness, blistering,  peeling or loosening of the skin, including inside the mouth -trouble passing urine or change in the amount of urine Side effects that usually do not require medical attention (report to your doctor or health care professional if they continue or are bothersome): -changes in vision -constipation -hair loss -loss of appetite -metallic taste in the mouth or changes in taste -stomach pain This list may not describe all possible side effects. Call your doctor for medical advice about side effects. You may report side effects to FDA at 1-800-FDA-1088. Where  should I keep my medicine? This drug is given in a hospital or clinic and will not be stored at home. NOTE: This sheet is a summary. It may not cover all possible information. If you have questions about this medicine, talk to your doctor, pharmacist, or health care provider.  2019 Elsevier/Gold Standard (2007-10-04 17:22:47)   To help prevent nausea and vomiting after your treatment, we encourage you to take your nausea medication.   If you develop nausea and vomiting that is not controlled by your nausea medication, call the clinic.   BELOW ARE SYMPTOMS THAT SHOULD BE REPORTED IMMEDIATELY:  *FEVER GREATER THAN 100.5 F  *CHILLS WITH OR WITHOUT FEVER  NAUSEA AND VOMITING THAT IS NOT CONTROLLED WITH YOUR NAUSEA MEDICATION  *UNUSUAL SHORTNESS OF BREATH  *UNUSUAL BRUISING OR BLEEDING  TENDERNESS IN MOUTH AND THROAT WITH OR WITHOUT PRESENCE OF ULCERS  *URINARY PROBLEMS  *BOWEL PROBLEMS  UNUSUAL RASH Items with * indicate a potential emergency and should be followed up as soon as possible.  Feel free to call the clinic should you have any questions or concerns. The clinic phone number is [phone].  Please show the [facility] at check-in to the Emergency Department and triage nurse.

## 2018-04-28 NOTE — Progress Notes (Signed)
Okay to treat with all abnormal values this morning..Dr. Benay Spice reviewed.   First time treatment went well. No co from pt.Marland Kitchen

## 2018-04-28 NOTE — Telephone Encounter (Signed)
  Oncology Nurse Navigator Documentation   Thomas Riley called to clarify schedule and to ask questions about what to expect following chemo tx today. All questions answered to patient's satisfcation .        Thomas Snipe, MS Ed.S, RN -[facility]    Gastrointestinal Oncology Nurse [place] at [place]   [phone]

## 2018-04-28 NOTE — Telephone Encounter (Signed)
TC per Lattie Haw to [facility] [phone]) to request foundation 1 and IHC testing on the liver biopsy. Spoke with [employer]. Request complete.

## 2018-04-30 ENCOUNTER — Inpatient Hospital Stay: Payer: 59

## 2018-04-30 VITALS — BP 116/65 | HR 128 | Temp 98.4°F | Resp 16

## 2018-04-30 DIAGNOSIS — C787 Secondary malignant neoplasm of liver and intrahepatic bile duct: Secondary | ICD-10-CM | POA: Diagnosis not present

## 2018-04-30 DIAGNOSIS — C189 Malignant neoplasm of colon, unspecified: Secondary | ICD-10-CM

## 2018-04-30 MED ORDER — SODIUM CHLORIDE 0.9% FLUSH
10.0000 mL | INTRAVENOUS | Status: DC | PRN
  Administered 2018-04-30: 10 mL
  Filled 2018-04-30: qty 10

## 2018-04-30 MED ORDER — HEPARIN SOD (PORK) LOCK FLUSH 100 UNIT/ML IV SOLN
500.0000 [IU] | Freq: Once | INTRAVENOUS | Status: AC | PRN
  Administered 2018-04-30: 250 [IU]
  Filled 2018-04-30: qty 5

## 2018-04-30 NOTE — Patient Instructions (Signed)
Dehydration, Adult  Dehydration is a condition in which there is not enough fluid or water in the body. This happens when you lose more fluids than you take in. Important organs, such as the kidneys, brain, and heart, cannot function without a proper amount of fluids. Any loss of fluids from the body can lead to dehydration. Dehydration can range from mild to severe. This condition should be treated right away to prevent it from becoming severe. What are the causes? This condition may be caused by:  Vomiting.  Diarrhea.  Excessive sweating, such as from heat exposure or exercise.  Not drinking enough fluid, especially: ? When ill. ? While doing activity that requires a lot of energy.  Excessive urination.  Fever.  Infection.  Certain medicines, such as medicines that cause the body to lose excess fluid (diuretics).  Inability to access safe drinking water.  Reduced physical ability to get adequate water and food. What increases the risk? This condition is more likely to develop in people:  Who have a poorly controlled long-term (chronic) illness, such as diabetes, heart disease, or kidney disease.  Who are age 65 or older.  Who are disabled.  Who live in a place with high altitude.  Who play endurance sports. What are the signs or symptoms? Symptoms of mild dehydration may include:  Thirst.  Dry lips.  Slightly dry mouth.  Dry, warm skin.  Dizziness. Symptoms of moderate dehydration may include:  Very dry mouth.  Muscle cramps.  Dark urine. Urine may be the color of tea.  Decreased urine production.  Decreased tear production.  Heartbeat that is irregular or faster than normal (palpitations).  Headache.  Light-headedness, especially when you stand up from a sitting position.  Fainting (syncope). Symptoms of severe dehydration may include:  Changes in skin, such as: ? Cold and clammy skin. ? Blotchy (mottled) or pale skin. ? Skin that does  not quickly return to normal after being lightly pinched and released (poor skin turgor).  Changes in body fluids, such as: ? Extreme thirst. ? No tear production. ? Inability to sweat when body temperature is high, such as in hot weather. ? Very little urine production.  Changes in vital signs, such as: ? Weak pulse. ? Pulse that is more than 100 beats a minute when sitting still. ? Rapid breathing. ? Low blood pressure.  Other changes, such as: ? Sunken eyes. ? Cold hands and feet. ? Confusion. ? Lack of energy (lethargy). ? Difficulty waking up from sleep. ? Short-term weight loss. ? Unconsciousness. How is this diagnosed? This condition is diagnosed based on your symptoms and a physical exam. Blood and urine tests may be done to help confirm the diagnosis. How is this treated? Treatment for this condition depends on the severity. Mild or moderate dehydration can often be treated at home. Treatment should be started right away. Do not wait until dehydration becomes severe. Severe dehydration is an emergency and it needs to be treated in a hospital. Treatment for mild dehydration may include:  Drinking more fluids.  Replacing salts and minerals in your blood (electrolytes) that you may have lost. Treatment for moderate dehydration may include:  Drinking an oral rehydration solution (ORS). This is a drink that helps you replace fluids and electrolytes (rehydrate). It can be found at pharmacies and retail stores. Treatment for severe dehydration may include:  Receiving fluids through an IV tube.  Receiving an electrolyte solution through a feeding tube that is passed through your nose and   into your stomach (nasogastric tube, or NG tube).  Correcting any abnormalities in electrolytes.  Treating the underlying cause of dehydration. Follow these instructions at home:  If directed by your health care provider, drink an ORS: ? Make an ORS by following instructions on the  package. ? Start by drinking small amounts, about  cup (120 mL) every 5-10 minutes. ? Slowly increase how much you drink until you have taken the amount recommended by your health care provider.  Drink enough clear fluid to keep your urine clear or pale yellow. If you were told to drink an ORS, finish the ORS first, then start slowly drinking other clear fluids. Drink fluids such as: ? Water. Do not drink only water. Doing that can lead to having too little salt (sodium) in the body (hyponatremia). ? Ice chips. ? Fruit juice that you have added water to (diluted fruit juice). ? Low-calorie sports drinks.  Avoid: ? Alcohol. ? Drinks that contain a lot of sugar. These include high-calorie sports drinks, fruit juice that is not diluted, and soda. ? Caffeine. ? Foods that are greasy or contain a lot of fat or sugar.  Take over-the-counter and prescription medicines only as told by your health care provider.  Do not take sodium tablets. This can lead to having too much sodium in the body (hypernatremia).  Eat foods that contain a healthy balance of electrolytes, such as bananas, oranges, potatoes, tomatoes, and spinach.  Keep all follow-up visits as told by your health care provider. This is important. Contact a health care provider if:  You have abdominal pain that: ? Gets worse. ? Stays in one area (localizes).  You have a rash.  You have a stiff neck.  You are more irritable than usual.  You are sleepier or more difficult to wake up than usual.  You feel weak or dizzy.  You feel very thirsty.  You have urinated only a small amount of very dark urine over 6-8 hours. Get help right away if:  You have symptoms of severe dehydration.  You cannot drink fluids without vomiting.  Your symptoms get worse with treatment.  You have a fever.  You have a severe headache.  You have vomiting or diarrhea that: ? Gets worse. ? Does not go away.  You have blood or green matter  (bile) in your vomit.  You have blood in your stool. This may cause stool to look black and tarry.  You have not urinated in 6-8 hours.  You faint.  Your heart rate while sitting still is over 100 beats a minute.  You have trouble breathing. This information is not intended to replace advice given to you by your health care provider. Make sure you discuss any questions you have with your health care provider. Document Released: 03/09/2005 Document Revised: 10/04/2015 Document Reviewed: 05/03/2015 Elsevier Interactive Patient Education  2019 Elsevier Inc.  

## 2018-04-30 NOTE — Progress Notes (Signed)
Tachycardia-radial and apical pulse 120 and regular . Pt asymptomatic. No nausea/vomiting,diarrhea. I Instructed pt to drink more water Gatorade, non caffeine drinks and if he has worsening, sob, feels heart racing and or pain he needs to go to ED. He voiced understanding.

## 2018-05-02 ENCOUNTER — Telehealth: Payer: Self-pay

## 2018-05-02 ENCOUNTER — Encounter: Payer: Self-pay | Admitting: Pharmacist

## 2018-05-02 NOTE — Telephone Encounter (Signed)
  Oncology Nurse Navigator Documentation   Called Thomas Riley to let her know that PET has not been authorized as of yet [employer] Bidding in managed care is working on getting the [place].

## 2018-05-03 ENCOUNTER — Telehealth: Payer: Self-pay | Admitting: *Deleted

## 2018-05-03 NOTE — Telephone Encounter (Signed)
Wife reports his BLE edema is getting worse despite keeping his legs elevated at least 90% of the day. Feel uncomfortable and aching. Not red or hot to touch and both are swollen equally. Has appointment on 2/14 and asking if he needs to be seen sooner or if there is anything that can be done to help? She reports he does not have excess sodium intake.

## 2018-05-04 NOTE — Telephone Encounter (Signed)
Informed wife that per Dr. Benay Spice: Elevation is the best at this time. Hopefully will improve over time with treatment. F/U on 2/14 as scheduled.

## 2018-05-06 ENCOUNTER — Inpatient Hospital Stay: Payer: 59

## 2018-05-06 ENCOUNTER — Telehealth: Payer: Self-pay

## 2018-05-06 ENCOUNTER — Inpatient Hospital Stay (HOSPITAL_BASED_OUTPATIENT_CLINIC_OR_DEPARTMENT_OTHER): Payer: 59 | Admitting: Nurse Practitioner

## 2018-05-06 ENCOUNTER — Encounter: Payer: Self-pay | Admitting: Nurse Practitioner

## 2018-05-06 VITALS — BP 112/73 | HR 119 | Temp 98.4°F | Resp 19 | Ht 71.0 in | Wt 188.8 lb

## 2018-05-06 DIAGNOSIS — Z79899 Other long term (current) drug therapy: Secondary | ICD-10-CM

## 2018-05-06 DIAGNOSIS — C787 Secondary malignant neoplasm of liver and intrahepatic bile duct: Secondary | ICD-10-CM | POA: Diagnosis not present

## 2018-05-06 DIAGNOSIS — M549 Dorsalgia, unspecified: Secondary | ICD-10-CM

## 2018-05-06 DIAGNOSIS — R110 Nausea: Secondary | ICD-10-CM

## 2018-05-06 DIAGNOSIS — C189 Malignant neoplasm of colon, unspecified: Secondary | ICD-10-CM

## 2018-05-06 DIAGNOSIS — C801 Malignant (primary) neoplasm, unspecified: Secondary | ICD-10-CM

## 2018-05-06 DIAGNOSIS — R1903 Right lower quadrant abdominal swelling, mass and lump: Secondary | ICD-10-CM | POA: Diagnosis not present

## 2018-05-06 DIAGNOSIS — K1230 Oral mucositis (ulcerative), unspecified: Secondary | ICD-10-CM

## 2018-05-06 DIAGNOSIS — R600 Localized edema: Secondary | ICD-10-CM

## 2018-05-06 DIAGNOSIS — R109 Unspecified abdominal pain: Secondary | ICD-10-CM

## 2018-05-06 DIAGNOSIS — C7800 Secondary malignant neoplasm of unspecified lung: Secondary | ICD-10-CM | POA: Diagnosis not present

## 2018-05-06 DIAGNOSIS — G8929 Other chronic pain: Secondary | ICD-10-CM

## 2018-05-06 DIAGNOSIS — R531 Weakness: Secondary | ICD-10-CM

## 2018-05-06 LAB — CBC WITH DIFFERENTIAL (CANCER CENTER ONLY)
Abs Immature Granulocytes: 0.12 10*3/uL — ABNORMAL HIGH (ref 0.00–0.07)
BASOS ABS: 0 10*3/uL (ref 0.0–0.1)
Basophils Relative: 0 %
Eosinophils Absolute: 0.1 10*3/uL (ref 0.0–0.5)
Eosinophils Relative: 1 %
HCT: 33.9 % — ABNORMAL LOW (ref 39.0–52.0)
Hemoglobin: 11.4 g/dL — ABNORMAL LOW (ref 13.0–17.0)
Immature Granulocytes: 1 %
Lymphocytes Relative: 9 %
Lymphs Abs: 0.9 10*3/uL (ref 0.7–4.0)
MCH: 30.6 pg (ref 26.0–34.0)
MCHC: 33.6 g/dL (ref 30.0–36.0)
MCV: 91.1 fL (ref 80.0–100.0)
Monocytes Absolute: 0.7 10*3/uL (ref 0.1–1.0)
Monocytes Relative: 7 %
Neutro Abs: 8.4 10*3/uL — ABNORMAL HIGH (ref 1.7–7.7)
Neutrophils Relative %: 82 %
Platelet Count: 116 10*3/uL — ABNORMAL LOW (ref 150–400)
RBC: 3.72 MIL/uL — AB (ref 4.22–5.81)
RDW: 17.2 % — ABNORMAL HIGH (ref 11.5–15.5)
WBC: 10.1 10*3/uL (ref 4.0–10.5)
nRBC: 0.2 % (ref 0.0–0.2)

## 2018-05-06 LAB — CMP (CANCER CENTER ONLY)
ALT: 28 U/L (ref 0–44)
AST: 69 U/L — ABNORMAL HIGH (ref 15–41)
Albumin: 2.2 g/dL — ABNORMAL LOW (ref 3.5–5.0)
Alkaline Phosphatase: 465 U/L — ABNORMAL HIGH (ref 38–126)
Anion gap: 15 (ref 5–15)
BUN: 9 mg/dL (ref 8–23)
CHLORIDE: 101 mmol/L (ref 98–111)
CO2: 26 mmol/L (ref 22–32)
Calcium: 7.8 mg/dL — ABNORMAL LOW (ref 8.9–10.3)
Creatinine: 0.76 mg/dL (ref 0.61–1.24)
GFR, Est AFR Am: >60 mL/min (ref >60)
GFR, Estimated: >60 mL/min (ref >60)
Glucose, Bld: 96 mg/dL (ref 70–99)
Potassium: 3.2 mmol/L — ABNORMAL LOW (ref 3.5–5.1)
Sodium: 142 mmol/L (ref 135–145)
Total Bilirubin: 1.6 mg/dL — ABNORMAL HIGH (ref 0.3–1.2)
Total Protein: 6.2 g/dL — ABNORMAL LOW (ref 6.5–8.1)

## 2018-05-06 MED ORDER — SODIUM CHLORIDE 0.9% FLUSH
10.0000 mL | INTRAVENOUS | Status: DC | PRN
  Administered 2018-05-06: 10 mL via INTRAVENOUS
  Filled 2018-05-06: qty 10

## 2018-05-06 MED ORDER — HEPARIN SOD (PORK) LOCK FLUSH 100 UNIT/ML IV SOLN
500.0000 [IU] | Freq: Once | INTRAVENOUS | Status: AC
  Administered 2018-05-06: 500 [IU] via INTRAVENOUS
  Filled 2018-05-06: qty 5

## 2018-05-06 MED ORDER — FLUCONAZOLE 100 MG PO TABS: 100.0000 mg | ORAL_TABLET | Freq: Every day | ORAL | 0 refills | Status: DC

## 2018-05-06 MED ORDER — OXYCODONE-ACETAMINOPHEN 5-325 MG PO TABS: 1.0000 | ORAL_TABLET | ORAL | 0 refills | Status: DC | PRN

## 2018-05-06 NOTE — Telephone Encounter (Signed)
Printed avs and calender of upcoming appointment per 2/14 los

## 2018-05-06 NOTE — Progress Notes (Addendum)
  [place] OFFICE PROGRESS NOTE   Diagnosis: Metastatic adenocarcinoma involving the liver  INTERVAL HISTORY:   Thomas Riley returns as scheduled.  He completed cycle 1 FOLFOX 04/28/2018.  He had no nausea or vomiting following the chemotherapy.  He has mild nausea at present.  He has noted some "bumps" at the roof of the mouth.  No mouth discomfort.  No significant diarrhea or constipation.  Cold sensitivity lasted about 1 week.  He continues to have intermittent abdominal pain.  He takes Percocet as needed.  He notes significant leg swelling.  Objective:  Vital signs in last 24 hours:  Blood pressure 112/73, pulse (!) 119, temperature 98.4 F (36.9 C), temperature source Oral, resp. rate 19, height 5\' 11"  (1.803 m), weight 188 lb 12.8 oz (85.6 kg), SpO2 98 %.    HEENT: Erythema at the right posterior palate and bilateral buccal regions.  Oral candidiasis as well. Resp: Lungs clear bilaterally. Cardio: Regular rate and rhythm. GI: Liver significantly enlarged extending to the left abdomen, associated tenderness. Vascular: Pitting edema throughout the lower legs.  Skin: Palms dry appearing. Right upper extremity PICC without erythema.   Lab Results:  Lab Results  Component Value Date   WBC 10.1 05/06/2018   HGB 11.4 (L) 05/06/2018   HCT 33.9 (L) 05/06/2018   MCV 91.1 05/06/2018   PLT 116 (L) 05/06/2018   NEUTROABS 8.4 (H) 05/06/2018    Imaging:  No results found.  Medications: I have reviewed the patient's current medications.  Assessment/Plan: 1.Right lower quadrant massextensive liver metastases  CTs 04/22/2018-right lower quadrant mass associated with the ileum and cecum, hepatomegaly with extensive liver metastases, multiple lung nodules consistent with metastases  Colonoscopy 04/24/2018- multiple benign-appearing polyps removed, no colon mass  04/24/2018 chromogranin A 350  04/24/2018 CEA 632  Biopsy liver lesion 04/25/2018- adenocarcinoma with  extensive necrosis  Cycle 1 FOLFOX 04/28/2018  2.Lower extremity edema secondary to massive hepatomegaly and hypoalbuminemia  3.Chronic back pain  4.  Proximal leg weakness- deconditioning?,  Spinal stenosis?  5.  Mucositis, candidiasis 05/06/2018.  Disposition: Thomas Riley appears stable.  He completed cycle 1 FOLFOX on 04/28/2018.  Overall he tolerated well.  He has mucositis and candidiasis on exam today.  5-fluorouracil will be dose reduced with cycle 2.  He will complete a 4-day course of Diflucan.  For the leg edema he will elevate and try compression stockings.  We reviewed the CBC from today.  He has mild thrombocytopenia.  He understands to call with any bleeding.  He is scheduled for Port-A-Cath placement and a PET scan 05/10/2018.  He will return for lab, follow-up and cycle 2 FOLFOX on 05/12/2018.  He will contact the office in the interim as outlined above or with any other problems.  Patient seen with Dr. Benay Spice.    [name] ANP/GNP-BC   05/06/2018  9:41 AM This was a shared visit with [name].  Thomas Riley was interviewed and examined.  He appears to have mucositis secondary to 5-fluorouracil.  The 5-fluorouracil will be dose reduced with cycle 2.  He will return for an office visit prior to cycle 2.  Julieanne Manson, MD

## 2018-05-08 ENCOUNTER — Other Ambulatory Visit: Payer: Self-pay | Admitting: Radiology

## 2018-05-10 ENCOUNTER — Ambulatory Visit (HOSPITAL_COMMUNITY)
Admission: RE | Admit: 2018-05-10 | Discharge: 2018-05-10 | Disposition: A | Discharge status: Home / Self Care | Payer: 59 | Source: Ambulatory Visit | Attending: Oncology | Admitting: Oncology

## 2018-05-10 ENCOUNTER — Ambulatory Visit (HOSPITAL_COMMUNITY)
Admission: RE | Admit: 2018-05-10 | Discharge: 2018-05-10 | Disposition: A | Discharge status: Home / Self Care | Payer: 59 | Source: Ambulatory Visit | Attending: Nurse Practitioner | Admitting: Nurse Practitioner

## 2018-05-10 ENCOUNTER — Encounter (HOSPITAL_COMMUNITY): Payer: Self-pay

## 2018-05-10 ENCOUNTER — Other Ambulatory Visit: Payer: Self-pay | Admitting: Nurse Practitioner

## 2018-05-10 DIAGNOSIS — C787 Secondary malignant neoplasm of liver and intrahepatic bile duct: Principal | ICD-10-CM

## 2018-05-10 DIAGNOSIS — Z79899 Other long term (current) drug therapy: Secondary | ICD-10-CM | POA: Diagnosis not present

## 2018-05-10 DIAGNOSIS — K219 Gastro-esophageal reflux disease without esophagitis: Secondary | ICD-10-CM | POA: Diagnosis not present

## 2018-05-10 DIAGNOSIS — C772 Secondary and unspecified malignant neoplasm of intra-abdominal lymph nodes: Secondary | ICD-10-CM | POA: Diagnosis not present

## 2018-05-10 DIAGNOSIS — C189 Malignant neoplasm of colon, unspecified: Secondary | ICD-10-CM

## 2018-05-10 DIAGNOSIS — Z5111 Encounter for antineoplastic chemotherapy: Secondary | ICD-10-CM | POA: Diagnosis not present

## 2018-05-10 DIAGNOSIS — F17210 Nicotine dependence, cigarettes, uncomplicated: Secondary | ICD-10-CM | POA: Diagnosis not present

## 2018-05-10 DIAGNOSIS — C801 Malignant (primary) neoplasm, unspecified: Secondary | ICD-10-CM | POA: Diagnosis not present

## 2018-05-10 HISTORY — PX: IR IMAGING GUIDED PORT INSERTION: IMG5740

## 2018-05-10 LAB — BASIC METABOLIC PANEL
Anion gap: 13 (ref 5–15)
BUN: 8 mg/dL (ref 8–23)
CHLORIDE: 102 mmol/L (ref 98–111)
CO2: 27 mmol/L (ref 22–32)
Calcium: 7.6 mg/dL — ABNORMAL LOW (ref 8.9–10.3)
Creatinine, Ser: 0.81 mg/dL (ref 0.61–1.24)
GFR calc Af Amer: >60 mL/min (ref >60)
GFR calc non Af Amer: >60 mL/min (ref >60)
Glucose, Bld: 95 mg/dL (ref 70–99)
Potassium: 3.3 mmol/L — ABNORMAL LOW (ref 3.5–5.1)
Sodium: 142 mmol/L (ref 135–145)

## 2018-05-10 LAB — PROTIME-INR
INR: 1.07
Prothrombin Time: 13.9 seconds (ref 11.4–15.2)

## 2018-05-10 LAB — CBC WITH DIFFERENTIAL/PLATELET
Abs Immature Granulocytes: 0.13 10*3/uL — ABNORMAL HIGH (ref 0.00–0.07)
Basophils Absolute: 0 10*3/uL (ref 0.0–0.1)
Basophils Relative: 0 %
Eosinophils Absolute: 0.1 10*3/uL (ref 0.0–0.5)
Eosinophils Relative: 0 %
HCT: 37.6 % — ABNORMAL LOW (ref 39.0–52.0)
HEMOGLOBIN: 12.1 g/dL — AB (ref 13.0–17.0)
Immature Granulocytes: 1 %
LYMPHS PCT: 15 %
Lymphs Abs: 1.7 10*3/uL (ref 0.7–4.0)
MCH: 31 pg (ref 26.0–34.0)
MCHC: 32.2 g/dL (ref 30.0–36.0)
MCV: 96.4 fL (ref 80.0–100.0)
Monocytes Absolute: 1.1 10*3/uL — ABNORMAL HIGH (ref 0.1–1.0)
Monocytes Relative: 9 %
Neutro Abs: 8.3 10*3/uL — ABNORMAL HIGH (ref 1.7–7.7)
Neutrophils Relative %: 75 %
Platelets: 241 10*3/uL (ref 150–400)
RBC: 3.9 MIL/uL — ABNORMAL LOW (ref 4.22–5.81)
RDW: 18.5 % — ABNORMAL HIGH (ref 11.5–15.5)
WBC: 11.3 10*3/uL — ABNORMAL HIGH (ref 4.0–10.5)
nRBC: 0 % (ref 0.0–0.2)

## 2018-05-10 LAB — GLUCOSE, CAPILLARY: Glucose-Capillary: 63 mg/dL — ABNORMAL LOW (ref 70–99)

## 2018-05-10 MED ORDER — HYDROCODONE-ACETAMINOPHEN 5-325 MG PO TABS: 1.0000 | ORAL_TABLET | ORAL | Status: DC | PRN

## 2018-05-10 MED ORDER — MIDAZOLAM HCL 2 MG/2ML IJ SOLN
INTRAMUSCULAR | Status: AC | PRN
  Administered 2018-05-10 (×2): 1 mg via INTRAVENOUS

## 2018-05-10 MED ORDER — HEPARIN SOD (PORK) LOCK FLUSH 100 UNIT/ML IV SOLN
INTRAVENOUS | Status: AC
  Filled 2018-05-10: qty 5

## 2018-05-10 MED ORDER — CEFAZOLIN SODIUM-DEXTROSE 2-4 GM/100ML-% IV SOLN
2.0000 g | INTRAVENOUS | Status: AC
  Administered 2018-05-10: 2 g via INTRAVENOUS

## 2018-05-10 MED ORDER — FLUDEOXYGLUCOSE F - 18 (FDG) INJECTION
9.2000 | Freq: Once | INTRAVENOUS | Status: AC
  Administered 2018-05-10: 9.2 via INTRAVENOUS

## 2018-05-10 MED ORDER — HEPARIN SOD (PORK) LOCK FLUSH 100 UNIT/ML IV SOLN
INTRAVENOUS | Status: AC | PRN
  Administered 2018-05-10: 500 [IU] via INTRAVENOUS

## 2018-05-10 MED ORDER — LIDOCAINE HCL (PF) 1 % IJ SOLN
INTRAMUSCULAR | Status: AC | PRN
  Administered 2018-05-10 (×2): 10 mL

## 2018-05-10 MED ORDER — MIDAZOLAM HCL 2 MG/2ML IJ SOLN
INTRAMUSCULAR | Status: AC
  Filled 2018-05-10: qty 2

## 2018-05-10 MED ORDER — SODIUM CHLORIDE 0.9 % IV SOLN
INTRAVENOUS | Status: DC
  Administered 2018-05-10: 11:00:00 via INTRAVENOUS

## 2018-05-10 MED ORDER — FENTANYL CITRATE (PF) 100 MCG/2ML IJ SOLN
INTRAMUSCULAR | Status: AC | PRN
  Administered 2018-05-10 (×2): 50 ug via INTRAVENOUS

## 2018-05-10 MED ORDER — LIDOCAINE-EPINEPHRINE (PF) 2 %-1:200000 IJ SOLN
INTRAMUSCULAR | Status: AC
  Filled 2018-05-10: qty 20

## 2018-05-10 MED ORDER — CEFAZOLIN SODIUM-DEXTROSE 2-4 GM/100ML-% IV SOLN
INTRAVENOUS | Status: AC
  Administered 2018-05-10: 2 g via INTRAVENOUS
  Filled 2018-05-10: qty 100

## 2018-05-10 MED ORDER — FENTANYL CITRATE (PF) 100 MCG/2ML IJ SOLN
INTRAMUSCULAR | Status: AC
  Filled 2018-05-10: qty 2

## 2018-05-10 NOTE — Discharge Instructions (Signed)
Moderate Conscious Sedation, Adult, Care After  These instructions provide you with information about caring for yourself after your procedure. Your health care provider may also give you more specific instructions. Your treatment has been planned according to current medical practices, but problems sometimes occur. Call your health care provider if you have any problems or questions after your procedure.  What can I expect after the procedure?  After your procedure, it is common:  · To feel sleepy for several hours.  · To feel clumsy and have poor balance for several hours.  · To have poor judgment for several hours.  · To vomit if you eat too soon.  Follow these instructions at home:  For at least 24 hours after the procedure:    · Do not:  ? Participate in activities where you could fall or become injured.  ? Drive.  ? Use heavy machinery.  ? Drink alcohol.  ? Take sleeping pills or medicines that cause drowsiness.  ? Make important decisions or sign legal documents.  ? Take care of children on your own.  · Rest.  Eating and drinking  · Follow the diet recommended by your health care provider.  · If you vomit:  ? Drink water, juice, or soup when you can drink without vomiting.  ? Make sure you have little or no nausea before eating solid foods.  General instructions  · Have a responsible adult stay with you until you are awake and alert.  · Take over-the-counter and prescription medicines only as told by your health care provider.  · If you smoke, do not smoke without supervision.  · Keep all follow-up visits as told by your health care provider. This is important.  Contact a health care provider if:  · You keep feeling nauseous or you keep vomiting.  · You feel light-headed.  · You develop a rash.  · You have a fever.  Get help right away if:  · You have trouble breathing.  This information is not intended to replace advice given to you by your health care provider. Make sure you discuss any questions you have  with your health care provider.  Document Released: 12/28/2012 Document Revised: 08/12/2015 Document Reviewed: 06/29/2015  Elsevier Interactive Patient Education © 2019 Elsevier Inc.  Implanted Port Insertion, Care After  This sheet gives you information about how to care for yourself after your procedure. Your health care provider may also give you more specific instructions. If you have problems or questions, contact your health care provider.  What can I expect after the procedure?  After the procedure, it is common to have:  · Discomfort at the port insertion site.  · Bruising on the skin over the port. This should improve over 3-4 days.  Follow these instructions at home:  Port care  · After your port is placed, you will get a manufacturer's information card. The card has information about your port. Keep this card with you at all times.  · Take care of the port as told by your health care provider. Ask your health care provider if you or a family member can get training for taking care of the port at home. A home health care nurse may also take care of the port.  · Make sure to remember what type of port you have.  Incision care         · Follow instructions from your health care provider about how to take care of your port insertion   site. Make sure you:  ? Wash your hands with soap and water before and after you change your bandage (dressing). If soap and water are not available, use hand sanitizer.  ? Change your dressing as told by your health care provider.  ? Leave stitches (sutures), skin glue, or adhesive strips in place. These skin closures may need to stay in place for 2 weeks or longer. If adhesive strip edges start to loosen and curl up, you may trim the loose edges. Do not remove adhesive strips completely unless your health care provider tells you to do that.  · Check your port insertion site every day for signs of infection. Check for:  ? Redness, swelling, or pain.  ? Fluid or  blood.  ? Warmth.  ? Pus or a bad smell.  Activity  · Return to your normal activities as told by your health care provider. Ask your health care provider what activities are safe for you.  · Do not lift anything that is heavier than 10 lb (4.5 kg), or the limit that you are told, until your health care provider says that it is safe.  General instructions  · Take over-the-counter and prescription medicines only as told by your health care provider.  · Do not take baths, swim, or use a hot tub until your health care provider approves. Ask your health care provider if you may take showers. You may only be allowed to take sponge baths.  · Do not drive for 24 hours if you were given a sedative during your procedure.  · Wear a medical alert bracelet in case of an emergency. This will tell any health care providers that you have a port.  · Keep all follow-up visits as told by your health care provider. This is important.  Contact a health care provider if:  · You cannot flush your port with saline as directed, or you cannot draw blood from the port.  · You have a fever or chills.  · You have redness, swelling, or pain around your port insertion site.  · You have fluid or blood coming from your port insertion site.  · Your port insertion site feels warm to the touch.  · You have pus or a bad smell coming from the port insertion site.  Get help right away if:  · You have chest pain or shortness of breath.  · You have bleeding from your port that you cannot control.  Summary  · Take care of the port as told by your health care provider. Keep the manufacturer's information card with you at all times.  · Change your dressing as told by your health care provider.  · Contact a health care provider if you have a fever or chills or if you have redness, swelling, or pain around your port insertion site.  · Keep all follow-up visits as told by your health care provider.  This information is not intended to replace advice given to  you by your health care provider. Make sure you discuss any questions you have with your health care provider.  Document Released: 12/28/2012 Document Revised: 10/05/2017 Document Reviewed: 10/05/2017  Elsevier Interactive Patient Education © 2019 Elsevier Inc.

## 2018-05-10 NOTE — Sedation Documentation (Signed)
PICC removed by Tora Duck, RT. PICC measured 41cm.

## 2018-05-10 NOTE — Procedures (Signed)
  Procedure: R IJ Port    EBL:   minimal Complications:  none immediate  See full dictation in [facility] P[facility].  D. Clavin Ruhlman MD Main # [phone] Pager  [phone]    

## 2018-05-10 NOTE — H&P (Signed)
Chief Complaint: Patient was seen in consultation today for adenocarcinoma  Referring Physician(s): Parker,Caleb M  Supervising Physician: Arne Cleveland  Patient Status: [facility] - Out-pt  History of Present Illness: Thomas Riley is a 63 y.o. male with past medical history of GERD and metastatic adenocarcinoma involving the liver who has been receiving chemotherapy via a right arm PICC.  He presents to radiology today for Port-A-Cath placement at the request of Dr. Benay Spice. He presents today in his usual state of health. He was recently treated for mucositis and oral thrush which has resolved.  He is NPO this AM. He does not take blood thinners.   Past Medical History:  Diagnosis Date  . GERD (gastroesophageal reflux disease)     Past Surgical History:  Procedure Laterality Date  . COLONOSCOPY WITH PROPOFOL N/A 04/24/2018   Procedure: COLONOSCOPY WITH PROPOFOL;  Surgeon: Jerene Bears, MD;  Location: [place];  Service: Gastroenterology;  Laterality: N/A;  . POLYPECTOMY  04/24/2018   Procedure: POLYPECTOMY;  Surgeon: Jerene Bears, MD;  Location: [facility] ENDOSCOPY;  Service: Gastroenterology;;    Allergies: Patient has no known allergies.  Medications: Prior to Admission medications   Medication Sig Start Date End Date Taking? Authorizing Provider  fluconazole (DIFLUCAN) 100 MG tablet Take 1 tablet (100 mg total) by mouth daily. 05/06/18  Yes Owens Shark, NP  Multiple Vitamins-Minerals (PRESERVISION AREDS) TABS Take 1 tablet by mouth.   Yes [provider]  omeprazole (PRILOSEC) 20 MG capsule Take 20 mg by mouth daily.  04/13/18  Yes [provider]  oxyCODONE-acetaminophen (PERCOCET/ROXICET) 5-325 MG tablet Take 1-2 tablets by mouth every 4 (four) hours as needed for severe pain. 05/06/18  Yes Owens Shark, NP  prochlorperazine (COMPAZINE) 10 MG tablet Take 1 tablet (10 mg total) by mouth every 6 (six) hours as needed for nausea or vomiting. 04/27/18  Yes  Owens Shark, NP     Family History  Problem Relation Age of Onset  . COPD Mother   . Heart disease Father   . COPD Brother   . Parkinson's disease Brother   . Cancer Neg Hx     Social History   Socioeconomic History  . Marital status: Married    Spouse name: Not on file  . Number of children: Not on file  . Years of education: Not on file  . Highest education level: Not on file  Occupational History  . Not on file  Social Needs  . Financial resource strain: Not on file  . Food insecurity:    Worry: Not on file    Inability: Not on file  . Transportation needs:    Medical: Not on file    Non-medical: Not on file  Tobacco Use  . Smoking status: Current Some Day Smoker    Packs/day: 0.30    Types: Cigarettes  . Smokeless tobacco: Never Used  Substance and Sexual Activity  . Alcohol use: Yes    Alcohol/week: 12.0 standard drinks    Types: 12 Cans of beer per week  . Drug use: Never  . Sexual activity: Not on file  Lifestyle  . Physical activity:    Days per week: Not on file    Minutes per session: Not on file  . Stress: Not on file  Relationships  . Social connections:    Talks on phone: Not on file    Gets together: Not on file    Attends religious service: Not on file  Active member of club or organization: Not on file    Attends meetings of clubs or organizations: Not on file    Relationship status: Not on file  Other Topics Concern  . Not on file  Social History Narrative  . Not on file     Review of Systems: A 12 point ROS discussed and pertinent positives are indicated in the HPI above.  All other systems are negative.  Review of Systems  Constitutional: Negative for fatigue and fever.  Respiratory: Negative for cough and shortness of breath.   Cardiovascular: Negative for chest pain.  Gastrointestinal: Negative for abdominal pain.  Musculoskeletal: Negative for back pain.  Psychiatric/Behavioral: Negative for behavioral problems and  confusion.    Vital Signs: BP (!) 144/88 (BP Location: Right Arm)   Pulse 80   Temp 97.9 F (36.6 C) (Oral)   Resp 18   Ht _0  (1.803 m)   Wt 188 lb 12.8 oz (85.6 kg)   SpO2 98%   BMI 26.33 kg/m   Physical Exam Vitals signs and nursing note reviewed.  Constitutional:      Appearance: Normal appearance.  HENT:     Mouth/Throat:     Mouth: Mucous membranes are moist.     Pharynx: No oropharyngeal exudate or posterior oropharyngeal erythema.  Cardiovascular:     Rate and Rhythm: Normal rate and regular rhythm.  Pulmonary:     Effort: Pulmonary effort is normal. No respiratory distress.     Breath sounds: Normal breath sounds.  Abdominal:     General: Abdomen is flat.     Palpations: Abdomen is soft.  Musculoskeletal:     Comments: R arm PICC in place.  Skin:    General: Skin is warm and dry.  Neurological:     General: No focal deficit present.     Mental Status: He is alert and oriented to person, place, and time.  Psychiatric:        Mood and Affect: Mood normal.        Behavior: Behavior normal.        Thought Content: Thought content normal.        Judgment: Judgment normal.      MD Evaluation Airway: WNL Heart: WNL Abdomen: WNL Chest/ Lungs: WNL ASA  Classification: 3 Mallampati/Airway Score: One   Imaging: Dg Chest 2 View  Result Date: 04/22/2018 CLINICAL DATA:  Feet and ankle swelling. EXAM: CHEST - 2 VIEW COMPARISON:  No prior. FINDINGS: Mediastinum hilar structures normal. Heart size normal. Mild right upper lobe infiltrate. Mild bibasilar atelectasis. Follow-up chest x-ray to demonstrate clearing suggested. No pleural effusion or pneumothorax. IMPRESSION: Mild right upper lobe infiltrate. Mild bibasilar atelectasis. Follow-up chest x-rays to demonstrate clearing suggested. Followup PA and lateral chest X-ray is recommended in 3-4 weeks following trial of antibiotic therapy to ensure resolution and exclude underlying malignancy. Electronically  Signed   By: Marcello Moores  Register   On: 04/22/2018 16:30   Ct Angio Chest Pe W And/or Wo Contrast  Result Date: 04/22/2018 CLINICAL DATA:  Dyspnea ongoing for 2-3 weeks. Bilateral lower extremity swelling onset 5 days ago. EXAM: CT ANGIOGRAPHY CHEST WITH CONTRAST TECHNIQUE: Multidetector CT imaging of the chest was performed using the standard protocol during bolus administration of intravenous contrast. Multiplanar CT image reconstructions and MIPs were obtained to evaluate the vascular anatomy. CONTRAST:  55 cc ISOVUE-370 IOPAMIDOL (ISOVUE-370) INJECTION 76% COMPARISON:  CXR 04/22/2018 FINDINGS: Cardiovascular: Conventional branch pattern of the great vessels with atherosclerosis of the left  subclavian artery. Nonaneurysmal minimally atherosclerotic thoracic aorta. No dissection. Satisfactory opacification of the pulmonary arteries to the segmental level without acute pulmonary embolus. Heart size is top normal. No pericardial effusion or thickening. No significant calcific coronary arteriosclerosis. Mediastinum/Nodes: No enlarged mediastinal, hilar, or axillary lymph nodes. Thyroid gland, trachea, and esophagus demonstrate no significant findings. Lungs/Pleura: Scattered noncalcified pulmonary nodules are identified bilaterally, measuring up to 4.7 mm. Two of these are seen in the right upper lobe anterior segment (4.6 mm each) series 6/71, one in the right middle lobe (4.7 mm in average), image 68, one in the left upper lobe (4.6 mm) image 72 and a 3 mm nodule in the right lower lobe, image 86. Dependent bibasilar atelectasis is noted. No pneumothorax or pulmonary consolidations. Trachea and mainstem bronchi appear patent. Upper Abdomen: The liver is enlarged and in homogeneous in appearance. Subtle underlying hypodense masses are not excluded but suboptimally characterized on this exam. A calcified granuloma is seen in the left hepatic lobe. The included left adrenal gland is unremarkable. The right adrenal  gland is excluded. Musculoskeletal: No chest wall mass. No aggressive osseous lesion of the bony thorax. No acute fracture. Review of the MIP images confirms the above findings. IMPRESSION: 1. No acute pulmonary embolus, aortic aneurysm or dissection. 2. Scattered bilateral noncalcified pulmonary nodules measuring up to 4.7 mm. This in conjunction with an enlarged heterogeneous, abnormal appearance of the liver with subtle hypodense masslike abnormalities within raise concern for possible metastatic disease. Dedicated CT of the abdomen and pelvis with IV contrast is recommended for further correlation. Aortic Atherosclerosis (ICD10-I70.0). Electronically Signed   By: Ashley Royalty M.D.   On: 04/22/2018 19:01   Ct Abdomen Pelvis W Contrast  Result Date: 04/22/2018 CLINICAL DATA:  Initial evaluation for acute abdominal distension. EXAM: CT ABDOMEN AND PELVIS WITH CONTRAST TECHNIQUE: Multidetector CT imaging of the abdomen and pelvis was performed using the standard protocol following bolus administration of intravenous contrast. CONTRAST:  152m OMNIPAQUE IOHEXOL 300 MG/ML  SOLN COMPARISON:  Prior CTA from earlier the same day. FINDINGS: Lower chest: Mild scattered subsegmental atelectatic changes present within the lung bases. 5 mm nodule within the lower right lung, better evaluated on recent chest CT. Small pericardial effusion. Hepatobiliary: Liver is diffusely enlarged with innumerable hypodense lesions scattered throughout both the right and left hepatic lobes, highly concerning for widespread hepatic metastases. Liver demonstrates a abnormal nodular contour or. For reference purposes, the dominant lesion position within the central aspect of the liver measures approximately 7.3 x 6.2 cm (series 3, image 40). Partially distended gallbladder without acute abnormality. Trace pericholecystic fluid noted, favored to be related intrinsic liver disease. No biliary dilatation. Pancreas: Pancreas within normal limits.  Spleen: Spleen within normal limits. Adrenals/Urinary Tract: 15 mm left adrenal nodule, indeterminate. Right adrenal gland grossly unremarkable, although poorly visualized due to mass effect by the adjacent liver. Kidneys fairly equal in size with symmetric enhancement. Subcentimeter hypodensity at the lower pole of the right kidney too small the characterize. No visible nephrolithiasis or hydronephrosis. No focal enhancing renal mass. Contrast material within the renal collecting systems bilaterally. No hydroureter. Partially distended bladder without acute abnormality. Secreted IV contrast material within the bladder lumen. Stomach/Bowel: Stomach displaced towards the left upper quadrant by the enlarged liver, but otherwise unremarkable without acute finding. No evidence for small bowel obstruction. There is an irregular exophytic enhancing mass within the right lower quadrant measuring approximately 4.3 x 3.1 x 4.7 cm (series 3, image 66). Mass is intimately associated  with an adjacent loop of ileum as well as the cecum, making it not entirely clear from which the lesion is arising from, although cecal origin is favored. Additionally, the appendix is not definitely visualized, raising the possibility for an appendiceal origin which could be considered as well. Surrounding inflammatory stranding and induration within the adjacent mesenteric fat. No other acute inflammatory changes about the bowels. Few scattered colonic diverticula noted. Vascular/Lymphatic: Normal intravascular enhancement seen throughout the intra-abdominal aorta. Mild to moderate aorto bi-iliac atherosclerotic disease. No aneurysm. Mesenteric vessels patent proximally. Enlarged 13 mm mesenteric lymph node within the right lower quadrant, concerning for nodal metastasis (series 3, image 66). No other pathologically enlarged lymph nodes identified within the abdomen and pelvis. Reproductive: Prostate normal. Other: Small volume mildly complex  free fluid within the pelvis. No free intraperitoneal air. Musculoskeletal: Mild diffuse anasarca within the external soft tissues, likely related to intrinsic liver disease. No acute osseous abnormality. No discrete lytic or blastic osseous lesions. IMPRESSION: 1. 4.3 x 3.1 x 4.7 cm exophytic enhancing mass within the right lower quadrant, intimately associated with an adjacent loop of ileum as well as the cecum, making it not entirely clear from which the lesion is arising from, although cecal origin is perhaps favored. Additionally, the appendix is not definitely visualized, raising the possibility for an appendiceal origin which could be considered as well. 2. Diffusely enlarged liver with innumerable hypodense lesions, concerning for widespread hepatic metastases. 3. Enlarged 13 mm mesenteric lymph node within the right lower quadrant, concerning for nodal metastasis. 4. 15 mm left adrenal nodule, indeterminate. 5. 5 mm right lower lobe pulmonary nodule, better evaluated on recent chest CT, but concerning for metastatic disease. 6. Small volume free fluid within the pelvis with mild diffuse anasarca. Electronically Signed   By: Jeannine Boga M.D.   On: 04/22/2018 20:47   US Biopsy (liver)  Result Date: 04/25/2018 INDICATION: No known primary, now with multiple lesions/masses throughout the liver worrisome for metastatic disease. Please from ultrasound-guided biopsy for tissue diagnostic purposes. EXAM: ULTRASOUND GUIDED LIVER LESION BIOPSY COMPARISON:  CT of the chest, abdomen and pelvis-04/22/2018 MEDICATIONS: None ANESTHESIA/SEDATION: Fentanyl 75 mcg IV; Versed 1 mg IV Total Moderate Sedation time:  14 Minutes. The patient's level of consciousness and vital signs were monitored continuously by radiology nursing throughout the procedure under my direct supervision. COMPLICATIONS: None immediate. PROCEDURE: Informed written consent was obtained from the patient after a discussion of the risks,  benefits and alternatives to treatment. The patient understands and consents the procedure. A timeout was performed prior to the initiation of the procedure. Ultrasound scanning was performed of the right upper abdominal quadrant demonstrates multiple mixed echogenic lesions/masses scattered throughout the entirety of the liver. A dominant centrally cystic infiltrative mass involving the caudal aspect the right lobe of the liver measuring approximately 6.1 x 6.1 cm (image 7), correlating with the infiltrative mass seen on preceding abdominal CT image 51, series 3, was targeted given lesion location and sonographic window. The procedure was planned. The right upper abdominal quadrant was prepped and draped in the usual sterile fashion. The overlying soft tissues were anesthetized with 1% lidocaine with epinephrine. A 17 gauge, 6.8 cm co-axial needle was advanced into a peripheral aspect of the lesion. At the initial coaxial needle placement, approximately 3 cc of blood tinged fluid was aspirated from the dominant cystic component of the infiltrative mass. All cystic fluid was capped and sent to the laboratory for analysis. Next, 6 core needle biopsies were obtained  of the ill-defined infiltrative mass with an 18 gauge core device under direct ultrasound guidance. Multiple ultrasound images were saved for procedural documentation purposes. The coaxial needle tract was embolized with a small amount of Gel-Foam slurry and superficial hemostasis was obtained with manual compression. Post procedural scanning was negative for definitive area of hemorrhage or additional complication. A dressing was placed. The patient tolerated the procedure well without immediate post procedural complication. IMPRESSION: Technically successful ultrasound guided core needle biopsy of infiltrative mass within the caudal aspect of the right lobe of the liver. Electronically Signed   By: Sandi Mariscal M.D.   On: 04/25/2018 11:03   Vas [place] Lower  Extremity Venous (dvt) (only [facility])  Result Date: 04/23/2018  Lower Venous Study Indications: Pitting edema.  Performing Technologist: June Leap RDMS, RVT  Examination Guidelines: A complete evaluation includes B-mode imaging, spectral Doppler, color Doppler, and power Doppler as needed of all accessible portions of each vessel. Bilateral testing is considered an integral part of a complete examination. Limited examinations for reoccurring indications may be performed as noted.  Right Venous Findings: +---------+---------------+---------+-----------+----------+-------+          CompressibilityPhasicitySpontaneityPropertiesSummary +---------+---------------+---------+-----------+----------+-------+ CFV      Full           Yes      Yes                          +---------+---------------+---------+-----------+----------+-------+ SFJ      Full                                                 +---------+---------------+---------+-----------+----------+-------+ FV Prox  Full                                                 +---------+---------------+---------+-----------+----------+-------+ FV Mid   Full                                                 +---------+---------------+---------+-----------+----------+-------+ FV DistalFull                                                 +---------+---------------+---------+-----------+----------+-------+ PFV      Full                                                 +---------+---------------+---------+-----------+----------+-------+ POP      Full           Yes      Yes                          +---------+---------------+---------+-----------+----------+-------+ PTV      Full                                                 +---------+---------------+---------+-----------+----------+-------+  PERO     Full                                                  +---------+---------------+---------+-----------+----------+-------+  Left Venous Findings: +---------+---------------+---------+-----------+----------+-------+          CompressibilityPhasicitySpontaneityPropertiesSummary +---------+---------------+---------+-----------+----------+-------+ CFV      Full           Yes      Yes                          +---------+---------------+---------+-----------+----------+-------+ SFJ      Full                                                 +---------+---------------+---------+-----------+----------+-------+ FV Prox  Full                                                 +---------+---------------+---------+-----------+----------+-------+ FV Mid   Full                                                 +---------+---------------+---------+-----------+----------+-------+ FV DistalFull                                                 +---------+---------------+---------+-----------+----------+-------+ PFV      Full                                                 +---------+---------------+---------+-----------+----------+-------+ POP      Full           Yes      Yes                          +---------+---------------+---------+-----------+----------+-------+ PTV      Full                                                 +---------+---------------+---------+-----------+----------+-------+ PERO     Full                                                 +---------+---------------+---------+-----------+----------+-------+    Summary: Right: There is no evidence of deep vein thrombosis in the lower extremity. No cystic structure found in the popliteal fossa. Left: There is no evidence of deep vein thrombosis in the lower extremity. No cystic structure found in the popliteal fossa.  *  See table(s) above for measurements and observations. Electronically signed by Curt Jews MD on 04/23/2018 at 3:32:00 PM.    Final    Ir Picc  Placement Right >5 Yrs Inc Img Guide  Result Date: 04/27/2018 INDICATION: Patient with history of metastatic adenocarcinoma involving the liver; central venous access requested for chemotherapy. EXAM: ULTRASOUND AND FLUOROSCOPIC GUIDED PICC LINE INSERTION MEDICATIONS: 1% lidocaine to skin and subcutaneous tissue ANESTHESIA/SEDATION: None FLUOROSCOPY TIME:  Fluoroscopy Time:  30 seconds (2 mGy). COMPLICATIONS: None immediate. TECHNIQUE: The procedure, risks, benefits, and alternatives were explained to the patient and informed written consent was obtained. A timeout was performed prior to the initiation of the procedure. The right upper extremity was prepped with chlorhexidine in a sterile fashion, and a sterile drape was applied covering the operative field. Maximum barrier sterile technique with sterile gowns and gloves were used for the procedure. A timeout was performed prior to the initiation of the procedure. Local anesthesia was provided with 1% lidocaine. Under direct ultrasound guidance, the right basilic vein was accessed with a micropuncture kit after the overlying soft tissues were anesthetized with 1% lidocaine. An ultrasound image was saved for documentation purposes. A guidewire was advanced to the level of the superior caval-atrial junction for measurement purposes and the PICC line was cut to length. A peel-away sheath was placed and a 41 cm, 5 [place], single lumen catheter was inserted to level of the superior caval-atrial junction. A post procedure spot fluoroscopic was obtained. The catheter easily aspirated and flushed and was sutured in place. Catheter was capped. A dressing was placed. The patient tolerated the procedure well without immediate post procedural complication. FINDINGS: After catheter placement, the tip lies within the superior caval- atrial junction; the catheter aspirates and flushes normally and is ready for immediate use. IMPRESSION: Successful ultrasound and fluoroscopic  guided placement of a right basilic vein approach 41 cm, 5 French, single lumen PICC with tip at the superior caval-atrial junction. The PICC line is ready for immediate use. Read by: Rowe Robert, PA-C Electronically Signed   By: Jacqulynn Cadet M.D.   On: 04/27/2018 16:56    Labs:  CBC: Recent Labs    04/23/18 0309 04/28/18 0756 05/06/18 0900 05/10/18 1034  WBC 10.8* 12.0* 10.1 11.3*  HGB 11.4* 12.1* 11.4* 12.1*  HCT 35.4* 36.2* 33.9* 37.6*  PLT 204 289 116* 241    COAGS: Recent Labs    04/25/18 0759 05/10/18 1034  INR 1.13 1.07    BMP: Recent Labs    04/23/18 0309 04/28/18 0756 05/06/18 0900 05/10/18 1034  NA 140 141 142 142  K 3.7 3.8 3.2* 3.3*  CL 104 103 101 102  CO2 _0 GLUCOSE 112* 111* 96 95  BUN _1 CALCIUM 7.7* 8.4* 7.8* 7.6*  CREATININE 0.88 1.02 0.76 0.81  GFRNONAA >60 >60 >60 >60  GFRAA >60 >60 >60 >60    LIVER FUNCTION TESTS: Recent Labs    04/22/18 1905 04/23/18 0309 04/28/18 0756 05/06/18 0900  BILITOT 1.3* 1.3* 1.9* 1.6*  AST 93* 86* 140* 69*  ALT 53* 42 58* 28  ALKPHOS 343* 298* 403* 465*  PROT 7.2 5.7* 6.5 6.2*  ALBUMIN 2.9* 2.4* 2.5* 2.2*    TUMOR MARKERS: No results for input(s): AFPTM, CEA, CA199, CHROMGRNA in the last 8760 hours.  Assessment and Plan: Patient with past medical history of adenocarcinoma currently undergoing chemotherapy presents for Port-A-Cath placement at the request of Dr. Luberta Robertson, NP. Case  reviewed by Dr. Vernard Gambles who approves patient for procedure.  Patient presents today in their usual state of health. He has been NPO and is not currently on blood thinners.   Risks and benefits of image guided port-a-catheter placement was discussed with the patient including, but not limited to bleeding, infection, pneumothorax, or fibrin sheath development and need for additional procedures.  All of the patient's questions were answered, patient is agreeable to proceed. Consent signed  and in chart.  Thank you for this interesting consult.  I greatly enjoyed meeting Thomas Riley and look forward to participating in their care.  A copy of this report was sent to the requesting provider on this date.  Electronically Signed: Docia Barrier, PA 05/10/2018, 11:53 AM   I spent a total of  30 Minutes   in face to face in clinical consultation, greater than 50% of which was counseling/coordinating care for adenocarcinoma.

## 2018-05-12 ENCOUNTER — Encounter: Payer: Self-pay | Admitting: Nurse Practitioner

## 2018-05-12 ENCOUNTER — Telehealth: Payer: Self-pay | Admitting: Nurse Practitioner

## 2018-05-12 ENCOUNTER — Inpatient Hospital Stay: Payer: 59

## 2018-05-12 ENCOUNTER — Inpatient Hospital Stay (HOSPITAL_BASED_OUTPATIENT_CLINIC_OR_DEPARTMENT_OTHER): Payer: 59 | Admitting: Nurse Practitioner

## 2018-05-12 VITALS — BP 104/56 | HR 100 | Temp 98.0°F | Resp 18 | Ht 71.0 in | Wt 191.1 lb

## 2018-05-12 VITALS — BP 127/86

## 2018-05-12 DIAGNOSIS — C801 Malignant (primary) neoplasm, unspecified: Secondary | ICD-10-CM | POA: Diagnosis not present

## 2018-05-12 DIAGNOSIS — Z79899 Other long term (current) drug therapy: Secondary | ICD-10-CM

## 2018-05-12 DIAGNOSIS — C787 Secondary malignant neoplasm of liver and intrahepatic bile duct: Principal | ICD-10-CM

## 2018-05-12 DIAGNOSIS — R918 Other nonspecific abnormal finding of lung field: Secondary | ICD-10-CM | POA: Diagnosis not present

## 2018-05-12 DIAGNOSIS — T80212A Local infection due to central venous catheter, initial encounter: Secondary | ICD-10-CM | POA: Insufficient documentation

## 2018-05-12 DIAGNOSIS — E876 Hypokalemia: Secondary | ICD-10-CM

## 2018-05-12 DIAGNOSIS — Z95828 Presence of other vascular implants and grafts: Secondary | ICD-10-CM

## 2018-05-12 DIAGNOSIS — M549 Dorsalgia, unspecified: Secondary | ICD-10-CM

## 2018-05-12 DIAGNOSIS — R531 Weakness: Secondary | ICD-10-CM

## 2018-05-12 DIAGNOSIS — R600 Localized edema: Secondary | ICD-10-CM

## 2018-05-12 DIAGNOSIS — C189 Malignant neoplasm of colon, unspecified: Secondary | ICD-10-CM

## 2018-05-12 DIAGNOSIS — G8929 Other chronic pain: Secondary | ICD-10-CM

## 2018-05-12 DIAGNOSIS — C7800 Secondary malignant neoplasm of unspecified lung: Secondary | ICD-10-CM

## 2018-05-12 DIAGNOSIS — R1903 Right lower quadrant abdominal swelling, mass and lump: Secondary | ICD-10-CM

## 2018-05-12 LAB — CBC WITH DIFFERENTIAL (CANCER CENTER ONLY)
ABS IMMATURE GRANULOCYTES: 0.08 10*3/uL — AB (ref 0.00–0.07)
BASOS ABS: 0 10*3/uL (ref 0.0–0.1)
Basophils Relative: 0 %
Eosinophils Absolute: 0.1 10*3/uL (ref 0.0–0.5)
Eosinophils Relative: 1 %
HEMATOCRIT: 34.1 % — AB (ref 39.0–52.0)
Hemoglobin: 11 g/dL — ABNORMAL LOW (ref 13.0–17.0)
IMMATURE GRANULOCYTES: 1 %
LYMPHS PCT: 16 %
Lymphs Abs: 1.2 10*3/uL (ref 0.7–4.0)
MCH: 30.8 pg (ref 26.0–34.0)
MCHC: 32.3 g/dL (ref 30.0–36.0)
MCV: 95.5 fL (ref 80.0–100.0)
Monocytes Absolute: 1.4 10*3/uL — ABNORMAL HIGH (ref 0.1–1.0)
Monocytes Relative: 18 %
Neutro Abs: 5.1 10*3/uL (ref 1.7–7.7)
Neutrophils Relative %: 64 %
Platelet Count: 338 10*3/uL (ref 150–400)
RBC: 3.57 MIL/uL — ABNORMAL LOW (ref 4.22–5.81)
RDW: 18.1 % — ABNORMAL HIGH (ref 11.5–15.5)
WBC Count: 7.9 10*3/uL (ref 4.0–10.5)
nRBC: 0 % (ref 0.0–0.2)

## 2018-05-12 LAB — CMP (CANCER CENTER ONLY)
ALT: 15 U/L (ref 0–44)
AST: 43 U/L — ABNORMAL HIGH (ref 15–41)
Albumin: 2.3 g/dL — ABNORMAL LOW (ref 3.5–5.0)
Alkaline Phosphatase: 429 U/L — ABNORMAL HIGH (ref 38–126)
Anion gap: 16 — ABNORMAL HIGH (ref 5–15)
BUN: 7 mg/dL — ABNORMAL LOW (ref 8–23)
CALCIUM: 7.6 mg/dL — AB (ref 8.9–10.3)
CO2: 26 mmol/L (ref 22–32)
Chloride: 102 mmol/L (ref 98–111)
Creatinine: 0.75 mg/dL (ref 0.61–1.24)
GFR, Estimated: >60 mL/min (ref >60)
Glucose, Bld: 107 mg/dL — ABNORMAL HIGH (ref 70–99)
Potassium: 2.9 mmol/L — CL (ref 3.5–5.1)
Sodium: 144 mmol/L (ref 135–145)
Total Bilirubin: 1 mg/dL (ref 0.3–1.2)
Total Protein: 6.3 g/dL — ABNORMAL LOW (ref 6.5–8.1)

## 2018-05-12 MED ORDER — POTASSIUM CHLORIDE CRYS ER 20 MEQ PO TBCR: EXTENDED_RELEASE_TABLET | ORAL | 1 refills | Status: DC

## 2018-05-12 MED ORDER — SODIUM CHLORIDE 0.9 % IV SOLN
1800.0000 mg/m2 | INTRAVENOUS | Status: DC
  Administered 2018-05-12: 3700 mg via INTRAVENOUS
  Filled 2018-05-12: qty 74

## 2018-05-12 MED ORDER — DEXAMETHASONE SODIUM PHOSPHATE 10 MG/ML IJ SOLN
10.0000 mg | Freq: Once | INTRAMUSCULAR | Status: AC
  Administered 2018-05-12: 10 mg via INTRAVENOUS

## 2018-05-12 MED ORDER — ALTEPLASE 2 MG IJ SOLR
2.0000 mg | Freq: Once | INTRAMUSCULAR | Status: DC | PRN
  Filled 2018-05-12: qty 2

## 2018-05-12 MED ORDER — LEUCOVORIN CALCIUM INJECTION 350 MG
300.0000 mg/m2 | Freq: Once | INTRAVENOUS | Status: AC
  Administered 2018-05-12: 616 mg via INTRAVENOUS
  Filled 2018-05-12: qty 30.8

## 2018-05-12 MED ORDER — PALONOSETRON HCL INJECTION 0.25 MG/5ML
0.2500 mg | Freq: Once | INTRAVENOUS | Status: AC
  Administered 2018-05-12: 0.25 mg via INTRAVENOUS

## 2018-05-12 MED ORDER — DEXAMETHASONE SODIUM PHOSPHATE 10 MG/ML IJ SOLN
INTRAMUSCULAR | Status: AC
  Filled 2018-05-12: qty 1

## 2018-05-12 MED ORDER — PALONOSETRON HCL INJECTION 0.25 MG/5ML
INTRAVENOUS | Status: AC
  Filled 2018-05-12: qty 5

## 2018-05-12 MED ORDER — HEPARIN SOD (PORK) LOCK FLUSH 100 UNIT/ML IV SOLN
500.0000 [IU] | Freq: Once | INTRAVENOUS | Status: DC | PRN
  Filled 2018-05-12: qty 5

## 2018-05-12 MED ORDER — SODIUM CHLORIDE 0.9% FLUSH
10.0000 mL | INTRAVENOUS | Status: DC | PRN
  Administered 2018-05-12: 10 mL
  Filled 2018-05-12: qty 10

## 2018-05-12 MED ORDER — OXALIPLATIN CHEMO INJECTION 100 MG/20ML
85.0000 mg/m2 | Freq: Once | INTRAVENOUS | Status: AC
  Administered 2018-05-12: 175 mg via INTRAVENOUS
  Filled 2018-05-12: qty 35

## 2018-05-12 MED ORDER — SODIUM CHLORIDE 0.9% FLUSH
10.0000 mL | INTRAVENOUS | Status: DC | PRN
  Filled 2018-05-12: qty 10

## 2018-05-12 MED ORDER — DEXTROSE 5 % IV SOLN
Freq: Once | INTRAVENOUS | Status: AC
  Administered 2018-05-12: 12:00:00 via INTRAVENOUS
  Filled 2018-05-12: qty 250

## 2018-05-12 MED ORDER — OMEPRAZOLE 20 MG PO CPDR: 20.0000 mg | DELAYED_RELEASE_CAPSULE | Freq: Every day | ORAL | 0 refills | Status: DC

## 2018-05-12 NOTE — Telephone Encounter (Signed)
Scheduled appt per 02/20 los.  Printed avs.

## 2018-05-12 NOTE — Progress Notes (Signed)
[place] OFFICE PROGRESS NOTE   Diagnosis: Metastatic adenocarcinoma involving the liver  INTERVAL HISTORY:   Mr. Sevin returns as scheduled.  He completed cycle 1 FOLFOX 04/28/2018.  He denies nausea/vomiting.  No mouth sores.  He did have recent oral candidiasis and completed a course of Diflucan.  He denies diarrhea.  He had mild cold sensitivity lasting less than 1 week.  No numbness or tingling today.  He reports an overall good appetite.  Objective:  Vital signs in last 24 hours:  Blood pressure (!) 104/56, pulse 100, temperature 98 F (36.7 C), temperature source Oral, resp. rate 18, height 5\' 11"  (1.803 m), weight 191 lb 1.6 oz (86.7 kg), SpO2 99 %.    HEENT: No thrush or ulcers. Resp: Lungs are clear.   Cardio: Regular rate and rhythm. GI: Liver is significantly enlarged extending to the left abdomen. Vascular: Persistent pitting edema throughout both lower legs. Port-A-Cath without erythema.  Lab Results:  Lab Results  Component Value Date   WBC 7.9 05/12/2018   HGB 11.0 (L) 05/12/2018   HCT 34.1 (L) 05/12/2018   MCV 95.5 05/12/2018   PLT 338 05/12/2018   NEUTROABS 5.1 05/12/2018    Imaging:  Nm Pet Image Initial (pi) Skull Base To Thigh  Result Date: 05/10/2018 CLINICAL DATA:  Initial treatment strategy for metastatic cancer to liver. EXAM: NUCLEAR MEDICINE PET SKULL BASE TO THIGH TECHNIQUE: 9.2 mCi F-18 FDG was injected intravenously. Full-ring PET imaging was performed from the skull base to thigh after the radiotracer. CT data was obtained and used for attenuation correction and anatomic localization. Fasting blood glucose: 63 mg/dl COMPARISON:  CTs of the chest abdomen and pelvis of 04/22/2018. FINDINGS: Mediastinal blood pool activity: SUV max 1.8 NECK: No areas of abnormal hypermetabolism. Incidental CT findings: Bilateral carotid atherosclerosis. No cervical adenopathy. CHEST: No pulmonary parenchymal or thoracic nodal hypermetabolism.  Incidental CT findings: Right Port-A-Cath tip high right atrium. Gas within the anterior right chest wall is likely due to recent catheter placement. Aortic atherosclerosis. Pulmonary nodules which are below PET resolution. ABDOMEN/PELVIS: Relatively diffuse hypermetabolic liver lesions. A focus of hypermetabolism within the anterior portion of segment 4 measures a S.U.V. max of 12.9, including on approximately image 107/4. Hypermetabolism corresponding to the right lower quadrant nodule/mass. Example at 3.4 x 2.4 cm and a S.U.V. max of 11.3 on image 157/4. Compare 4.3 x 3.1 cm on 04/22/2018. An ileocolic mesenteric node measures 1.3 cm and a S.U.V. max of 4.6 on image 156/4. Incidental CT findings: Small volume cul-de-sac fluid, slightly increased. No bowel obstruction. Abdominal aortic atherosclerosis. Normal adrenal glands. SKELETON: No abnormal marrow activity. Incidental CT findings: none IMPRESSION: 1. Widespread bilateral hepatic metastasis, as before. 2. Hypermetabolic right lower quadrant mass, felt to be slightly decreased in size compared to 36/46/8032. 3. Hypermetabolic ileocolic mesenteric nodal metastasis. 4. No evidence of supradiaphragmatic hypermetabolic metastasis. Pulmonary nodules are below PET resolution. Electronically Signed   By: Abigail Miyamoto M.D.   On: 05/10/2018 18:37   Ir Imaging Guided Port Insertion  Result Date: 05/10/2018 CLINICAL DATA:  Colon carcinoma metastatic to liver. Needs durable venous access for planned chemotherapy regimen. EXAM: TUNNELED PORT CATHETER PLACEMENT WITH ULTRASOUND AND FLUOROSCOPIC GUIDANCE FLUOROSCOPY TIME:  0.2 minute; 64  uGym2 DAP ANESTHESIA/SEDATION: Intravenous Fentanyl and Versed were administered as conscious sedation during continuous monitoring of the patient's level of consciousness and physiological / cardiorespiratory status by the radiology RN, with a total moderate sedation time of 19 minutes. TECHNIQUE: The procedure,  risks, benefits, and  alternatives were explained to the patient. Questions regarding the procedure were encouraged and answered. The patient understands and consents to the procedure. As antibiotic prophylaxis, cefazolin 1 g was ordered pre-procedure and administered intravenously within one hour of incision. Patency of the right IJ vein was confirmed with ultrasound with image documentation. An appropriate skin site was determined. Skin site was marked. Region was prepped using maximum barrier technique including cap and mask, sterile gown, sterile gloves, large sterile sheet, and Chlorhexidine as cutaneous antisepsis. The region was infiltrated locally with 1% lidocaine. Under real-time ultrasound guidance, the right IJ vein was accessed with a 21 gauge micropuncture needle; the needle tip within the vein was confirmed with ultrasound image documentation. Needle was exchanged over a 018 guidewire for transitional dilator which allowed passage of the [facility] wire into the IVC. Over this, the transitional dilator was exchanged for a 5 [place] MPA catheter. A small incision was made on the right anterior chest wall and a subcutaneous pocket fashioned. The power-injectable port was positioned and its catheter tunneled to the right IJ dermatotomy site. The MPA catheter was exchanged over an Amplatz wire for a peel-away sheath, through which the port catheter, which had been trimmed to the appropriate length, was advanced and positioned under fluoroscopy with its tip at the cavoatrial junction. Spot chest radiograph confirms good catheter position and no pneumothorax. The pocket was closed with deep interrupted and subcuticular continuous 3-0 Monocryl sutures. The port was flushed per protocol. The incisions were covered with Dermabond then covered with a sterile dressing. The previously placed right arm PICC line was then removed. COMPLICATIONS: COMPLICATIONS None immediate IMPRESSION: Technically successful right IJ power-injectable port  catheter placement. Ready for routine use. Electronically Signed   By: Lucrezia Europe M.D.   On: 05/10/2018 15:35    Medications: I have reviewed the patient's current medications.  Assessment/Plan: 1.Right lower quadrant massextensive liver metastases  CTs 04/22/2018-right lower quadrant mass associated with the ileum and cecum, hepatomegaly with extensive liver metastases, multiple lung nodules consistent with metastases  Colonoscopy 04/24/2018- multiple benign-appearing polyps removed, no colon mass  04/24/2018 chromogranin A 350  04/24/2018 CEA 632  Biopsy liver lesion 04/25/2018- adenocarcinoma with extensive necrosis  Cycle 1 FOLFOX 04/28/2018  PET scan 05/10/2018- widespread bilateral hepatic metastasis.  Hypermetabolic right lower quadrant mass.  Hypermetabolic ileocolic mesenteric nodal metastasis.  Cycle 2 FOLFOX 05/12/2018 (5-FU dose reduced due to mucositis following cycle 1)  2.Lower extremity edema secondary to massive hepatomegaly and hypoalbuminemia  3.Chronic back pain  4.Proximal leg weakness- deconditioning?, Spinal stenosis?  5.  Mucositis, candidiasis 05/06/2018.  Resolved following a course of Diflucan.  Disposition: [name] appears stable.  He has completed 1 cycle of FOLFOX.  Plan to proceed with cycle 2 today as scheduled.  The 5-fluorouracil will be dose reduced with treatment today due to mucositis following cycle 1.  We reviewed the labs from today.  Counts are adequate for treatment.  The bilirubin has normalized.  He has hypokalemia.  A prescription for K-Dur was sent to his pharmacy.  He will return for lab, follow-up and cycle 3 FOLFOX in 2 weeks.  He will contact the office in the interim with any problems.  [name] ANP/GNP-BC   05/12/2018  11:49 AM

## 2018-05-12 NOTE — Patient Instructions (Signed)
[facility] Discharge Instructions for Patients Receiving Chemotherapy  Today you received the following chemotherapy agents: Oxaliplatin/Leucovorin/5FU  To help prevent nausea and vomiting after your treatment, we encourage you to take your nausea medication as directed.   If you develop nausea and vomiting that is not controlled by your nausea medication, call the clinic.   BELOW ARE SYMPTOMS THAT SHOULD BE REPORTED IMMEDIATELY:  *FEVER GREATER THAN 100.5 F  *CHILLS WITH OR WITHOUT FEVER  NAUSEA AND VOMITING THAT IS NOT CONTROLLED WITH YOUR NAUSEA MEDICATION  *UNUSUAL SHORTNESS OF BREATH  *UNUSUAL BRUISING OR BLEEDING  TENDERNESS IN MOUTH AND THROAT WITH OR WITHOUT PRESENCE OF ULCERS  *URINARY PROBLEMS  *BOWEL PROBLEMS  UNUSUAL RASH Items with * indicate a potential emergency and should be followed up as soon as possible.  Feel free to call the clinic should you have any questions or concerns. The clinic phone number is [phone].  Please show the CHEMO ALERT CARD at check-in to the Emergency Department and triage nurse.   

## 2018-05-13 ENCOUNTER — Telehealth: Payer: Self-pay | Admitting: *Deleted

## 2018-05-13 NOTE — Telephone Encounter (Signed)
Wife left VM asking if it is OK for patient to go to the dentist? Called back and left message for her asking when is the appointment and what do they plan on doing? Do his cleanings cause a lot of bleeding or tissue injury?

## 2018-05-14 ENCOUNTER — Inpatient Hospital Stay: Payer: 59

## 2018-05-14 VITALS — BP 150/82 | HR 92 | Temp 97.6°F | Resp 16

## 2018-05-14 DIAGNOSIS — C787 Secondary malignant neoplasm of liver and intrahepatic bile duct: Secondary | ICD-10-CM | POA: Diagnosis not present

## 2018-05-14 DIAGNOSIS — Z95828 Presence of other vascular implants and grafts: Secondary | ICD-10-CM

## 2018-05-14 MED ORDER — HEPARIN SOD (PORK) LOCK FLUSH 100 UNIT/ML IV SOLN
500.0000 [IU] | Freq: Once | INTRAVENOUS | Status: AC | PRN
  Administered 2018-05-14: 500 [IU]
  Filled 2018-05-14: qty 5

## 2018-05-16 ENCOUNTER — Other Ambulatory Visit: Payer: Self-pay | Admitting: Medical Oncology

## 2018-05-16 ENCOUNTER — Telehealth: Payer: Self-pay | Admitting: Medical Oncology

## 2018-05-16 DIAGNOSIS — C787 Secondary malignant neoplasm of liver and intrahepatic bile duct: Secondary | ICD-10-CM

## 2018-05-16 MED ORDER — OXYCODONE-ACETAMINOPHEN 5-325 MG PO TABS: 1.0000 | ORAL_TABLET | ORAL | 0 refills | Status: DC | PRN

## 2018-05-16 NOTE — Progress Notes (Signed)
cancelled refill percocet today and note to Lattie Haw to refill via escribe tomorrow.

## 2018-05-16 NOTE — Addendum Note (Signed)
Addended by: Ardeen Garland on: 05/16/2018 11:07 AM   Modules accepted: Orders

## 2018-05-16 NOTE — Telephone Encounter (Addendum)
Pain med refill-'I have had some rough nights and need a refill on my pain medication.  I take mostly 1 every 4 hours , but I take  2 tablets at night and wake up in pain and  I take  2 tablets in am." He said he has 14 tablets left. Message sent to [name] to refill tomorrow.

## 2018-05-17 ENCOUNTER — Other Ambulatory Visit: Payer: Self-pay | Admitting: Nurse Practitioner

## 2018-05-17 DIAGNOSIS — C787 Secondary malignant neoplasm of liver and intrahepatic bile duct: Secondary | ICD-10-CM

## 2018-05-17 MED ORDER — OXYCODONE-ACETAMINOPHEN 5-325 MG PO TABS: 1.0000 | ORAL_TABLET | ORAL | 0 refills | Status: DC | PRN

## 2018-05-19 ENCOUNTER — Telehealth: Payer: Self-pay | Admitting: *Deleted

## 2018-05-19 NOTE — Telephone Encounter (Signed)
Called to inquire if he can go to the dentist  On 06/01/18? Per Dr. Benay Spice: OK to see dentist for xrays and cleaning. No extractions at this time.

## 2018-05-22 ENCOUNTER — Other Ambulatory Visit: Payer: Self-pay | Admitting: Oncology

## 2018-05-25 ENCOUNTER — Other Ambulatory Visit: Payer: Self-pay

## 2018-05-25 ENCOUNTER — Inpatient Hospital Stay: Payer: 59 | Attending: Oncology

## 2018-05-25 ENCOUNTER — Inpatient Hospital Stay: Payer: 59 | Admitting: Nurse Practitioner

## 2018-05-25 ENCOUNTER — Inpatient Hospital Stay (HOSPITAL_BASED_OUTPATIENT_CLINIC_OR_DEPARTMENT_OTHER): Payer: 59 | Admitting: Oncology

## 2018-05-25 ENCOUNTER — Inpatient Hospital Stay: Payer: 59

## 2018-05-25 VITALS — BP 126/68 | HR 89 | Temp 98.6°F | Resp 18 | Ht 71.0 in | Wt 186.1 lb

## 2018-05-25 DIAGNOSIS — R531 Weakness: Secondary | ICD-10-CM

## 2018-05-25 DIAGNOSIS — Z95828 Presence of other vascular implants and grafts: Secondary | ICD-10-CM

## 2018-05-25 DIAGNOSIS — T451X5S Adverse effect of antineoplastic and immunosuppressive drugs, sequela: Secondary | ICD-10-CM | POA: Insufficient documentation

## 2018-05-25 DIAGNOSIS — G8929 Other chronic pain: Secondary | ICD-10-CM | POA: Diagnosis not present

## 2018-05-25 DIAGNOSIS — C801 Malignant (primary) neoplasm, unspecified: Secondary | ICD-10-CM | POA: Diagnosis not present

## 2018-05-25 DIAGNOSIS — R5383 Other fatigue: Secondary | ICD-10-CM | POA: Insufficient documentation

## 2018-05-25 DIAGNOSIS — C189 Malignant neoplasm of colon, unspecified: Secondary | ICD-10-CM

## 2018-05-25 DIAGNOSIS — C7800 Secondary malignant neoplasm of unspecified lung: Secondary | ICD-10-CM | POA: Insufficient documentation

## 2018-05-25 DIAGNOSIS — R600 Localized edema: Secondary | ICD-10-CM | POA: Insufficient documentation

## 2018-05-25 DIAGNOSIS — Z79899 Other long term (current) drug therapy: Secondary | ICD-10-CM | POA: Diagnosis not present

## 2018-05-25 DIAGNOSIS — D6959 Other secondary thrombocytopenia: Secondary | ICD-10-CM

## 2018-05-25 DIAGNOSIS — M549 Dorsalgia, unspecified: Secondary | ICD-10-CM

## 2018-05-25 DIAGNOSIS — C787 Secondary malignant neoplasm of liver and intrahepatic bile duct: Secondary | ICD-10-CM

## 2018-05-25 DIAGNOSIS — Z5111 Encounter for antineoplastic chemotherapy: Secondary | ICD-10-CM | POA: Diagnosis not present

## 2018-05-25 LAB — CMP (CANCER CENTER ONLY)
ALT: 10 U/L (ref 0–44)
AST: 32 U/L (ref 15–41)
Albumin: 2.5 g/dL — ABNORMAL LOW (ref 3.5–5.0)
Alkaline Phosphatase: 400 U/L — ABNORMAL HIGH (ref 38–126)
Anion gap: 10 (ref 5–15)
BUN: 7 mg/dL — ABNORMAL LOW (ref 8–23)
CO2: 29 mmol/L (ref 22–32)
Calcium: 8.8 mg/dL — ABNORMAL LOW (ref 8.9–10.3)
Chloride: 103 mmol/L (ref 98–111)
Creatinine: 0.72 mg/dL (ref 0.61–1.24)
GFR, Est AFR Am: >60 mL/min (ref >60)
GFR, Estimated: >60 mL/min (ref >60)
Glucose, Bld: 115 mg/dL — ABNORMAL HIGH (ref 70–99)
Potassium: 4.1 mmol/L (ref 3.5–5.1)
Sodium: 142 mmol/L (ref 135–145)
TOTAL PROTEIN: 6.7 g/dL (ref 6.5–8.1)
Total Bilirubin: 0.6 mg/dL (ref 0.3–1.2)

## 2018-05-25 LAB — CBC WITH DIFFERENTIAL (CANCER CENTER ONLY)
Abs Immature Granulocytes: 0.11 10*3/uL — ABNORMAL HIGH (ref 0.00–0.07)
Basophils Absolute: 0 10*3/uL (ref 0.0–0.1)
Basophils Relative: 0 %
Eosinophils Absolute: 0.1 10*3/uL (ref 0.0–0.5)
Eosinophils Relative: 0 %
HCT: 33.7 % — ABNORMAL LOW (ref 39.0–52.0)
Hemoglobin: 10.5 g/dL — ABNORMAL LOW (ref 13.0–17.0)
IMMATURE GRANULOCYTES: 1 %
Lymphocytes Relative: 14 %
Lymphs Abs: 1.8 10*3/uL (ref 0.7–4.0)
MCH: 30.5 pg (ref 26.0–34.0)
MCHC: 31.2 g/dL (ref 30.0–36.0)
MCV: 98 fL (ref 80.0–100.0)
Monocytes Absolute: 1.6 10*3/uL — ABNORMAL HIGH (ref 0.1–1.0)
Monocytes Relative: 13 %
Neutro Abs: 9.1 10*3/uL — ABNORMAL HIGH (ref 1.7–7.7)
Neutrophils Relative %: 72 %
Platelet Count: 100 10*3/uL — ABNORMAL LOW (ref 150–400)
RBC: 3.44 MIL/uL — ABNORMAL LOW (ref 4.22–5.81)
RDW: 18.1 % — ABNORMAL HIGH (ref 11.5–15.5)
WBC Count: 12.7 10*3/uL — ABNORMAL HIGH (ref 4.0–10.5)
nRBC: 0 % (ref 0.0–0.2)

## 2018-05-25 LAB — CEA (IN HOUSE-CHCC): CEA (CHCC-In House): 979.15 ng/mL — ABNORMAL HIGH (ref 0.00–5.00)

## 2018-05-25 MED ORDER — SODIUM CHLORIDE 0.9% FLUSH
10.0000 mL | INTRAVENOUS | Status: DC | PRN
  Administered 2018-05-25: 10 mL
  Filled 2018-05-25: qty 10

## 2018-05-25 MED ORDER — HEPARIN SOD (PORK) LOCK FLUSH 100 UNIT/ML IV SOLN
500.0000 [IU] | Freq: Once | INTRAVENOUS | Status: DC | PRN
  Filled 2018-05-25: qty 5

## 2018-05-25 NOTE — Progress Notes (Signed)
  [place] OFFICE PROGRESS NOTE   Diagnosis: Metastatic adenocarcinoma  INTERVAL HISTORY:   [name] completed another cycle of FOLFOX 05/12/2018.  He reports fatigue lasting 3 to 4 days following chemotherapy.  No nausea or vomiting, mouth sores, or diarrhea.  Cold sensitivity lasted approximately 3 days.  No other neuropathy symptoms. He reports feeling the best he has in several months.  Good appetite.  Persistent leg edema.  The right abdominal pain has improved.  Objective:  Vital signs in last 24 hours:  Blood pressure 126/68, pulse 89, temperature 98.6 F (37 C), temperature source Oral, resp. rate 18, height 5\' 11"  (1.803 m), weight 186 lb 1.6 oz (84.4 kg), SpO2 99 %.    HEENT: No thrush or ulcers Resp: Lungs clear bilaterally Cardio: Regular rate and rhythm GI: The liver is palpable throughout the upper abdomen Vascular: 1+ pitting edema at the lower leg bilaterally  Skin: Diffuse dryness of the trunk and hands.  One linear ulcer at a distal finger  Portacath/PICC-without erythema  Lab Results:  Lab Results  Component Value Date   WBC 12.7 (H) 05/25/2018   HGB 10.5 (L) 05/25/2018   HCT 33.7 (L) 05/25/2018   MCV 98.0 05/25/2018   PLT 100 (L) 05/25/2018   NEUTROABS 9.1 (H) 05/25/2018    CMP  Lab Results  Component Value Date   NA 142 05/25/2018   K 4.1 05/25/2018   CL 103 05/25/2018   CO2 29 05/25/2018   GLUCOSE 115 (H) 05/25/2018   BUN 7 (L) 05/25/2018   CREATININE 0.72 05/25/2018   CALCIUM 8.8 (L) 05/25/2018   PROT 6.7 05/25/2018   ALBUMIN 2.5 (L) 05/25/2018   AST 32 05/25/2018   ALT 10 05/25/2018   ALKPHOS 400 (H) 05/25/2018   BILITOT 0.6 05/25/2018   GFRNONAA >60 05/25/2018   GFRAA >60 05/25/2018    Lab Results  Component Value Date   CEA1 632.0 (H) 04/24/2018    Medications: I have reviewed the patient's current medications.   Assessment/Plan: 1.Right lower quadrant massextensive liver metastases  CTs  04/22/2018-right lower quadrant mass associated with the ileum and cecum, hepatomegaly with extensive liver metastases, multiple lung nodules consistent with metastases  Colonoscopy 04/24/2018- multiple benign-appearing polyps removed, no colon mass  04/24/2018 chromogranin A 350  04/24/2018 CEA 632  Biopsy liver lesion 04/25/2018- adenocarcinoma with extensive necrosis  Cycle 1 FOLFOX 04/28/2018  PET scan 05/10/2018- widespread bilateral hepatic metastasis.  Hypermetabolic right lower quadrant mass.  Hypermetabolic ileocolic mesenteric nodal metastasis.  Cycle 2 FOLFOX 05/12/2018 (5-FU dose reduced due to mucositis following cycle 1)  Cycle 3 FOLFOX 05/26/2018 (oxaliplatin dose reduced secondary to thrombocytopenia)  2.Lower extremity edema secondary to massive hepatomegaly and hypoalbuminemia  3.Chronic back pain  4.Proximal leg weakness- deconditioning?, Spinal stenosis?  5.  Mucositis, candidiasis 05/06/2018.  Resolved following a course of Diflucan.    Disposition: [name] has completed 2 cycles of FOLFOX.  His clinical status has improved.  Levator liver enzymes are improved.  He will complete cycle 3 chemotherapy 05/26/2018.  We will dose reduce the oxalic platinum secondary to thrombocytopenia.  We will follow-up on the CEA from today.  [name] will return for an office visit and cycle 4 chemotherapy in 2 weeks.  He will be scheduled for a restaging CT evaluation after cycle 5.  Betsy Coder, MD  05/25/2018  9:06 AM

## 2018-05-26 ENCOUNTER — Inpatient Hospital Stay: Payer: 59

## 2018-05-26 VITALS — BP 138/68 | HR 84 | Temp 98.9°F | Resp 18

## 2018-05-26 DIAGNOSIS — C189 Malignant neoplasm of colon, unspecified: Secondary | ICD-10-CM

## 2018-05-26 DIAGNOSIS — C787 Secondary malignant neoplasm of liver and intrahepatic bile duct: Principal | ICD-10-CM

## 2018-05-26 MED ORDER — PALONOSETRON HCL INJECTION 0.25 MG/5ML
INTRAVENOUS | Status: AC
  Filled 2018-05-26: qty 5

## 2018-05-26 MED ORDER — DEXTROSE 5 % IV SOLN
Freq: Once | INTRAVENOUS | Status: AC
  Administered 2018-05-26: 14:00:00 via INTRAVENOUS
  Filled 2018-05-26: qty 250

## 2018-05-26 MED ORDER — DEXAMETHASONE SODIUM PHOSPHATE 10 MG/ML IJ SOLN
10.0000 mg | Freq: Once | INTRAMUSCULAR | Status: AC
  Administered 2018-05-26: 10 mg via INTRAVENOUS

## 2018-05-26 MED ORDER — LEUCOVORIN CALCIUM INJECTION 350 MG
300.0000 mg/m2 | Freq: Once | INTRAVENOUS | Status: AC
  Administered 2018-05-26: 616 mg via INTRAVENOUS
  Filled 2018-05-26: qty 30.8

## 2018-05-26 MED ORDER — DEXAMETHASONE SODIUM PHOSPHATE 10 MG/ML IJ SOLN
INTRAMUSCULAR | Status: AC
  Filled 2018-05-26: qty 1

## 2018-05-26 MED ORDER — OXALIPLATIN CHEMO INJECTION 100 MG/20ML
65.0000 mg/m2 | Freq: Once | INTRAVENOUS | Status: AC
  Administered 2018-05-26: 135 mg via INTRAVENOUS
  Filled 2018-05-26: qty 20

## 2018-05-26 MED ORDER — SODIUM CHLORIDE 0.9 % IV SOLN
1800.0000 mg/m2 | INTRAVENOUS | Status: DC
  Administered 2018-05-26: 3700 mg via INTRAVENOUS
  Filled 2018-05-26: qty 74

## 2018-05-26 MED ORDER — PALONOSETRON HCL INJECTION 0.25 MG/5ML
0.2500 mg | Freq: Once | INTRAVENOUS | Status: AC
  Administered 2018-05-26: 0.25 mg via INTRAVENOUS

## 2018-05-26 NOTE — Patient Instructions (Signed)
[facility] Discharge Instructions for Patients Receiving Chemotherapy  Today you received the following chemotherapy agents: Oxaliplatin/Leucovorin/5FU  To help prevent nausea and vomiting after your treatment, we encourage you to take your nausea medication as directed.   If you develop nausea and vomiting that is not controlled by your nausea medication, call the clinic.   BELOW ARE SYMPTOMS THAT SHOULD BE REPORTED IMMEDIATELY:  *FEVER GREATER THAN 100.5 F  *CHILLS WITH OR WITHOUT FEVER  NAUSEA AND VOMITING THAT IS NOT CONTROLLED WITH YOUR NAUSEA MEDICATION  *UNUSUAL SHORTNESS OF BREATH  *UNUSUAL BRUISING OR BLEEDING  TENDERNESS IN MOUTH AND THROAT WITH OR WITHOUT PRESENCE OF ULCERS  *URINARY PROBLEMS  *BOWEL PROBLEMS  UNUSUAL RASH Items with * indicate a potential emergency and should be followed up as soon as possible.  Feel free to call the clinic should you have any questions or concerns. The clinic phone number is [phone].  Please show the CHEMO ALERT CARD at check-in to the Emergency Department and triage nurse.   

## 2018-05-28 ENCOUNTER — Inpatient Hospital Stay: Payer: 59

## 2018-05-28 VITALS — BP 140/75 | HR 98 | Temp 98.6°F | Resp 16

## 2018-05-28 DIAGNOSIS — C787 Secondary malignant neoplasm of liver and intrahepatic bile duct: Principal | ICD-10-CM

## 2018-05-28 DIAGNOSIS — C189 Malignant neoplasm of colon, unspecified: Secondary | ICD-10-CM

## 2018-05-28 MED ORDER — HEPARIN SOD (PORK) LOCK FLUSH 100 UNIT/ML IV SOLN
500.0000 [IU] | Freq: Once | INTRAVENOUS | Status: DC | PRN
  Filled 2018-05-28: qty 5

## 2018-05-28 MED ORDER — SODIUM CHLORIDE 0.9% FLUSH
10.0000 mL | INTRAVENOUS | Status: DC | PRN
  Filled 2018-05-28: qty 10

## 2018-06-02 ENCOUNTER — Other Ambulatory Visit: Payer: Self-pay | Admitting: Nurse Practitioner

## 2018-06-02 ENCOUNTER — Telehealth: Payer: Self-pay | Admitting: *Deleted

## 2018-06-02 DIAGNOSIS — C787 Secondary malignant neoplasm of liver and intrahepatic bile duct: Secondary | ICD-10-CM

## 2018-06-02 MED ORDER — OXYCODONE-ACETAMINOPHEN 5-325 MG PO TABS: 1.0000 | ORAL_TABLET | ORAL | 0 refills | Status: DC | PRN

## 2018-06-02 NOTE — Telephone Encounter (Signed)
Called to request refill on pain medication. MD notified.

## 2018-06-05 ENCOUNTER — Other Ambulatory Visit: Payer: Self-pay | Admitting: Oncology

## 2018-06-09 ENCOUNTER — Inpatient Hospital Stay: Payer: 59

## 2018-06-09 ENCOUNTER — Encounter: Payer: Self-pay | Admitting: Nurse Practitioner

## 2018-06-09 ENCOUNTER — Telehealth: Payer: Self-pay | Admitting: Nurse Practitioner

## 2018-06-09 ENCOUNTER — Inpatient Hospital Stay (HOSPITAL_BASED_OUTPATIENT_CLINIC_OR_DEPARTMENT_OTHER): Payer: 59 | Admitting: Nurse Practitioner

## 2018-06-09 ENCOUNTER — Other Ambulatory Visit: Payer: Self-pay

## 2018-06-09 VITALS — BP 132/68 | HR 92 | Temp 98.2°F | Resp 19 | Ht 71.0 in | Wt 172.3 lb

## 2018-06-09 DIAGNOSIS — C801 Malignant (primary) neoplasm, unspecified: Secondary | ICD-10-CM | POA: Diagnosis not present

## 2018-06-09 DIAGNOSIS — Z95828 Presence of other vascular implants and grafts: Secondary | ICD-10-CM

## 2018-06-09 DIAGNOSIS — C189 Malignant neoplasm of colon, unspecified: Secondary | ICD-10-CM

## 2018-06-09 DIAGNOSIS — C7800 Secondary malignant neoplasm of unspecified lung: Secondary | ICD-10-CM | POA: Diagnosis not present

## 2018-06-09 DIAGNOSIS — C787 Secondary malignant neoplasm of liver and intrahepatic bile duct: Principal | ICD-10-CM

## 2018-06-09 LAB — CBC WITH DIFFERENTIAL (CANCER CENTER ONLY)
Abs Immature Granulocytes: 0.04 10*3/uL (ref 0.00–0.07)
Basophils Absolute: 0 10*3/uL (ref 0.0–0.1)
Basophils Relative: 0 %
Eosinophils Absolute: 0.1 10*3/uL (ref 0.0–0.5)
Eosinophils Relative: 1 %
HCT: 38.1 % — ABNORMAL LOW (ref 39.0–52.0)
Hemoglobin: 11.8 g/dL — ABNORMAL LOW (ref 13.0–17.0)
IMMATURE GRANULOCYTES: 0 %
Lymphocytes Relative: 18 %
Lymphs Abs: 2 10*3/uL (ref 0.7–4.0)
MCH: 30.5 pg (ref 26.0–34.0)
MCHC: 31 g/dL (ref 30.0–36.0)
MCV: 98.4 fL (ref 80.0–100.0)
Monocytes Absolute: 1.4 10*3/uL — ABNORMAL HIGH (ref 0.1–1.0)
Monocytes Relative: 13 %
NEUTROS ABS: 7.3 10*3/uL (ref 1.7–7.7)
Neutrophils Relative %: 68 %
Platelet Count: 127 10*3/uL — ABNORMAL LOW (ref 150–400)
RBC: 3.87 MIL/uL — AB (ref 4.22–5.81)
RDW: 18.7 % — ABNORMAL HIGH (ref 11.5–15.5)
WBC Count: 10.9 10*3/uL — ABNORMAL HIGH (ref 4.0–10.5)
nRBC: 0 % (ref 0.0–0.2)

## 2018-06-09 LAB — CMP (CANCER CENTER ONLY)
ALBUMIN: 3.1 g/dL — AB (ref 3.5–5.0)
ALT: 13 U/L (ref 0–44)
AST: 32 U/L (ref 15–41)
Alkaline Phosphatase: 299 U/L — ABNORMAL HIGH (ref 38–126)
Anion gap: 13 (ref 5–15)
BUN: 10 mg/dL (ref 8–23)
CO2: 24 mmol/L (ref 22–32)
Calcium: 9.3 mg/dL (ref 8.9–10.3)
Chloride: 104 mmol/L (ref 98–111)
Creatinine: 0.75 mg/dL (ref 0.61–1.24)
GFR, Est AFR Am: >60 mL/min (ref >60)
GFR, Estimated: >60 mL/min (ref >60)
GLUCOSE: 143 mg/dL — AB (ref 70–99)
POTASSIUM: 3.7 mmol/L (ref 3.5–5.1)
SODIUM: 141 mmol/L (ref 135–145)
Total Bilirubin: 0.7 mg/dL (ref 0.3–1.2)
Total Protein: 7.5 g/dL (ref 6.5–8.1)

## 2018-06-09 MED ORDER — DEXAMETHASONE SODIUM PHOSPHATE 10 MG/ML IJ SOLN
INTRAMUSCULAR | Status: AC
  Filled 2018-06-09: qty 1

## 2018-06-09 MED ORDER — PALONOSETRON HCL INJECTION 0.25 MG/5ML
INTRAVENOUS | Status: AC
  Filled 2018-06-09: qty 5

## 2018-06-09 MED ORDER — SODIUM CHLORIDE 0.9% FLUSH
10.0000 mL | INTRAVENOUS | Status: DC | PRN
  Administered 2018-06-09: 10 mL
  Filled 2018-06-09: qty 10

## 2018-06-09 MED ORDER — FLUCONAZOLE 100 MG PO TABS: 100.0000 mg | ORAL_TABLET | Freq: Every day | ORAL | 0 refills | Status: DC

## 2018-06-09 MED ORDER — PALONOSETRON HCL INJECTION 0.25 MG/5ML
0.2500 mg | Freq: Once | INTRAVENOUS | Status: AC
  Administered 2018-06-09: 0.25 mg via INTRAVENOUS

## 2018-06-09 MED ORDER — DEXTROSE 5 % IV SOLN
Freq: Once | INTRAVENOUS | Status: AC
  Administered 2018-06-09: 12:00:00 via INTRAVENOUS
  Filled 2018-06-09: qty 250

## 2018-06-09 MED ORDER — OMEPRAZOLE 20 MG PO CPDR: 20.0000 mg | DELAYED_RELEASE_CAPSULE | Freq: Every day | ORAL | 0 refills | Status: DC

## 2018-06-09 MED ORDER — LEUCOVORIN CALCIUM INJECTION 350 MG
300.0000 mg/m2 | Freq: Once | INTRAVENOUS | Status: AC
  Administered 2018-06-09: 616 mg via INTRAVENOUS
  Filled 2018-06-09: qty 30.8

## 2018-06-09 MED ORDER — DEXAMETHASONE SODIUM PHOSPHATE 10 MG/ML IJ SOLN
10.0000 mg | Freq: Once | INTRAMUSCULAR | Status: AC
  Administered 2018-06-09: 10 mg via INTRAVENOUS

## 2018-06-09 MED ORDER — OXALIPLATIN CHEMO INJECTION 100 MG/20ML
65.0000 mg/m2 | Freq: Once | INTRAVENOUS | Status: AC
  Administered 2018-06-09: 135 mg via INTRAVENOUS
  Filled 2018-06-09: qty 20

## 2018-06-09 MED ORDER — SODIUM CHLORIDE 0.9 % IV SOLN
1800.0000 mg/m2 | INTRAVENOUS | Status: DC
  Administered 2018-06-09: 3700 mg via INTRAVENOUS
  Filled 2018-06-09: qty 74

## 2018-06-09 NOTE — Progress Notes (Signed)
  [place] OFFICE PROGRESS NOTE   Diagnosis: Metastatic adenocarcinoma  INTERVAL HISTORY:   Thomas Riley returns as scheduled.  He completed cycle 3 FOLFOX 05/26/2018.  Oxaliplatin was dose reduced due to thrombocytopenia.  He denies nausea/vomiting.  He wonders if he has a sore on his tongue.  No diarrhea.  Cold sensitivity lasted about 1 day.  No persistent neuropathy symptoms.  Leg swelling continues to improve.  He has a good appetite.  Energy level has improved.  Objective:  Vital signs in last 24 hours:  Blood pressure 132/68, pulse 92, temperature 98.2 F (36.8 C), temperature source Oral, resp. rate 19, height 5\' 11"  (1.803 m), weight 172 lb 4.8 oz (78.2 kg), SpO2 99 %.    HEENT: Scattered white patches on the tongue. Resp: Lungs clear bilaterally. Cardio: Regular rate and rhythm. GI: Liver palpable right upper abdomen crossing midline. Vascular: Trace edema at the lower legs bilaterally.  Port-A-Cath without erythema.  Lab Results:  Lab Results  Component Value Date   WBC 10.9 (H) 06/09/2018   HGB 11.8 (L) 06/09/2018   HCT 38.1 (L) 06/09/2018   MCV 98.4 06/09/2018   PLT 127 (L) 06/09/2018   NEUTROABS 7.3 06/09/2018    Imaging:  No results found.  Medications: I have reviewed the patient's current medications.  Assessment/Plan: 1.Right lower quadrant massextensive liver metastases  CTs 04/22/2018-right lower quadrant mass associated with the ileum and cecum, hepatomegaly with extensive liver metastases, multiple lung nodules consistent with metastases  Colonoscopy 04/24/2018- multiple benign-appearing polyps removed, no colon mass  04/24/2018 chromogranin A 350  04/24/2018 CEA 632  Biopsy liver lesion 04/25/2018- adenocarcinoma with extensive necrosis  Cycle 1 FOLFOX 04/28/2018  PET scan 05/10/2018- widespread bilateral hepatic metastasis.  Hypermetabolic right lower quadrant mass.  Hypermetabolic ileocolic mesenteric nodal metastasis.   Cycle 2 FOLFOX 05/12/2018 (5-FU dose reduced due to mucositis following cycle 1)  Cycle 3 FOLFOX 05/26/2018 (oxaliplatin dose reduced secondary to thrombocytopenia)  2.Lower extremity edema secondary to massive hepatomegaly and hypoalbuminemia  3.Chronic back pain  4.Proximal leg weakness- deconditioning?, Spinal stenosis?  5.Mucositis, candidiasis 05/06/2018.  Resolved following a course of Diflucan.    Disposition: Thomas Riley has completed 3 cycles of FOLFOX.  His performance status has improved significantly.  Plan to proceed with cycle 4 today as scheduled.  The plan is for restaging CTs after cycle 5.  He may have oral candidiasis.  I will send his prescription to his pharmacy for Diflucan.  We reviewed the CBC from today.  Counts are adequate for treatment.  He will return for lab, follow-up and cycle 5 FOLFOX in 2 weeks.  He will contact the office in the interim with any problems.    [name] ANP/GNP-BC   06/09/2018  11:53 AM

## 2018-06-09 NOTE — Telephone Encounter (Signed)
Gave avs and calendar ° °

## 2018-06-09 NOTE — Patient Instructions (Signed)
[facility] Discharge Instructions for Patients Receiving Chemotherapy  Today you received the following chemotherapy agents: Oxaliplatin/Leucovorin/5FU  To help prevent nausea and vomiting after your treatment, we encourage you to take your nausea medication as directed.   If you develop nausea and vomiting that is not controlled by your nausea medication, call the clinic.   BELOW ARE SYMPTOMS THAT SHOULD BE REPORTED IMMEDIATELY:  *FEVER GREATER THAN 100.5 F  *CHILLS WITH OR WITHOUT FEVER  NAUSEA AND VOMITING THAT IS NOT CONTROLLED WITH YOUR NAUSEA MEDICATION  *UNUSUAL SHORTNESS OF BREATH  *UNUSUAL BRUISING OR BLEEDING  TENDERNESS IN MOUTH AND THROAT WITH OR WITHOUT PRESENCE OF ULCERS  *URINARY PROBLEMS  *BOWEL PROBLEMS  UNUSUAL RASH Items with * indicate a potential emergency and should be followed up as soon as possible.  Feel free to call the clinic should you have any questions or concerns. The clinic phone number is [phone].  Please show the CHEMO ALERT CARD at check-in to the Emergency Department and triage nurse.   

## 2018-06-11 ENCOUNTER — Other Ambulatory Visit: Payer: Self-pay

## 2018-06-11 ENCOUNTER — Inpatient Hospital Stay: Payer: 59

## 2018-06-11 VITALS — BP 135/72 | HR 92 | Temp 98.4°F | Resp 18

## 2018-06-11 DIAGNOSIS — C189 Malignant neoplasm of colon, unspecified: Secondary | ICD-10-CM

## 2018-06-11 DIAGNOSIS — C787 Secondary malignant neoplasm of liver and intrahepatic bile duct: Secondary | ICD-10-CM | POA: Diagnosis not present

## 2018-06-11 MED ORDER — SODIUM CHLORIDE 0.9% FLUSH
10.0000 mL | INTRAVENOUS | Status: DC | PRN
  Administered 2018-06-11: 10 mL
  Filled 2018-06-11: qty 10

## 2018-06-11 MED ORDER — HEPARIN SOD (PORK) LOCK FLUSH 100 UNIT/ML IV SOLN
500.0000 [IU] | Freq: Once | INTRAVENOUS | Status: AC | PRN
  Administered 2018-06-11: 500 [IU]
  Filled 2018-06-11: qty 5

## 2018-06-11 NOTE — Patient Instructions (Signed)

## 2018-06-16 ENCOUNTER — Telehealth: Payer: Self-pay | Admitting: *Deleted

## 2018-06-16 ENCOUNTER — Telehealth: Payer: Self-pay | Admitting: Oncology

## 2018-06-16 ENCOUNTER — Other Ambulatory Visit: Payer: Self-pay | Admitting: Nurse Practitioner

## 2018-06-16 DIAGNOSIS — C787 Secondary malignant neoplasm of liver and intrahepatic bile duct: Secondary | ICD-10-CM

## 2018-06-16 MED ORDER — OXYCODONE-ACETAMINOPHEN 5-325 MG PO TABS: 1.0000 | ORAL_TABLET | ORAL | 0 refills | Status: DC | PRN

## 2018-06-16 NOTE — Telephone Encounter (Signed)
Faxed medical records to [facility]. Release [id number]

## 2018-06-16 NOTE — Telephone Encounter (Signed)
Wife called requesting refill on oxycodone/apap be sent to [facility] on [address]. Will be out soon. Forwarded to NP.

## 2018-06-19 ENCOUNTER — Other Ambulatory Visit: Payer: Self-pay | Admitting: Oncology

## 2018-06-23 ENCOUNTER — Inpatient Hospital Stay (HOSPITAL_BASED_OUTPATIENT_CLINIC_OR_DEPARTMENT_OTHER): Payer: 59 | Admitting: Oncology

## 2018-06-23 ENCOUNTER — Inpatient Hospital Stay: Payer: 59

## 2018-06-23 ENCOUNTER — Telehealth: Payer: Self-pay | Admitting: Oncology

## 2018-06-23 ENCOUNTER — Inpatient Hospital Stay: Payer: 59 | Attending: Oncology

## 2018-06-23 ENCOUNTER — Other Ambulatory Visit: Payer: Self-pay

## 2018-06-23 ENCOUNTER — Encounter: Payer: Self-pay | Admitting: Oncology

## 2018-06-23 VITALS — BP 139/77 | HR 95 | Temp 97.4°F | Resp 18 | Ht 71.0 in | Wt 168.5 lb

## 2018-06-23 DIAGNOSIS — C787 Secondary malignant neoplasm of liver and intrahepatic bile duct: Secondary | ICD-10-CM

## 2018-06-23 DIAGNOSIS — G8929 Other chronic pain: Secondary | ICD-10-CM | POA: Diagnosis not present

## 2018-06-23 DIAGNOSIS — C7800 Secondary malignant neoplasm of unspecified lung: Secondary | ICD-10-CM | POA: Insufficient documentation

## 2018-06-23 DIAGNOSIS — Z95828 Presence of other vascular implants and grafts: Secondary | ICD-10-CM

## 2018-06-23 DIAGNOSIS — C180 Malignant neoplasm of cecum: Secondary | ICD-10-CM | POA: Diagnosis not present

## 2018-06-23 DIAGNOSIS — E8809 Other disorders of plasma-protein metabolism, not elsewhere classified: Secondary | ICD-10-CM | POA: Insufficient documentation

## 2018-06-23 DIAGNOSIS — Z5111 Encounter for antineoplastic chemotherapy: Secondary | ICD-10-CM | POA: Diagnosis not present

## 2018-06-23 DIAGNOSIS — R160 Hepatomegaly, not elsewhere classified: Secondary | ICD-10-CM

## 2018-06-23 DIAGNOSIS — M549 Dorsalgia, unspecified: Secondary | ICD-10-CM | POA: Diagnosis not present

## 2018-06-23 DIAGNOSIS — K1230 Oral mucositis (ulcerative), unspecified: Secondary | ICD-10-CM | POA: Insufficient documentation

## 2018-06-23 DIAGNOSIS — D696 Thrombocytopenia, unspecified: Secondary | ICD-10-CM | POA: Insufficient documentation

## 2018-06-23 DIAGNOSIS — C189 Malignant neoplasm of colon, unspecified: Secondary | ICD-10-CM

## 2018-06-23 DIAGNOSIS — C772 Secondary and unspecified malignant neoplasm of intra-abdominal lymph nodes: Secondary | ICD-10-CM

## 2018-06-23 LAB — CMP (CANCER CENTER ONLY)
ALT: 10 U/L (ref 0–44)
AST: 28 U/L (ref 15–41)
Albumin: 3.2 g/dL — ABNORMAL LOW (ref 3.5–5.0)
Alkaline Phosphatase: 244 U/L — ABNORMAL HIGH (ref 38–126)
Anion gap: 11 (ref 5–15)
BUN: 12 mg/dL (ref 8–23)
CO2: 24 mmol/L (ref 22–32)
Calcium: 9.6 mg/dL (ref 8.9–10.3)
Chloride: 105 mmol/L (ref 98–111)
Creatinine: 0.8 mg/dL (ref 0.61–1.24)
GFR, Est AFR Am: >60 mL/min (ref >60)
GFR, Estimated: >60 mL/min (ref >60)
Glucose, Bld: 136 mg/dL — ABNORMAL HIGH (ref 70–99)
Potassium: 3.7 mmol/L (ref 3.5–5.1)
Sodium: 140 mmol/L (ref 135–145)
Total Bilirubin: 0.6 mg/dL (ref 0.3–1.2)
Total Protein: 7.6 g/dL (ref 6.5–8.1)

## 2018-06-23 LAB — CBC WITH DIFFERENTIAL (CANCER CENTER ONLY)
Abs Immature Granulocytes: 0.02 10*3/uL (ref 0.00–0.07)
Basophils Absolute: 0.1 10*3/uL (ref 0.0–0.1)
Basophils Relative: 1 %
Eosinophils Absolute: 0.1 10*3/uL (ref 0.0–0.5)
Eosinophils Relative: 1 %
HCT: 40.5 % (ref 39.0–52.0)
Hemoglobin: 12.8 g/dL — ABNORMAL LOW (ref 13.0–17.0)
Immature Granulocytes: 0 %
Lymphocytes Relative: 19 %
Lymphs Abs: 1.7 10*3/uL (ref 0.7–4.0)
MCH: 31 pg (ref 26.0–34.0)
MCHC: 31.6 g/dL (ref 30.0–36.0)
MCV: 98.1 fL (ref 80.0–100.0)
Monocytes Absolute: 1.5 10*3/uL — ABNORMAL HIGH (ref 0.1–1.0)
Monocytes Relative: 17 %
Neutro Abs: 5.4 10*3/uL (ref 1.7–7.7)
Neutrophils Relative %: 62 %
Platelet Count: 115 10*3/uL — ABNORMAL LOW (ref 150–400)
RBC: 4.13 MIL/uL — ABNORMAL LOW (ref 4.22–5.81)
RDW: 18 % — ABNORMAL HIGH (ref 11.5–15.5)
WBC Count: 8.6 10*3/uL (ref 4.0–10.5)
nRBC: 0 % (ref 0.0–0.2)

## 2018-06-23 LAB — CEA (IN HOUSE-CHCC): CEA (CHCC-In House): 441.12 ng/mL — ABNORMAL HIGH (ref 0.00–5.00)

## 2018-06-23 MED ORDER — DEXAMETHASONE SODIUM PHOSPHATE 10 MG/ML IJ SOLN
10.0000 mg | Freq: Once | INTRAMUSCULAR | Status: AC
  Administered 2018-06-23: 10 mg via INTRAVENOUS

## 2018-06-23 MED ORDER — OXALIPLATIN CHEMO INJECTION 100 MG/20ML
68.0000 mg/m2 | Freq: Once | INTRAVENOUS | Status: AC
  Administered 2018-06-23: 135 mg via INTRAVENOUS
  Filled 2018-06-23: qty 27

## 2018-06-23 MED ORDER — SODIUM CHLORIDE 0.9 % IV SOLN
1900.0000 mg/m2 | INTRAVENOUS | Status: DC
  Administered 2018-06-23: 3700 mg via INTRAVENOUS
  Filled 2018-06-23: qty 74

## 2018-06-23 MED ORDER — PALONOSETRON HCL INJECTION 0.25 MG/5ML
0.2500 mg | Freq: Once | INTRAVENOUS | Status: AC
  Administered 2018-06-23: 0.25 mg via INTRAVENOUS

## 2018-06-23 MED ORDER — LEUCOVORIN CALCIUM INJECTION 350 MG
314.0000 mg/m2 | Freq: Once | INTRAVENOUS | Status: AC
  Administered 2018-06-23: 616 mg via INTRAVENOUS
  Filled 2018-06-23: qty 30.8

## 2018-06-23 MED ORDER — PALONOSETRON HCL INJECTION 0.25 MG/5ML
INTRAVENOUS | Status: AC
  Filled 2018-06-23: qty 5

## 2018-06-23 MED ORDER — SODIUM CHLORIDE 0.9% FLUSH
10.0000 mL | INTRAVENOUS | Status: DC | PRN
  Administered 2018-06-23: 10:00:00 10 mL
  Filled 2018-06-23: qty 10

## 2018-06-23 MED ORDER — DEXTROSE 5 % IV SOLN
Freq: Once | INTRAVENOUS | Status: AC
  Administered 2018-06-23: 11:00:00 via INTRAVENOUS
  Filled 2018-06-23: qty 250

## 2018-06-23 MED ORDER — DEXAMETHASONE SODIUM PHOSPHATE 10 MG/ML IJ SOLN
INTRAMUSCULAR | Status: AC
  Filled 2018-06-23: qty 1

## 2018-06-23 MED ORDER — DEXTROSE 5 % IV SOLN
Freq: Once | INTRAVENOUS | Status: AC
  Administered 2018-06-23: 12:00:00 via INTRAVENOUS
  Filled 2018-06-23: qty 250

## 2018-06-23 NOTE — Patient Instructions (Signed)
[facility] Discharge Instructions for Patients Receiving Chemotherapy  Today you received the following chemotherapy agents :  Oxaliplatin,  Leucovorin,  Fluorouracil.  To help prevent nausea and vomiting after your treatment, we encourage you to take your nausea medication as prescribed.   If you develop nausea and vomiting that is not controlled by your nausea medication, call the clinic.   BELOW ARE SYMPTOMS THAT SHOULD BE REPORTED IMMEDIATELY:  *FEVER GREATER THAN 100.5 F  *CHILLS WITH OR WITHOUT FEVER  NAUSEA AND VOMITING THAT IS NOT CONTROLLED WITH YOUR NAUSEA MEDICATION  *UNUSUAL SHORTNESS OF BREATH  *UNUSUAL BRUISING OR BLEEDING  TENDERNESS IN MOUTH AND THROAT WITH OR WITHOUT PRESENCE OF ULCERS  *URINARY PROBLEMS  *BOWEL PROBLEMS  UNUSUAL RASH Items with * indicate a potential emergency and should be followed up as soon as possible.  Feel free to call the clinic should you have any questions or concerns. The clinic phone number is [phone].  Please show the CHEMO ALERT CARD at check-in to the Emergency Department and triage nurse.   

## 2018-06-23 NOTE — Progress Notes (Signed)
Went to lobby to introduce myself as Arboriculturist and to offer available resources.  Discussed one-time $700 [place] to assist with personal expenses while going through treatment.  Gave him my card if interested in applying and for any additional financial questions or concerns. He verbalized understanding.

## 2018-06-23 NOTE — Telephone Encounter (Signed)
Scheduled appt per 4/2 los.

## 2018-06-23 NOTE — Progress Notes (Signed)
  [place] OFFICE PROGRESS NOTE   Diagnosis: Metastatic carcinoma  INTERVAL HISTORY:   [name] completed another cycle of FOLFOX on 06/09/2018.  He reports resolution of mouth soreness after taking Diflucan.  No nausea/vomiting, diarrhea, or neuropathy symptoms.  Leg swelling has improved.  He continues to have intermittent abdominal pain, but this has also improved.  Objective:  Vital signs in last 24 hours:  Blood pressure 139/77, pulse 95, temperature (!) 97.4 F (36.3 C), temperature source Oral, resp. rate 18, height 5\' 11"  (1.803 m), weight 168 lb 8 oz (76.4 kg), SpO2 99 %.    HEENT: White coat over the tongue, no buccal thrush or ulcers GI: The liver is palpable throughout the upper abdomen, nontender Vascular: No leg edema  Skin: Dryness and mild erythema at the palms, no skin breakdown  Portacath/PICC-without erythema  Lab Results:  Lab Results  Component Value Date   WBC 8.6 06/23/2018   HGB 12.8 (L) 06/23/2018   HCT 40.5 06/23/2018   MCV 98.1 06/23/2018   PLT 115 (L) 06/23/2018   NEUTROABS 5.4 06/23/2018    CMP  Lab Results  Component Value Date   NA 141 06/09/2018   K 3.7 06/09/2018   CL 104 06/09/2018   CO2 24 06/09/2018   GLUCOSE 143 (H) 06/09/2018   BUN 10 06/09/2018   CREATININE 0.75 06/09/2018   CALCIUM 9.3 06/09/2018   PROT 7.5 06/09/2018   ALBUMIN 3.1 (L) 06/09/2018   AST 32 06/09/2018   ALT 13 06/09/2018   ALKPHOS 299 (H) 06/09/2018   BILITOT 0.7 06/09/2018   GFRNONAA >60 06/09/2018   GFRAA >60 06/09/2018    Lab Results  Component Value Date   CEA1 979.15 (H) 05/25/2018     Medications: I have reviewed the patient's current medications.   Assessment/Plan: 1. Right lower quadrant massextensive liver metastases  CTs 04/22/2018-right lower quadrant mass associated with the ileum and cecum, hepatomegaly with extensive liver metastases, multiple lung nodules consistent with metastases  Colonoscopy  04/24/2018- multiple benign-appearing polyps removed, no colon mass  04/24/2018 chromogranin A 350  04/24/2018 CEA 632  Biopsy liver lesion 04/25/2018- adenocarcinoma with extensive necrosis  Cycle 1 FOLFOX 04/28/2018  PET scan 05/10/2018- widespread bilateral hepatic metastasis.  Hypermetabolic right lower quadrant mass.  Hypermetabolic ileocolic mesenteric nodal metastasis.  Cycle 2 FOLFOX 05/12/2018 (5-FU dose reduced due to mucositis following cycle 1)  Cycle 3 FOLFOX 05/26/2018 (oxaliplatin dose reduced secondary to thrombocytopenia)  Cycle 4 FOLFOX 06/09/2018  Cycle 5 FOLFOX 06/23/2018  2.Lower extremity edema secondary to massive hepatomegaly and hypoalbuminemia  3.Chronic back pain  4.Proximal leg weakness- deconditioning?, Spinal stenosis?  5.Mucositis, candidiasis 05/06/2018.  Resolved following a course of Diflucan.     Disposition: [name] has completed 4 cycles of FOLFOX.  He is clinical status is much improved.  He will complete cycle 5 today.  We will follow-up on the CEA from today.  He will undergo a restaging CT after this cycle.  I discussed treatment plans and his current status with his wife by telephone.  [name] will continue oxycodone as needed for pain.  He will return for an office visit and chemotherapy in 2 weeks.  Betsy Coder, MD  06/23/2018  10:28 AM

## 2018-06-24 ENCOUNTER — Encounter: Payer: Self-pay | Admitting: Oncology

## 2018-06-24 NOTE — Progress Notes (Signed)
Patient called to inquire about not receiving a bill and OOP. Advised patient his insurance company would be able to advise him where he stands to date as far as OOP and he hasn't received a bill due to charges pending for insurance payment. He verbalized understanding.  He also inquired about the in-house grant qualifications. He states his household is over the income maximum provided verbally.  He has my card for any additional financial questions or concerns.

## 2018-06-25 ENCOUNTER — Inpatient Hospital Stay: Payer: 59

## 2018-06-25 ENCOUNTER — Other Ambulatory Visit: Payer: Self-pay

## 2018-06-25 VITALS — BP 136/75 | HR 94 | Temp 98.4°F | Resp 17

## 2018-06-25 DIAGNOSIS — Z5111 Encounter for antineoplastic chemotherapy: Secondary | ICD-10-CM | POA: Diagnosis not present

## 2018-06-25 DIAGNOSIS — C189 Malignant neoplasm of colon, unspecified: Secondary | ICD-10-CM

## 2018-06-25 DIAGNOSIS — C787 Secondary malignant neoplasm of liver and intrahepatic bile duct: Principal | ICD-10-CM

## 2018-06-25 MED ORDER — SODIUM CHLORIDE 0.9% FLUSH
10.0000 mL | INTRAVENOUS | Status: DC | PRN
  Administered 2018-06-25: 12:00:00 10 mL
  Filled 2018-06-25: qty 10

## 2018-06-25 MED ORDER — HEPARIN SOD (PORK) LOCK FLUSH 100 UNIT/ML IV SOLN
500.0000 [IU] | Freq: Once | INTRAVENOUS | Status: AC | PRN
  Administered 2018-06-25: 500 [IU]
  Filled 2018-06-25: qty 5

## 2018-06-28 ENCOUNTER — Telehealth: Payer: Self-pay | Admitting: *Deleted

## 2018-06-28 NOTE — Telephone Encounter (Signed)
Pt had called after hours nurse stating he was constipated.  Called him to follow up. States he took [place] and bowels are moving without further problems

## 2018-06-30 ENCOUNTER — Other Ambulatory Visit: Payer: Self-pay | Admitting: Nurse Practitioner

## 2018-06-30 DIAGNOSIS — C787 Secondary malignant neoplasm of liver and intrahepatic bile duct: Secondary | ICD-10-CM

## 2018-06-30 MED ORDER — OXYCODONE-ACETAMINOPHEN 5-325 MG PO TABS: 1.0000 | ORAL_TABLET | ORAL | 0 refills | Status: DC | PRN

## 2018-07-01 ENCOUNTER — Other Ambulatory Visit: Payer: Self-pay

## 2018-07-01 ENCOUNTER — Encounter (HOSPITAL_COMMUNITY): Payer: Self-pay

## 2018-07-01 ENCOUNTER — Other Ambulatory Visit: Payer: Self-pay | Admitting: Nurse Practitioner

## 2018-07-01 ENCOUNTER — Ambulatory Visit (HOSPITAL_COMMUNITY)
Admission: RE | Admit: 2018-07-01 | Discharge: 2018-07-01 | Disposition: A | Discharge status: Home / Self Care | Payer: 59 | Source: Ambulatory Visit | Attending: Oncology | Admitting: Oncology

## 2018-07-01 ENCOUNTER — Telehealth: Payer: Self-pay | Admitting: *Deleted

## 2018-07-01 DIAGNOSIS — C189 Malignant neoplasm of colon, unspecified: Secondary | ICD-10-CM | POA: Diagnosis not present

## 2018-07-01 DIAGNOSIS — C787 Secondary malignant neoplasm of liver and intrahepatic bile duct: Secondary | ICD-10-CM | POA: Diagnosis present

## 2018-07-01 MED ORDER — SODIUM CHLORIDE (PF) 0.9 % IJ SOLN
INTRAMUSCULAR | Status: AC
  Filled 2018-07-01: qty 50

## 2018-07-01 MED ORDER — HEPARIN SOD (PORK) LOCK FLUSH 100 UNIT/ML IV SOLN
500.0000 [IU] | Freq: Once | INTRAVENOUS | Status: AC
  Administered 2018-07-01: 08:00:00 500 [IU] via INTRAVENOUS

## 2018-07-01 MED ORDER — HEPARIN SOD (PORK) LOCK FLUSH 100 UNIT/ML IV SOLN
INTRAVENOUS | Status: AC
  Administered 2018-07-01: 08:00:00 500 [IU] via INTRAVENOUS
  Filled 2018-07-01: qty 5

## 2018-07-01 MED ORDER — IOHEXOL 300 MG/ML  SOLN
100.0000 mL | Freq: Once | INTRAMUSCULAR | Status: AC | PRN
  Administered 2018-07-01: 08:00:00 100 mL via INTRAVENOUS

## 2018-07-01 NOTE — Telephone Encounter (Signed)
Spoke with pt and informed him re:  Per Dr. Benay Spice,  CT reveals improvement in the liver metastases and right lower quadrant mass;  Plan to continue FOLFOX ;  Follow up as scheduled.  Pt voiced understanding.

## 2018-07-03 ENCOUNTER — Other Ambulatory Visit: Payer: Self-pay | Admitting: Oncology

## 2018-07-03 ENCOUNTER — Other Ambulatory Visit: Payer: Self-pay | Admitting: Nurse Practitioner

## 2018-07-03 DIAGNOSIS — C787 Secondary malignant neoplasm of liver and intrahepatic bile duct: Principal | ICD-10-CM

## 2018-07-03 DIAGNOSIS — C189 Malignant neoplasm of colon, unspecified: Secondary | ICD-10-CM

## 2018-07-07 ENCOUNTER — Other Ambulatory Visit: Payer: Self-pay

## 2018-07-07 ENCOUNTER — Telehealth: Payer: Self-pay | Admitting: Oncology

## 2018-07-07 ENCOUNTER — Inpatient Hospital Stay: Payer: 59

## 2018-07-07 ENCOUNTER — Encounter: Payer: Self-pay | Admitting: Oncology

## 2018-07-07 ENCOUNTER — Inpatient Hospital Stay (HOSPITAL_BASED_OUTPATIENT_CLINIC_OR_DEPARTMENT_OTHER): Payer: 59 | Admitting: Oncology

## 2018-07-07 VITALS — BP 129/73 | HR 81 | Temp 98.5°F | Resp 18 | Ht 71.0 in | Wt 167.5 lb

## 2018-07-07 DIAGNOSIS — M549 Dorsalgia, unspecified: Secondary | ICD-10-CM

## 2018-07-07 DIAGNOSIS — E8809 Other disorders of plasma-protein metabolism, not elsewhere classified: Secondary | ICD-10-CM

## 2018-07-07 DIAGNOSIS — R160 Hepatomegaly, not elsewhere classified: Secondary | ICD-10-CM

## 2018-07-07 DIAGNOSIS — C7800 Secondary malignant neoplasm of unspecified lung: Secondary | ICD-10-CM | POA: Diagnosis not present

## 2018-07-07 DIAGNOSIS — K1230 Oral mucositis (ulcerative), unspecified: Secondary | ICD-10-CM

## 2018-07-07 DIAGNOSIS — G8929 Other chronic pain: Secondary | ICD-10-CM

## 2018-07-07 DIAGNOSIS — C180 Malignant neoplasm of cecum: Secondary | ICD-10-CM

## 2018-07-07 DIAGNOSIS — C787 Secondary malignant neoplasm of liver and intrahepatic bile duct: Principal | ICD-10-CM

## 2018-07-07 DIAGNOSIS — C772 Secondary and unspecified malignant neoplasm of intra-abdominal lymph nodes: Secondary | ICD-10-CM

## 2018-07-07 DIAGNOSIS — C189 Malignant neoplasm of colon, unspecified: Secondary | ICD-10-CM

## 2018-07-07 DIAGNOSIS — Z5111 Encounter for antineoplastic chemotherapy: Secondary | ICD-10-CM | POA: Diagnosis not present

## 2018-07-07 DIAGNOSIS — Z95828 Presence of other vascular implants and grafts: Secondary | ICD-10-CM

## 2018-07-07 LAB — CBC WITH DIFFERENTIAL (CANCER CENTER ONLY)
Abs Immature Granulocytes: 0.04 10*3/uL (ref 0.00–0.07)
Basophils Absolute: 0 10*3/uL (ref 0.0–0.1)
Basophils Relative: 0 %
Eosinophils Absolute: 0 10*3/uL (ref 0.0–0.5)
Eosinophils Relative: 0 %
HCT: 41.2 % (ref 39.0–52.0)
Hemoglobin: 12.9 g/dL — ABNORMAL LOW (ref 13.0–17.0)
Immature Granulocytes: 0 %
Lymphocytes Relative: 20 %
Lymphs Abs: 1.8 10*3/uL (ref 0.7–4.0)
MCH: 30.7 pg (ref 26.0–34.0)
MCHC: 31.3 g/dL (ref 30.0–36.0)
MCV: 98.1 fL (ref 80.0–100.0)
Monocytes Absolute: 1.4 10*3/uL — ABNORMAL HIGH (ref 0.1–1.0)
Monocytes Relative: 15 %
Neutro Abs: 5.9 10*3/uL (ref 1.7–7.7)
Neutrophils Relative %: 65 %
Platelet Count: 103 10*3/uL — ABNORMAL LOW (ref 150–400)
RBC: 4.2 MIL/uL — ABNORMAL LOW (ref 4.22–5.81)
RDW: 17.3 % — ABNORMAL HIGH (ref 11.5–15.5)
WBC Count: 9.2 10*3/uL (ref 4.0–10.5)
nRBC: 0 % (ref 0.0–0.2)

## 2018-07-07 LAB — CMP (CANCER CENTER ONLY)
ALT: 10 U/L (ref 0–44)
AST: 30 U/L (ref 15–41)
Albumin: 3.4 g/dL — ABNORMAL LOW (ref 3.5–5.0)
Alkaline Phosphatase: 210 U/L — ABNORMAL HIGH (ref 38–126)
Anion gap: 13 (ref 5–15)
BUN: 12 mg/dL (ref 8–23)
CO2: 21 mmol/L — ABNORMAL LOW (ref 22–32)
Calcium: 9.4 mg/dL (ref 8.9–10.3)
Chloride: 106 mmol/L (ref 98–111)
Creatinine: 0.76 mg/dL (ref 0.61–1.24)
GFR, Est AFR Am: >60 mL/min (ref >60)
GFR, Estimated: >60 mL/min (ref >60)
Glucose, Bld: 84 mg/dL (ref 70–99)
Potassium: 3.9 mmol/L (ref 3.5–5.1)
Sodium: 140 mmol/L (ref 135–145)
Total Bilirubin: 0.5 mg/dL (ref 0.3–1.2)
Total Protein: 7.9 g/dL (ref 6.5–8.1)

## 2018-07-07 LAB — CEA (IN HOUSE-CHCC): CEA (CHCC-In House): 359.5 ng/mL — ABNORMAL HIGH (ref 0.00–5.00)

## 2018-07-07 MED ORDER — OXALIPLATIN CHEMO INJECTION 100 MG/20ML
65.0000 mg/m2 | Freq: Once | INTRAVENOUS | Status: AC
  Administered 2018-07-07: 125 mg via INTRAVENOUS
  Filled 2018-07-07: qty 20

## 2018-07-07 MED ORDER — PALONOSETRON HCL INJECTION 0.25 MG/5ML
0.2500 mg | Freq: Once | INTRAVENOUS | Status: AC
  Administered 2018-07-07: 0.25 mg via INTRAVENOUS

## 2018-07-07 MED ORDER — FLUCONAZOLE 100 MG PO TABS: 100.0000 mg | ORAL_TABLET | Freq: Every day | ORAL | 0 refills | Status: DC

## 2018-07-07 MED ORDER — DEXTROSE 5 % IV SOLN
Freq: Once | INTRAVENOUS | Status: AC
  Administered 2018-07-07: 12:00:00 via INTRAVENOUS
  Filled 2018-07-07: qty 250

## 2018-07-07 MED ORDER — SODIUM CHLORIDE 0.9 % IV SOLN
1800.0000 mg/m2 | INTRAVENOUS | Status: DC
  Administered 2018-07-07: 3550 mg via INTRAVENOUS
  Filled 2018-07-07: qty 71

## 2018-07-07 MED ORDER — LEUCOVORIN CALCIUM INJECTION 350 MG
300.0000 mg/m2 | Freq: Once | INTRAVENOUS | Status: AC
  Administered 2018-07-07: 588 mg via INTRAVENOUS
  Filled 2018-07-07: qty 29.4

## 2018-07-07 MED ORDER — SODIUM CHLORIDE 0.9% FLUSH
10.0000 mL | INTRAVENOUS | Status: DC | PRN
  Administered 2018-07-07: 10 mL
  Filled 2018-07-07: qty 10

## 2018-07-07 MED ORDER — DEXAMETHASONE SODIUM PHOSPHATE 10 MG/ML IJ SOLN
10.0000 mg | Freq: Once | INTRAMUSCULAR | Status: AC
  Administered 2018-07-07: 10 mg via INTRAVENOUS

## 2018-07-07 MED ORDER — PALONOSETRON HCL INJECTION 0.25 MG/5ML
INTRAVENOUS | Status: AC
  Filled 2018-07-07: qty 5

## 2018-07-07 MED ORDER — DEXAMETHASONE SODIUM PHOSPHATE 10 MG/ML IJ SOLN
INTRAMUSCULAR | Status: AC
  Filled 2018-07-07: qty 1

## 2018-07-07 NOTE — Addendum Note (Signed)
Addended by: Lenox Ponds E on: 07/07/2018 02:47 PM   Modules accepted: Orders

## 2018-07-07 NOTE — Telephone Encounter (Signed)
Scheduled appt per 4/16 los. °

## 2018-07-07 NOTE — Patient Instructions (Signed)

## 2018-07-07 NOTE — Patient Instructions (Signed)
[facility] Discharge Instructions for Patients Receiving Chemotherapy  Today you received the following chemotherapy agents :  Oxaliplatin,  Leucovorin,  Fluorouracil.  To help prevent nausea and vomiting after your treatment, we encourage you to take your nausea medication as prescribed.   If you develop nausea and vomiting that is not controlled by your nausea medication, call the clinic.   BELOW ARE SYMPTOMS THAT SHOULD BE REPORTED IMMEDIATELY:  *FEVER GREATER THAN 100.5 F  *CHILLS WITH OR WITHOUT FEVER  NAUSEA AND VOMITING THAT IS NOT CONTROLLED WITH YOUR NAUSEA MEDICATION  *UNUSUAL SHORTNESS OF BREATH  *UNUSUAL BRUISING OR BLEEDING  TENDERNESS IN MOUTH AND THROAT WITH OR WITHOUT PRESENCE OF ULCERS  *URINARY PROBLEMS  *BOWEL PROBLEMS  UNUSUAL RASH Items with * indicate a potential emergency and should be followed up as soon as possible.  Feel free to call the clinic should you have any questions or concerns. The clinic phone number is [phone].  Please show the CHEMO ALERT CARD at check-in to the Emergency Department and triage nurse.   

## 2018-07-07 NOTE — Progress Notes (Signed)
Per Dr. Benay Spice doses of 5-FU, oxaliplatin, and leucovorin were adjusted for recent weight loss and edema in lower extremities.   Jalene Mullet, PharmD PGY2 Hematology/ Oncology Pharmacy Resident 07/07/2018 12:35 PM

## 2018-07-07 NOTE — Progress Notes (Signed)
[place] OFFICE PROGRESS NOTE   Diagnosis: Colon cancer  INTERVAL HISTORY:   [name] completed another cycle of FOLFOX on 06/23/2018.  No mouth sores, diarrhea, or neuropathy symptoms.  Constipation is relieved with milk of magnesia.  He has noted a white coating over the tongue.  Abdominal pain has improved, but he had mild lower abdominal pain this week.  Good appetite.  The leg edema has resolved.  Objective:  Vital signs in last 24 hours:  Blood pressure 129/73, pulse 81, temperature 98.5 F (36.9 C), temperature source Oral, resp. rate 18, height 5\' 11"  (1.803 m), weight 167 lb 8 oz (76 kg), SpO2 100 %.    HEENT: White coat over the tongue, no buccal thrush, no ulcers GI: The liver edge is palpable approximately 3 fingers above the umbilicus.  No splenomegaly.  Nontender. Vascular: No leg edema  Skin: Dryness of the forehead and hands  Portacath/PICC-without erythema  Lab Results:  Lab Results  Component Value Date   WBC 9.2 07/07/2018   HGB 12.9 (L) 07/07/2018   HCT 41.2 07/07/2018   MCV 98.1 07/07/2018   PLT 103 (L) 07/07/2018   NEUTROABS 5.9 07/07/2018    CMP  Lab Results  Component Value Date   NA 140 07/07/2018   K 3.9 07/07/2018   CL 106 07/07/2018   CO2 21 (L) 07/07/2018   GLUCOSE 84 07/07/2018   BUN 12 07/07/2018   CREATININE 0.76 07/07/2018   CALCIUM 9.4 07/07/2018   PROT 7.9 07/07/2018   ALBUMIN 3.4 (L) 07/07/2018   AST 30 07/07/2018   ALT 10 07/07/2018   ALKPHOS 210 (H) 07/07/2018   BILITOT 0.5 07/07/2018   GFRNONAA >60 07/07/2018   GFRAA >60 07/07/2018    Lab Results  Component Value Date   CEA1 441.12 (H) 06/23/2018      Imaging:  CT images 07/01/2018-reviewed Medications: I have reviewed the patient's current medications.   Assessment/Plan: 1. Right lower quadrant massextensive liver metastases  CTs 04/22/2018-right lower quadrant mass associated with the ileum and cecum, hepatomegaly with extensive  liver metastases, multiple lung nodules consistent with metastases  Colonoscopy 04/24/2018- multiple benign-appearing polyps removed, no colon mass  04/24/2018 chromogranin A 350  04/24/2018 CEA 632  Biopsy liver lesion 04/25/2018- adenocarcinoma with extensive necrosis  Cycle 1 FOLFOX 04/28/2018  PET scan 05/10/2018- widespread bilateral hepatic metastasis.  Hypermetabolic right lower quadrant mass.  Hypermetabolic ileocolic mesenteric nodal metastasis.  Cycle 2 FOLFOX 05/12/2018 (5-FU dose reduced due to mucositis following cycle 1)  Cycle 3 FOLFOX 05/26/2018 (oxaliplatin dose reduced secondary to thrombocytopenia)  Cycle 4 FOLFOX 06/09/2018  Cycle 5 FOLFOX 06/23/2018  CT 07/01/2018- decreased tumor burden in the liver, decreased right abdomen mesenteric/omental implants  Cycle 6 FOLFOX 07/07/2018  2.Lower extremity edema secondary to massive hepatomegaly and hypoalbuminemia-resolved  3.Chronic back pain  4.Proximal leg weakness- deconditioning?, Spinal stenosis?-Improved  5.Mucositis, candidiasis 05/06/2018.  Resolved following a course of Diflucan.  Repeat course of Diflucan 07/07/2018   Disposition: Thomas Riley has a much improved performance status since beginning chemotherapy.  The restaging CTs confirmed a response to therapy.  The CEA is lower.  I reviewed the CT images.  We discussed treatment plans.  I recommend continuing FOLFOX.  He will complete cycle 6 today. Thomas Riley will complete a course of Diflucan for oral candidiasis.  He will return for an office visit and chemotherapy in 2 weeks.  25 minutes were spent with the patient today.  The majority of the time  was used for counseling and coordination of care.  Betsy Coder, MD  07/07/2018  11:37 AM

## 2018-07-09 ENCOUNTER — Inpatient Hospital Stay: Payer: 59

## 2018-07-09 ENCOUNTER — Other Ambulatory Visit: Payer: Self-pay

## 2018-07-09 VITALS — BP 111/59 | HR 96 | Temp 98.7°F | Resp 18

## 2018-07-09 DIAGNOSIS — C787 Secondary malignant neoplasm of liver and intrahepatic bile duct: Principal | ICD-10-CM

## 2018-07-09 DIAGNOSIS — C189 Malignant neoplasm of colon, unspecified: Secondary | ICD-10-CM

## 2018-07-09 DIAGNOSIS — Z5111 Encounter for antineoplastic chemotherapy: Secondary | ICD-10-CM | POA: Diagnosis not present

## 2018-07-09 MED ORDER — SODIUM CHLORIDE 0.9% FLUSH
10.0000 mL | INTRAVENOUS | Status: DC | PRN
  Administered 2018-07-09: 10 mL
  Filled 2018-07-09: qty 10

## 2018-07-09 MED ORDER — HEPARIN SOD (PORK) LOCK FLUSH 100 UNIT/ML IV SOLN
500.0000 [IU] | Freq: Once | INTRAVENOUS | Status: AC | PRN
  Administered 2018-07-09: 500 [IU]
  Filled 2018-07-09: qty 5

## 2018-07-09 NOTE — Patient Instructions (Signed)

## 2018-07-12 ENCOUNTER — Other Ambulatory Visit: Payer: Self-pay | Admitting: *Deleted

## 2018-07-12 DIAGNOSIS — C787 Secondary malignant neoplasm of liver and intrahepatic bile duct: Principal | ICD-10-CM

## 2018-07-12 DIAGNOSIS — C189 Malignant neoplasm of colon, unspecified: Secondary | ICD-10-CM

## 2018-07-12 MED ORDER — OMEPRAZOLE 20 MG PO CPDR: DELAYED_RELEASE_CAPSULE | ORAL | 0 refills | Status: DC

## 2018-07-14 ENCOUNTER — Telehealth: Payer: Self-pay

## 2018-07-14 ENCOUNTER — Telehealth: Payer: Self-pay | Admitting: Oncology

## 2018-07-14 ENCOUNTER — Other Ambulatory Visit: Payer: Self-pay | Admitting: Nurse Practitioner

## 2018-07-14 DIAGNOSIS — C787 Secondary malignant neoplasm of liver and intrahepatic bile duct: Secondary | ICD-10-CM

## 2018-07-14 MED ORDER — OXYCODONE-ACETAMINOPHEN 5-325 MG PO TABS: 1.0000 | ORAL_TABLET | ORAL | 0 refills | Status: DC | PRN

## 2018-07-14 NOTE — Telephone Encounter (Signed)
Faxed office notes for dates 06/22/18 to present. Release WH#[id number]

## 2018-07-14 NOTE — Telephone Encounter (Signed)
Received TC from pts wife stating that pt needed a refill on his oxycodone medication. Thomas Riley aware of request.

## 2018-07-17 ENCOUNTER — Other Ambulatory Visit: Payer: Self-pay | Admitting: Oncology

## 2018-07-21 ENCOUNTER — Inpatient Hospital Stay: Payer: 59

## 2018-07-21 ENCOUNTER — Telehealth: Payer: Self-pay | Admitting: Nurse Practitioner

## 2018-07-21 ENCOUNTER — Inpatient Hospital Stay (HOSPITAL_BASED_OUTPATIENT_CLINIC_OR_DEPARTMENT_OTHER): Payer: 59 | Admitting: Nurse Practitioner

## 2018-07-21 ENCOUNTER — Encounter: Payer: Self-pay | Admitting: Nurse Practitioner

## 2018-07-21 ENCOUNTER — Other Ambulatory Visit: Payer: Self-pay

## 2018-07-21 VITALS — BP 136/72 | HR 72 | Temp 97.9°F | Resp 17 | Ht 71.0 in | Wt 166.5 lb

## 2018-07-21 DIAGNOSIS — Z5111 Encounter for antineoplastic chemotherapy: Secondary | ICD-10-CM | POA: Diagnosis not present

## 2018-07-21 DIAGNOSIS — C787 Secondary malignant neoplasm of liver and intrahepatic bile duct: Secondary | ICD-10-CM | POA: Diagnosis not present

## 2018-07-21 DIAGNOSIS — C772 Secondary and unspecified malignant neoplasm of intra-abdominal lymph nodes: Secondary | ICD-10-CM

## 2018-07-21 DIAGNOSIS — C189 Malignant neoplasm of colon, unspecified: Secondary | ICD-10-CM

## 2018-07-21 DIAGNOSIS — C7800 Secondary malignant neoplasm of unspecified lung: Secondary | ICD-10-CM | POA: Diagnosis not present

## 2018-07-21 DIAGNOSIS — Z95828 Presence of other vascular implants and grafts: Secondary | ICD-10-CM

## 2018-07-21 DIAGNOSIS — C180 Malignant neoplasm of cecum: Secondary | ICD-10-CM

## 2018-07-21 DIAGNOSIS — D696 Thrombocytopenia, unspecified: Secondary | ICD-10-CM

## 2018-07-21 LAB — CMP (CANCER CENTER ONLY)
ALT: 10 U/L (ref 0–44)
AST: 33 U/L (ref 15–41)
Albumin: 3.4 g/dL — ABNORMAL LOW (ref 3.5–5.0)
Alkaline Phosphatase: 208 U/L — ABNORMAL HIGH (ref 38–126)
Anion gap: 13 (ref 5–15)
BUN: 10 mg/dL (ref 8–23)
CO2: 23 mmol/L (ref 22–32)
Calcium: 9.8 mg/dL (ref 8.9–10.3)
Chloride: 105 mmol/L (ref 98–111)
Creatinine: 0.78 mg/dL (ref 0.61–1.24)
GFR, Est AFR Am: >60 mL/min (ref >60)
GFR, Estimated: >60 mL/min (ref >60)
Glucose, Bld: 82 mg/dL (ref 70–99)
Potassium: 4 mmol/L (ref 3.5–5.1)
Sodium: 141 mmol/L (ref 135–145)
Total Bilirubin: 0.5 mg/dL (ref 0.3–1.2)
Total Protein: 8.2 g/dL — ABNORMAL HIGH (ref 6.5–8.1)

## 2018-07-21 LAB — CBC WITH DIFFERENTIAL (CANCER CENTER ONLY)
Abs Immature Granulocytes: 0.03 10*3/uL (ref 0.00–0.07)
Basophils Absolute: 0 10*3/uL (ref 0.0–0.1)
Basophils Relative: 1 %
Eosinophils Absolute: 0.1 10*3/uL (ref 0.0–0.5)
Eosinophils Relative: 1 %
HCT: 42.4 % (ref 39.0–52.0)
Hemoglobin: 13.4 g/dL (ref 13.0–17.0)
Immature Granulocytes: 0 %
Lymphocytes Relative: 19 %
Lymphs Abs: 1.7 10*3/uL (ref 0.7–4.0)
MCH: 30.6 pg (ref 26.0–34.0)
MCHC: 31.6 g/dL (ref 30.0–36.0)
MCV: 96.8 fL (ref 80.0–100.0)
Monocytes Absolute: 1.4 10*3/uL — ABNORMAL HIGH (ref 0.1–1.0)
Monocytes Relative: 17 %
Neutro Abs: 5.4 10*3/uL (ref 1.7–7.7)
Neutrophils Relative %: 62 %
Platelet Count: 96 10*3/uL — ABNORMAL LOW (ref 150–400)
RBC: 4.38 MIL/uL (ref 4.22–5.81)
RDW: 17.3 % — ABNORMAL HIGH (ref 11.5–15.5)
WBC Count: 8.6 10*3/uL (ref 4.0–10.5)
nRBC: 0 % (ref 0.0–0.2)

## 2018-07-21 LAB — CEA (IN HOUSE-CHCC): CEA (CHCC-In House): 415.35 ng/mL — ABNORMAL HIGH (ref 0.00–5.00)

## 2018-07-21 MED ORDER — DEXAMETHASONE SODIUM PHOSPHATE 10 MG/ML IJ SOLN
INTRAMUSCULAR | Status: AC
  Filled 2018-07-21: qty 1

## 2018-07-21 MED ORDER — PALONOSETRON HCL INJECTION 0.25 MG/5ML
INTRAVENOUS | Status: AC
  Filled 2018-07-21: qty 5

## 2018-07-21 MED ORDER — SODIUM CHLORIDE 0.9% FLUSH
10.0000 mL | INTRAVENOUS | Status: DC | PRN
  Administered 2018-07-21: 10:00:00 10 mL
  Filled 2018-07-21: qty 10

## 2018-07-21 MED ORDER — OXALIPLATIN CHEMO INJECTION 100 MG/20ML
65.0000 mg/m2 | Freq: Once | INTRAVENOUS | Status: AC
  Administered 2018-07-21: 12:00:00 125 mg via INTRAVENOUS
  Filled 2018-07-21: qty 10

## 2018-07-21 MED ORDER — DEXTROSE 5 % IV SOLN
Freq: Once | INTRAVENOUS | Status: AC
  Administered 2018-07-21: 11:00:00 via INTRAVENOUS
  Filled 2018-07-21: qty 250

## 2018-07-21 MED ORDER — PALONOSETRON HCL INJECTION 0.25 MG/5ML
0.2500 mg | Freq: Once | INTRAVENOUS | Status: AC
  Administered 2018-07-21: 12:00:00 0.25 mg via INTRAVENOUS

## 2018-07-21 MED ORDER — DEXAMETHASONE SODIUM PHOSPHATE 10 MG/ML IJ SOLN
10.0000 mg | Freq: Once | INTRAMUSCULAR | Status: AC
  Administered 2018-07-21: 10 mg via INTRAVENOUS

## 2018-07-21 MED ORDER — LEUCOVORIN CALCIUM INJECTION 350 MG
300.0000 mg/m2 | Freq: Once | INTRAVENOUS | Status: AC
  Administered 2018-07-21: 13:00:00 588 mg via INTRAVENOUS
  Filled 2018-07-21: qty 29.4

## 2018-07-21 MED ORDER — SODIUM CHLORIDE 0.9 % IV SOLN
1800.0000 mg/m2 | INTRAVENOUS | Status: DC
  Administered 2018-07-21: 3550 mg via INTRAVENOUS
  Filled 2018-07-21: qty 71

## 2018-07-21 NOTE — Progress Notes (Signed)
Per [name] NP office note dated 07/21/2018 "We reviewed the CBC from today.  He has mild stablWe reviewed the CBC from today.  He has mild stable thrombocytopenia.  Counts are adequate for treatmente thrombocytopenia.  Counts are adequate for treatment"

## 2018-07-21 NOTE — Telephone Encounter (Signed)
Scheduled appt per 4/30 los.  Per 4/30 los [facility] did not have any availability.

## 2018-07-21 NOTE — Progress Notes (Signed)
  [place] OFFICE PROGRESS NOTE   Diagnosis: Colon cancer  INTERVAL HISTORY:   Mr. Creeden returns as scheduled.  He completed cycle 6 FOLFOX 07/07/2018.  He denies nausea/vomiting.  No mouth sores.  No diarrhea.  Cold sensitivity typically last a few days after each treatment.  No persistent neuropathy symptoms.  He denies bleeding.  He reports no significant abdominal pain during the daytime.  The pain mainly occurs at night and early in the morning hours.  Objective:  Vital signs in last 24 hours:  Blood pressure 136/72, pulse 72, temperature 97.9 F (36.6 C), temperature source Oral, resp. rate 17, height 5\' 11"  (1.803 m), weight 166 lb 8 oz (75.5 kg), SpO2 97 %.    HEENT: Mild white coating over tongue.  No buccal thrush. GI: Liver palpable right upper abdomen. Vascular: No leg edema. Neuro: Vibratory sense intact over the fingertips per tuning fork exam. Skin: Palms with mild erythema. Port-A-Cath without erythema.  Lab Results:  Lab Results  Component Value Date   WBC 8.6 07/21/2018   HGB 13.4 07/21/2018   HCT 42.4 07/21/2018   MCV 96.8 07/21/2018   PLT 96 (L) 07/21/2018   NEUTROABS 5.4 07/21/2018    Imaging:  No results found.  Medications: I have reviewed the patient's current medications.  Assessment/Plan: 1. Right lower quadrant massextensive liver metastases  CTs 04/22/2018-right lower quadrant mass associated with the ileum and cecum, hepatomegaly with extensive liver metastases, multiple lung nodules consistent with metastases  Colonoscopy 04/24/2018- multiple benign-appearing polyps removed, no colon mass  04/24/2018 chromogranin A 350  04/24/2018 CEA 632  Biopsy liver lesion 04/25/2018- adenocarcinoma with extensive necrosis  Cycle 1 FOLFOX 04/28/2018  PET scan 05/10/2018- widespread bilateral hepatic metastasis. Hypermetabolic right lower quadrant mass. Hypermetabolic ileocolic mesenteric nodal metastasis.  Cycle 2 FOLFOX 05/12/2018  (5-FU dose reduced due to mucositis following cycle 1)  Cycle 3 FOLFOX 05/26/2018 (oxaliplatin dose reduced secondary to thrombocytopenia)  Cycle 4 FOLFOX 06/09/2018  Cycle 5 FOLFOX 06/23/2018  CT 07/01/2018- decreased tumor burden in the liver, decreased right abdomen mesenteric/omental implants  Cycle 6 FOLFOX 07/07/2018  Cycle 7 FOLFOX 07/21/2018  2.Lower extremity edema secondary to massive hepatomegaly and hypoalbuminemia-resolved  3.Chronic back pain  4.Proximal leg weakness- deconditioning?, Spinal stenosis?-Improved  5.Mucositis, candidiasis 05/06/2018. Resolved following a course of Diflucan.  Repeat course of Diflucan 07/07/2018   Disposition: Mr. Bergfeld appears stable.  He has completed 6 cycles of FOLFOX.  Plan to proceed with cycle 7 today as scheduled.  We reviewed the CBC from today.  He has mild stable thrombocytopenia.  Counts are adequate for treatment.  He will return for lab, follow-up and cycle 8 FOLFOX in 2 weeks.  He will contact the office in the interim with any problems.  [name] ANP/GNP-BC   07/21/2018  11:02 AM

## 2018-07-23 ENCOUNTER — Inpatient Hospital Stay: Payer: 59 | Attending: Oncology

## 2018-07-23 ENCOUNTER — Other Ambulatory Visit: Payer: Self-pay

## 2018-07-23 VITALS — BP 124/81 | HR 91 | Temp 98.4°F | Resp 18

## 2018-07-23 DIAGNOSIS — G620 Drug-induced polyneuropathy: Secondary | ICD-10-CM | POA: Diagnosis not present

## 2018-07-23 DIAGNOSIS — T451X5S Adverse effect of antineoplastic and immunosuppressive drugs, sequela: Secondary | ICD-10-CM | POA: Insufficient documentation

## 2018-07-23 DIAGNOSIS — R531 Weakness: Secondary | ICD-10-CM | POA: Diagnosis not present

## 2018-07-23 DIAGNOSIS — R600 Localized edema: Secondary | ICD-10-CM | POA: Diagnosis not present

## 2018-07-23 DIAGNOSIS — K1230 Oral mucositis (ulcerative), unspecified: Secondary | ICD-10-CM | POA: Diagnosis not present

## 2018-07-23 DIAGNOSIS — D6959 Other secondary thrombocytopenia: Secondary | ICD-10-CM | POA: Diagnosis not present

## 2018-07-23 DIAGNOSIS — C787 Secondary malignant neoplasm of liver and intrahepatic bile duct: Secondary | ICD-10-CM | POA: Insufficient documentation

## 2018-07-23 DIAGNOSIS — Z5111 Encounter for antineoplastic chemotherapy: Secondary | ICD-10-CM | POA: Insufficient documentation

## 2018-07-23 DIAGNOSIS — C180 Malignant neoplasm of cecum: Secondary | ICD-10-CM | POA: Diagnosis present

## 2018-07-23 DIAGNOSIS — C189 Malignant neoplasm of colon, unspecified: Secondary | ICD-10-CM

## 2018-07-23 DIAGNOSIS — Z79899 Other long term (current) drug therapy: Secondary | ICD-10-CM | POA: Diagnosis not present

## 2018-07-23 DIAGNOSIS — C7800 Secondary malignant neoplasm of unspecified lung: Secondary | ICD-10-CM | POA: Insufficient documentation

## 2018-07-23 DIAGNOSIS — G8929 Other chronic pain: Secondary | ICD-10-CM | POA: Diagnosis not present

## 2018-07-23 DIAGNOSIS — M549 Dorsalgia, unspecified: Secondary | ICD-10-CM | POA: Diagnosis not present

## 2018-07-23 DIAGNOSIS — R109 Unspecified abdominal pain: Secondary | ICD-10-CM | POA: Insufficient documentation

## 2018-07-23 MED ORDER — HEPARIN SOD (PORK) LOCK FLUSH 100 UNIT/ML IV SOLN
500.0000 [IU] | Freq: Once | INTRAVENOUS | Status: AC | PRN
  Administered 2018-07-23: 500 [IU]
  Filled 2018-07-23: qty 5

## 2018-07-23 MED ORDER — SODIUM CHLORIDE 0.9% FLUSH
10.0000 mL | INTRAVENOUS | Status: DC | PRN
  Administered 2018-07-23: 10 mL
  Filled 2018-07-23: qty 10

## 2018-07-27 ENCOUNTER — Telehealth: Payer: Self-pay | Admitting: Oncology

## 2018-07-27 NOTE — Telephone Encounter (Signed)
Returned patient's phone call regarding rescheduling an appointment, patient's wife confirmed date and time instead.

## 2018-07-31 ENCOUNTER — Other Ambulatory Visit: Payer: Self-pay | Admitting: Oncology

## 2018-08-02 ENCOUNTER — Other Ambulatory Visit: Payer: Self-pay | Admitting: Nurse Practitioner

## 2018-08-02 DIAGNOSIS — C787 Secondary malignant neoplasm of liver and intrahepatic bile duct: Secondary | ICD-10-CM

## 2018-08-02 MED ORDER — OXYCODONE-ACETAMINOPHEN 5-325 MG PO TABS: 1.0000 | ORAL_TABLET | ORAL | 0 refills | Status: DC | PRN

## 2018-08-04 ENCOUNTER — Telehealth: Payer: Self-pay | Admitting: Oncology

## 2018-08-04 ENCOUNTER — Inpatient Hospital Stay (HOSPITAL_BASED_OUTPATIENT_CLINIC_OR_DEPARTMENT_OTHER): Payer: 59 | Admitting: Oncology

## 2018-08-04 ENCOUNTER — Encounter: Payer: Self-pay | Admitting: *Deleted

## 2018-08-04 ENCOUNTER — Inpatient Hospital Stay: Payer: 59

## 2018-08-04 ENCOUNTER — Other Ambulatory Visit: Payer: Self-pay

## 2018-08-04 VITALS — BP 130/76 | HR 92 | Temp 98.5°F | Resp 18 | Ht 71.0 in | Wt 164.5 lb

## 2018-08-04 DIAGNOSIS — C7800 Secondary malignant neoplasm of unspecified lung: Secondary | ICD-10-CM

## 2018-08-04 DIAGNOSIS — C787 Secondary malignant neoplasm of liver and intrahepatic bile duct: Secondary | ICD-10-CM

## 2018-08-04 DIAGNOSIS — R600 Localized edema: Secondary | ICD-10-CM

## 2018-08-04 DIAGNOSIS — M549 Dorsalgia, unspecified: Secondary | ICD-10-CM

## 2018-08-04 DIAGNOSIS — Z79899 Other long term (current) drug therapy: Secondary | ICD-10-CM

## 2018-08-04 DIAGNOSIS — R109 Unspecified abdominal pain: Secondary | ICD-10-CM

## 2018-08-04 DIAGNOSIS — C180 Malignant neoplasm of cecum: Secondary | ICD-10-CM | POA: Diagnosis not present

## 2018-08-04 DIAGNOSIS — D6959 Other secondary thrombocytopenia: Secondary | ICD-10-CM | POA: Diagnosis not present

## 2018-08-04 DIAGNOSIS — R531 Weakness: Secondary | ICD-10-CM

## 2018-08-04 DIAGNOSIS — Z95828 Presence of other vascular implants and grafts: Secondary | ICD-10-CM

## 2018-08-04 DIAGNOSIS — T451X5S Adverse effect of antineoplastic and immunosuppressive drugs, sequela: Secondary | ICD-10-CM

## 2018-08-04 DIAGNOSIS — C189 Malignant neoplasm of colon, unspecified: Secondary | ICD-10-CM

## 2018-08-04 DIAGNOSIS — K1230 Oral mucositis (ulcerative), unspecified: Secondary | ICD-10-CM

## 2018-08-04 DIAGNOSIS — G8929 Other chronic pain: Secondary | ICD-10-CM

## 2018-08-04 LAB — CEA (IN HOUSE-CHCC): CEA (CHCC-In House): 484.37 ng/mL — ABNORMAL HIGH (ref 0.00–5.00)

## 2018-08-04 LAB — CBC WITH DIFFERENTIAL (CANCER CENTER ONLY)
Abs Immature Granulocytes: 0.02 10*3/uL (ref 0.00–0.07)
Basophils Absolute: 0 10*3/uL (ref 0.0–0.1)
Basophils Relative: 0 %
Eosinophils Absolute: 0.1 10*3/uL (ref 0.0–0.5)
Eosinophils Relative: 1 %
HCT: 41.2 % (ref 39.0–52.0)
Hemoglobin: 13.2 g/dL (ref 13.0–17.0)
Immature Granulocytes: 0 %
Lymphocytes Relative: 18 %
Lymphs Abs: 1.5 10*3/uL (ref 0.7–4.0)
MCH: 31.1 pg (ref 26.0–34.0)
MCHC: 32 g/dL (ref 30.0–36.0)
MCV: 97.2 fL (ref 80.0–100.0)
Monocytes Absolute: 1.3 10*3/uL — ABNORMAL HIGH (ref 0.1–1.0)
Monocytes Relative: 15 %
Neutro Abs: 5.4 10*3/uL (ref 1.7–7.7)
Neutrophils Relative %: 66 %
Platelet Count: 87 10*3/uL — ABNORMAL LOW (ref 150–400)
RBC: 4.24 MIL/uL (ref 4.22–5.81)
RDW: 17.2 % — ABNORMAL HIGH (ref 11.5–15.5)
WBC Count: 8.3 10*3/uL (ref 4.0–10.5)
nRBC: 0 % (ref 0.0–0.2)

## 2018-08-04 LAB — CMP (CANCER CENTER ONLY)
ALT: 13 U/L (ref 0–44)
AST: 38 U/L (ref 15–41)
Albumin: 3.1 g/dL — ABNORMAL LOW (ref 3.5–5.0)
Alkaline Phosphatase: 201 U/L — ABNORMAL HIGH (ref 38–126)
Anion gap: 11 (ref 5–15)
BUN: 10 mg/dL (ref 8–23)
CO2: 23 mmol/L (ref 22–32)
Calcium: 9.7 mg/dL (ref 8.9–10.3)
Chloride: 105 mmol/L (ref 98–111)
Creatinine: 0.79 mg/dL (ref 0.61–1.24)
GFR, Est AFR Am: >60 mL/min (ref >60)
GFR, Estimated: >60 mL/min (ref >60)
Glucose, Bld: 133 mg/dL — ABNORMAL HIGH (ref 70–99)
Potassium: 3.7 mmol/L (ref 3.5–5.1)
Sodium: 139 mmol/L (ref 135–145)
Total Bilirubin: 0.6 mg/dL (ref 0.3–1.2)
Total Protein: 7.9 g/dL (ref 6.5–8.1)

## 2018-08-04 MED ORDER — LEUCOVORIN CALCIUM INJECTION 350 MG
300.0000 mg/m2 | Freq: Once | INTRAVENOUS | Status: AC
  Administered 2018-08-04: 11:00:00 588 mg via INTRAVENOUS
  Filled 2018-08-04: qty 29.4

## 2018-08-04 MED ORDER — SODIUM CHLORIDE 0.9 % IV SOLN
1800.0000 mg/m2 | INTRAVENOUS | Status: DC
  Administered 2018-08-04: 13:00:00 3550 mg via INTRAVENOUS
  Filled 2018-08-04: qty 71

## 2018-08-04 MED ORDER — SODIUM CHLORIDE 0.9% FLUSH
10.0000 mL | INTRAVENOUS | Status: DC | PRN
  Administered 2018-08-04: 10 mL
  Filled 2018-08-04: qty 10

## 2018-08-04 MED ORDER — DEXAMETHASONE SODIUM PHOSPHATE 10 MG/ML IJ SOLN
INTRAMUSCULAR | Status: AC
  Filled 2018-08-04: qty 1

## 2018-08-04 MED ORDER — OXALIPLATIN CHEMO INJECTION 100 MG/20ML
65.0000 mg/m2 | Freq: Once | INTRAVENOUS | Status: AC
  Administered 2018-08-04: 11:00:00 125 mg via INTRAVENOUS
  Filled 2018-08-04: qty 25

## 2018-08-04 MED ORDER — PALONOSETRON HCL INJECTION 0.25 MG/5ML
0.2500 mg | Freq: Once | INTRAVENOUS | Status: AC
  Administered 2018-08-04: 10:00:00 0.25 mg via INTRAVENOUS

## 2018-08-04 MED ORDER — PALONOSETRON HCL INJECTION 0.25 MG/5ML
INTRAVENOUS | Status: AC
  Filled 2018-08-04: qty 5

## 2018-08-04 MED ORDER — DEXAMETHASONE SODIUM PHOSPHATE 10 MG/ML IJ SOLN
10.0000 mg | Freq: Once | INTRAMUSCULAR | Status: AC
  Administered 2018-08-04: 10 mg via INTRAVENOUS

## 2018-08-04 MED ORDER — DEXTROSE 5 % IV SOLN
Freq: Once | INTRAVENOUS | Status: AC
  Administered 2018-08-04: 10:00:00 via INTRAVENOUS
  Filled 2018-08-04: qty 250

## 2018-08-04 NOTE — Telephone Encounter (Signed)
Scheduled appt per 5/14 los. ° °A calendar will be mailed out. °

## 2018-08-04 NOTE — Progress Notes (Signed)
[place] OFFICE PROGRESS NOTE   Diagnosis: Metastatic adenocarcinoma  INTERVAL HISTORY:   [name] returns for a scheduled visit.  He completed another cycle of FOLFOX on 07/21/2018.  He has a white coating over the tongue.  No mouth sores, nausea, or diarrhea.  He has cold sensitivity while the chemotherapy pump is on.  This resolves after the pump disconnect.  No other neuropathy symptoms.  He continues to have intermittent abdominal pain.  He takes oxycodone in the morning and evening.  Objective:  Vital signs in last 24 hours:  Blood pressure 130/76, pulse 92, temperature 98.5 F (36.9 C), temperature source Oral, resp. rate 18, height 5\' 11"  (1.803 m), weight 164 lb 8 oz (74.6 kg), SpO2 97 %.    HEENT: White coat over the tongue, no buccal thrush or ulcers Resp: Lungs clear bilaterally Cardio: Regular rate and rhythm GI: The liver is palpable in the right upper abdomen, nontender Vascular: No leg edema Neuro: Mild loss of vibratory sense at the fingertips bilaterally   Portacath/PICC-without erythema  Lab Results:  Lab Results  Component Value Date   WBC 8.3 08/04/2018   HGB 13.2 08/04/2018   HCT 41.2 08/04/2018   MCV 97.2 08/04/2018   PLT 87 (L) 08/04/2018   NEUTROABS 5.4 08/04/2018    CMP  Lab Results  Component Value Date   NA 141 07/21/2018   K 4.0 07/21/2018   CL 105 07/21/2018   CO2 23 07/21/2018   GLUCOSE 82 07/21/2018   BUN 10 07/21/2018   CREATININE 0.78 07/21/2018   CALCIUM 9.8 07/21/2018   PROT 8.2 (H) 07/21/2018   ALBUMIN 3.4 (L) 07/21/2018   AST 33 07/21/2018   ALT 10 07/21/2018   ALKPHOS 208 (H) 07/21/2018   BILITOT 0.5 07/21/2018   GFRNONAA >60 07/21/2018   GFRAA >60 07/21/2018    Lab Results  Component Value Date   CEA1 415.35 (H) 07/21/2018     Medications: I have reviewed the patient's current medications.   Assessment/Plan: 1. Right lower quadrant massextensive liver metastases  CTs  04/22/2018-right lower quadrant mass associated with the ileum and cecum, hepatomegaly with extensive liver metastases, multiple lung nodules consistent with metastases  Colonoscopy 04/24/2018- multiple benign-appearing polyps removed, no colon mass  04/24/2018 chromogranin A 350  04/24/2018 CEA 632  Biopsy liver lesion 04/25/2018- adenocarcinoma with extensive necrosis  Cycle 1 FOLFOX 04/28/2018  PET scan 05/10/2018- widespread bilateral hepatic metastasis. Hypermetabolic right lower quadrant mass. Hypermetabolic ileocolic mesenteric nodal metastasis.  Cycle 2 FOLFOX 05/12/2018 (5-FU dose reduced due to mucositis following cycle 1)  Cycle 3 FOLFOX 05/26/2018 (oxaliplatin dose reduced secondary to thrombocytopenia)  Cycle 4 FOLFOX 06/09/2018  Cycle 5 FOLFOX 06/23/2018  CT 07/01/2018- decreased tumor burden in the liver, decreased right abdomen mesenteric/omental implants  Cycle 6 FOLFOX 07/07/2018  Cycle 7 FOLFOX 07/21/2018  Cycle 8 FOLFOX 08/04/2018  2.Lower extremity edema secondary to massive hepatomegaly and hypoalbuminemia-resolved  3.Chronic back pain  4.Proximal leg weakness- deconditioning?, Spinal stenosis?-Improved  5.Mucositis, candidiasis 05/06/2018. Resolved following a course of Diflucan.  Repeat course of Diflucan 07/07/2018     Disposition: [name] appears unchanged.  He will complete another cycle of FOLFOX today.  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  He has mild thrombocytopenia secondary to chemotherapy.  He will call for bleeding or bruising.  [name] will return for an office visit and chemotherapy in 2 weeks.  Plan is to complete 5 cycles of FOLFOX between restaging CTs.  We will submit the liver biopsy pathology for Foundation 1 testing.  Betsy Coder, MD  08/04/2018  9:07 AM

## 2018-08-04 NOTE — Patient Instructions (Signed)

## 2018-08-04 NOTE — Telephone Encounter (Signed)
Scheduled appt per 5/14 los.  A

## 2018-08-04 NOTE — Progress Notes (Signed)
At request of Dr. Benay Spice, called Suanne Marker at [facility] to request Foundation One testing on case #[id number].  Dx: C18.9 Stage IV Colon

## 2018-08-04 NOTE — Progress Notes (Signed)
MD review of labs today. Per Dr. Benay Spice: OK to treat with platelet count 87,000

## 2018-08-04 NOTE — Patient Instructions (Signed)
[facility] Discharge Instructions for Patients Receiving Chemotherapy  Today you received the following chemotherapy agents :  Oxaliplatin,  Leucovorin,  Fluorouracil.  To help prevent nausea and vomiting after your treatment, we encourage you to take your nausea medication as prescribed.   If you develop nausea and vomiting that is not controlled by your nausea medication, call the clinic.   BELOW ARE SYMPTOMS THAT SHOULD BE REPORTED IMMEDIATELY:  *FEVER GREATER THAN 100.5 F  *CHILLS WITH OR WITHOUT FEVER  NAUSEA AND VOMITING THAT IS NOT CONTROLLED WITH YOUR NAUSEA MEDICATION  *UNUSUAL SHORTNESS OF BREATH  *UNUSUAL BRUISING OR BLEEDING  TENDERNESS IN MOUTH AND THROAT WITH OR WITHOUT PRESENCE OF ULCERS  *URINARY PROBLEMS  *BOWEL PROBLEMS  UNUSUAL RASH Items with * indicate a potential emergency and should be followed up as soon as possible.  Feel free to call the clinic should you have any questions or concerns. The clinic phone number is [phone].  Please show the CHEMO ALERT CARD at check-in to the Emergency Department and triage nurse.   

## 2018-08-06 ENCOUNTER — Other Ambulatory Visit: Payer: Self-pay

## 2018-08-06 ENCOUNTER — Inpatient Hospital Stay: Payer: 59

## 2018-08-06 VITALS — BP 133/63 | HR 98 | Temp 98.2°F | Resp 18

## 2018-08-06 DIAGNOSIS — C180 Malignant neoplasm of cecum: Secondary | ICD-10-CM | POA: Diagnosis not present

## 2018-08-06 DIAGNOSIS — C189 Malignant neoplasm of colon, unspecified: Secondary | ICD-10-CM

## 2018-08-06 MED ORDER — SODIUM CHLORIDE 0.9% FLUSH
10.0000 mL | INTRAVENOUS | Status: DC | PRN
  Administered 2018-08-06: 10 mL
  Filled 2018-08-06: qty 10

## 2018-08-06 MED ORDER — HEPARIN SOD (PORK) LOCK FLUSH 100 UNIT/ML IV SOLN
500.0000 [IU] | Freq: Once | INTRAVENOUS | Status: AC | PRN
  Administered 2018-08-06: 500 [IU]
  Filled 2018-08-06: qty 5

## 2018-08-10 ENCOUNTER — Telehealth: Payer: Self-pay | Admitting: *Deleted

## 2018-08-10 NOTE — Telephone Encounter (Signed)
MR Faxed to [facility] [phone]

## 2018-08-15 ENCOUNTER — Other Ambulatory Visit: Payer: Self-pay | Admitting: Oncology

## 2018-08-18 ENCOUNTER — Encounter: Payer: Self-pay | Admitting: Nurse Practitioner

## 2018-08-18 ENCOUNTER — Inpatient Hospital Stay: Payer: 59

## 2018-08-18 ENCOUNTER — Other Ambulatory Visit: Payer: Self-pay

## 2018-08-18 ENCOUNTER — Inpatient Hospital Stay (HOSPITAL_BASED_OUTPATIENT_CLINIC_OR_DEPARTMENT_OTHER): Payer: 59 | Admitting: Nurse Practitioner

## 2018-08-18 VITALS — BP 142/76 | HR 96 | Temp 98.9°F | Resp 18 | Ht 71.0 in | Wt 161.9 lb

## 2018-08-18 DIAGNOSIS — K1230 Oral mucositis (ulcerative), unspecified: Secondary | ICD-10-CM

## 2018-08-18 DIAGNOSIS — C180 Malignant neoplasm of cecum: Secondary | ICD-10-CM

## 2018-08-18 DIAGNOSIS — C189 Malignant neoplasm of colon, unspecified: Secondary | ICD-10-CM

## 2018-08-18 DIAGNOSIS — C787 Secondary malignant neoplasm of liver and intrahepatic bile duct: Secondary | ICD-10-CM | POA: Diagnosis not present

## 2018-08-18 DIAGNOSIS — D6959 Other secondary thrombocytopenia: Secondary | ICD-10-CM

## 2018-08-18 DIAGNOSIS — C7800 Secondary malignant neoplasm of unspecified lung: Secondary | ICD-10-CM | POA: Diagnosis not present

## 2018-08-18 DIAGNOSIS — Z79899 Other long term (current) drug therapy: Secondary | ICD-10-CM

## 2018-08-18 DIAGNOSIS — T451X5S Adverse effect of antineoplastic and immunosuppressive drugs, sequela: Secondary | ICD-10-CM

## 2018-08-18 DIAGNOSIS — G620 Drug-induced polyneuropathy: Secondary | ICD-10-CM

## 2018-08-18 DIAGNOSIS — Z95828 Presence of other vascular implants and grafts: Secondary | ICD-10-CM

## 2018-08-18 LAB — CBC WITH DIFFERENTIAL (CANCER CENTER ONLY)
Abs Immature Granulocytes: 0.03 10*3/uL (ref 0.00–0.07)
Basophils Absolute: 0 10*3/uL (ref 0.0–0.1)
Basophils Relative: 0 %
Eosinophils Absolute: 0.1 10*3/uL (ref 0.0–0.5)
Eosinophils Relative: 1 %
HCT: 41.5 % (ref 39.0–52.0)
Hemoglobin: 13.2 g/dL (ref 13.0–17.0)
Immature Granulocytes: 0 %
Lymphocytes Relative: 16 %
Lymphs Abs: 1.5 10*3/uL (ref 0.7–4.0)
MCH: 30.7 pg (ref 26.0–34.0)
MCHC: 31.8 g/dL (ref 30.0–36.0)
MCV: 96.5 fL (ref 80.0–100.0)
Monocytes Absolute: 1.3 10*3/uL — ABNORMAL HIGH (ref 0.1–1.0)
Monocytes Relative: 14 %
Neutro Abs: 6.5 10*3/uL (ref 1.7–7.7)
Neutrophils Relative %: 69 %
Platelet Count: 93 10*3/uL — ABNORMAL LOW (ref 150–400)
RBC: 4.3 MIL/uL (ref 4.22–5.81)
RDW: 17.2 % — ABNORMAL HIGH (ref 11.5–15.5)
WBC Count: 9.5 10*3/uL (ref 4.0–10.5)
nRBC: 0 % (ref 0.0–0.2)

## 2018-08-18 LAB — CMP (CANCER CENTER ONLY)
ALT: 11 U/L (ref 0–44)
AST: 40 U/L (ref 15–41)
Albumin: 3.1 g/dL — ABNORMAL LOW (ref 3.5–5.0)
Alkaline Phosphatase: 219 U/L — ABNORMAL HIGH (ref 38–126)
Anion gap: 9 (ref 5–15)
BUN: 13 mg/dL (ref 8–23)
CO2: 25 mmol/L (ref 22–32)
Calcium: 9.9 mg/dL (ref 8.9–10.3)
Chloride: 104 mmol/L (ref 98–111)
Creatinine: 0.75 mg/dL (ref 0.61–1.24)
GFR, Est AFR Am: >60 mL/min (ref >60)
GFR, Estimated: >60 mL/min (ref >60)
Glucose, Bld: 92 mg/dL (ref 70–99)
Potassium: 3.8 mmol/L (ref 3.5–5.1)
Sodium: 138 mmol/L (ref 135–145)
Total Bilirubin: 0.6 mg/dL (ref 0.3–1.2)
Total Protein: 8.5 g/dL — ABNORMAL HIGH (ref 6.5–8.1)

## 2018-08-18 LAB — CEA (IN HOUSE-CHCC): CEA (CHCC-In House): 557.96 ng/mL — ABNORMAL HIGH (ref 0.00–5.00)

## 2018-08-18 MED ORDER — OXYCODONE-ACETAMINOPHEN 5-325 MG PO TABS: 1.0000 | ORAL_TABLET | ORAL | 0 refills | Status: DC | PRN

## 2018-08-18 MED ORDER — SODIUM CHLORIDE 0.9 % IV SOLN
1800.0000 mg/m2 | INTRAVENOUS | Status: DC
  Administered 2018-08-18: 3550 mg via INTRAVENOUS
  Filled 2018-08-18: qty 71

## 2018-08-18 MED ORDER — PALONOSETRON HCL INJECTION 0.25 MG/5ML
INTRAVENOUS | Status: AC
  Filled 2018-08-18: qty 5

## 2018-08-18 MED ORDER — DEXAMETHASONE SODIUM PHOSPHATE 10 MG/ML IJ SOLN
10.0000 mg | Freq: Once | INTRAMUSCULAR | Status: AC
  Administered 2018-08-18: 10 mg via INTRAVENOUS

## 2018-08-18 MED ORDER — SODIUM CHLORIDE 0.9% FLUSH
10.0000 mL | INTRAVENOUS | Status: DC | PRN
  Administered 2018-08-18: 10:00:00 10 mL
  Filled 2018-08-18: qty 10

## 2018-08-18 MED ORDER — LEUCOVORIN CALCIUM INJECTION 350 MG
300.0000 mg/m2 | Freq: Once | INTRAVENOUS | Status: AC
  Administered 2018-08-18: 588 mg via INTRAVENOUS
  Filled 2018-08-18: qty 29.4

## 2018-08-18 MED ORDER — OXALIPLATIN CHEMO INJECTION 100 MG/20ML
65.0000 mg/m2 | Freq: Once | INTRAVENOUS | Status: AC
  Administered 2018-08-18: 125 mg via INTRAVENOUS
  Filled 2018-08-18: qty 10

## 2018-08-18 MED ORDER — DEXTROSE 5 % IV SOLN
Freq: Once | INTRAVENOUS | Status: AC
  Administered 2018-08-18: 12:00:00 via INTRAVENOUS
  Filled 2018-08-18: qty 250

## 2018-08-18 MED ORDER — DEXTROSE 5 % IV SOLN
Freq: Once | INTRAVENOUS | Status: AC
  Administered 2018-08-18: 11:00:00 via INTRAVENOUS
  Filled 2018-08-18: qty 250

## 2018-08-18 MED ORDER — PALONOSETRON HCL INJECTION 0.25 MG/5ML
0.2500 mg | Freq: Once | INTRAVENOUS | Status: AC
  Administered 2018-08-18: 12:00:00 0.25 mg via INTRAVENOUS

## 2018-08-18 MED ORDER — HEPARIN SOD (PORK) LOCK FLUSH 100 UNIT/ML IV SOLN
500.0000 [IU] | Freq: Once | INTRAVENOUS | Status: DC | PRN
  Filled 2018-08-18: qty 5

## 2018-08-18 MED ORDER — DEXAMETHASONE SODIUM PHOSPHATE 10 MG/ML IJ SOLN
INTRAMUSCULAR | Status: AC
  Filled 2018-08-18: qty 1

## 2018-08-18 MED ORDER — SODIUM CHLORIDE 0.9% FLUSH
10.0000 mL | INTRAVENOUS | Status: DC | PRN
  Filled 2018-08-18: qty 10

## 2018-08-18 NOTE — Patient Instructions (Signed)
[facility] Discharge Instructions for Patients Receiving Chemotherapy  Today you received the following chemotherapy agents :  Oxaliplatin,  Leucovorin,  Fluorouracil.  To help prevent nausea and vomiting after your treatment, we encourage you to take your nausea medication as prescribed.   If you develop nausea and vomiting that is not controlled by your nausea medication, call the clinic.   BELOW ARE SYMPTOMS THAT SHOULD BE REPORTED IMMEDIATELY:  *FEVER GREATER THAN 100.5 F  *CHILLS WITH OR WITHOUT FEVER  NAUSEA AND VOMITING THAT IS NOT CONTROLLED WITH YOUR NAUSEA MEDICATION  *UNUSUAL SHORTNESS OF BREATH  *UNUSUAL BRUISING OR BLEEDING  TENDERNESS IN MOUTH AND THROAT WITH OR WITHOUT PRESENCE OF ULCERS  *URINARY PROBLEMS  *BOWEL PROBLEMS  UNUSUAL RASH Items with * indicate a potential emergency and should be followed up as soon as possible.  Feel free to call the clinic should you have any questions or concerns. The clinic phone number is [phone].  Please show the CHEMO ALERT CARD at check-in to the Emergency Department and triage nurse.   

## 2018-08-18 NOTE — Progress Notes (Signed)
  [place] OFFICE PROGRESS NOTE   Diagnosis: Metastatic adenocarcinoma  INTERVAL HISTORY:   Thomas Riley returns as scheduled.  He completed cycle 8 FOLFOX 08/04/2018.  He denies nausea/vomiting.  No mouth sores.  No diarrhea.  Cold sensitivity lasted about 3 days.  No persistent neuropathy symptoms.  He denies bleeding.  He did yard work over the weekend with his son.  The following morning he had back pain.  The pain is better.  Objective:  Vital signs in last 24 hours:  Blood pressure (!) 142/76, pulse 96, temperature 98.9 F (37.2 C), temperature source Oral, resp. rate 18, height 5\' 11"  (1.803 m), weight 161 lb 14.4 oz (73.4 kg), SpO2 99 %.    HEENT: Mild white coating over tongue.  No ulcers. Vascular: No leg edema. Neuro: Vibratory sense intact over the fingertips per tuning fork exam. Skin: Palms without erythema. Port-A-Cath without erythema.   Lab Results:  Lab Results  Component Value Date   WBC 9.5 08/18/2018   HGB 13.2 08/18/2018   HCT 41.5 08/18/2018   MCV 96.5 08/18/2018   PLT 93 (L) 08/18/2018   NEUTROABS 6.5 08/18/2018    Imaging:  No results found.  Medications: I have reviewed the patient's current medications.  Assessment/Plan: 1. Right lower quadrant massextensive liver metastases  CTs 04/22/2018-right lower quadrant mass associated with the ileum and cecum, hepatomegaly with extensive liver metastases, multiple lung nodules consistent with metastases  Colonoscopy 04/24/2018- multiple benign-appearing polyps removed, no colon mass  04/24/2018 chromogranin A 350  04/24/2018 CEA 632  Biopsy liver lesion 04/25/2018- adenocarcinoma with extensive necrosis  Cycle 1 FOLFOX 04/28/2018  PET scan 05/10/2018- widespread bilateral hepatic metastasis. Hypermetabolic right lower quadrant mass. Hypermetabolic ileocolic mesenteric nodal metastasis.  Cycle 2 FOLFOX 05/12/2018 (5-FU dose reduced due to mucositis following cycle 1)  Cycle 3  FOLFOX 05/26/2018 (oxaliplatin dose reduced secondary to thrombocytopenia)  Cycle 4 FOLFOX 06/09/2018  Cycle 5 FOLFOX 06/23/2018  CT 07/01/2018- decreased tumor burden in the liver, decreased right abdomen mesenteric/omental implants  Cycle 6 FOLFOX 07/07/2018  Cycle 7 FOLFOX 07/21/2018  Cycle 8 FOLFOX 08/04/2018  Cycle 9 FOLFOX 08/18/2018  2.Lower extremity edema secondary to massive hepatomegaly and hypoalbuminemia-resolved  3.Chronic back pain  4.Proximal leg weakness- deconditioning?, Spinal stenosis?-Improved  5.Mucositis, candidiasis 05/06/2018. Resolved following a course of Diflucan.Repeat course of Diflucan 07/07/2018  Disposition: Thomas Riley appears stable.  He has completed 8 cycles of FOLFOX.  Plan to proceed with cycle 9 today as scheduled.  We reviewed the CBC from today.  He has stable mild thrombocytopenia.  Counts are adequate for treatment.  He will contact the office with any bleeding.  He will return for lab, follow-up and cycle 10 FOLFOX in 2 weeks.  He will contact the office in the interim as outlined above or with any other problems.    [name] ANP/GNP-BC   08/18/2018  10:46 AM

## 2018-08-19 ENCOUNTER — Telehealth: Payer: Self-pay | Admitting: Nurse Practitioner

## 2018-08-19 NOTE — Telephone Encounter (Signed)
Scheduled appt per 5/28 los. °

## 2018-08-20 ENCOUNTER — Inpatient Hospital Stay: Payer: 59

## 2018-08-20 ENCOUNTER — Other Ambulatory Visit: Payer: Self-pay

## 2018-08-20 VITALS — BP 137/61 | HR 94 | Temp 99.1°F | Resp 16

## 2018-08-20 DIAGNOSIS — C189 Malignant neoplasm of colon, unspecified: Secondary | ICD-10-CM

## 2018-08-20 DIAGNOSIS — C180 Malignant neoplasm of cecum: Secondary | ICD-10-CM | POA: Diagnosis not present

## 2018-08-20 MED ORDER — SODIUM CHLORIDE 0.9% FLUSH
10.0000 mL | INTRAVENOUS | Status: DC | PRN
  Administered 2018-08-20: 10 mL
  Filled 2018-08-20: qty 10

## 2018-08-20 MED ORDER — HEPARIN SOD (PORK) LOCK FLUSH 100 UNIT/ML IV SOLN
500.0000 [IU] | Freq: Once | INTRAVENOUS | Status: AC | PRN
  Administered 2018-08-20: 500 [IU]
  Filled 2018-08-20: qty 5

## 2018-08-25 ENCOUNTER — Other Ambulatory Visit: Payer: Self-pay | Admitting: Oncology

## 2018-08-25 DIAGNOSIS — C189 Malignant neoplasm of colon, unspecified: Secondary | ICD-10-CM

## 2018-08-25 DIAGNOSIS — C787 Secondary malignant neoplasm of liver and intrahepatic bile duct: Secondary | ICD-10-CM

## 2018-08-28 ENCOUNTER — Other Ambulatory Visit: Payer: Self-pay | Admitting: Oncology

## 2018-08-30 ENCOUNTER — Encounter (HOSPITAL_COMMUNITY): Payer: Self-pay | Admitting: Oncology

## 2018-09-01 ENCOUNTER — Inpatient Hospital Stay (HOSPITAL_BASED_OUTPATIENT_CLINIC_OR_DEPARTMENT_OTHER): Payer: 59 | Admitting: Nurse Practitioner

## 2018-09-01 ENCOUNTER — Telehealth: Payer: Self-pay | Admitting: Nurse Practitioner

## 2018-09-01 ENCOUNTER — Encounter: Payer: Self-pay | Admitting: Nurse Practitioner

## 2018-09-01 ENCOUNTER — Other Ambulatory Visit: Payer: Self-pay

## 2018-09-01 ENCOUNTER — Inpatient Hospital Stay: Payer: 59

## 2018-09-01 ENCOUNTER — Inpatient Hospital Stay: Payer: 59 | Attending: Oncology

## 2018-09-01 VITALS — BP 118/71 | HR 95 | Temp 98.9°F | Resp 18 | Ht 71.0 in | Wt 162.2 lb

## 2018-09-01 DIAGNOSIS — C7800 Secondary malignant neoplasm of unspecified lung: Secondary | ICD-10-CM | POA: Diagnosis not present

## 2018-09-01 DIAGNOSIS — C787 Secondary malignant neoplasm of liver and intrahepatic bile duct: Secondary | ICD-10-CM

## 2018-09-01 DIAGNOSIS — D696 Thrombocytopenia, unspecified: Secondary | ICD-10-CM

## 2018-09-01 DIAGNOSIS — R109 Unspecified abdominal pain: Secondary | ICD-10-CM

## 2018-09-01 DIAGNOSIS — R0600 Dyspnea, unspecified: Secondary | ICD-10-CM | POA: Insufficient documentation

## 2018-09-01 DIAGNOSIS — Z5111 Encounter for antineoplastic chemotherapy: Secondary | ICD-10-CM | POA: Insufficient documentation

## 2018-09-01 DIAGNOSIS — C180 Malignant neoplasm of cecum: Secondary | ICD-10-CM

## 2018-09-01 DIAGNOSIS — Z79899 Other long term (current) drug therapy: Secondary | ICD-10-CM | POA: Diagnosis not present

## 2018-09-01 DIAGNOSIS — C189 Malignant neoplasm of colon, unspecified: Secondary | ICD-10-CM

## 2018-09-01 DIAGNOSIS — Z95828 Presence of other vascular implants and grafts: Secondary | ICD-10-CM

## 2018-09-01 DIAGNOSIS — Z452 Encounter for adjustment and management of vascular access device: Secondary | ICD-10-CM | POA: Diagnosis not present

## 2018-09-01 LAB — CBC WITH DIFFERENTIAL (CANCER CENTER ONLY)
Abs Immature Granulocytes: 0.03 10*3/uL (ref 0.00–0.07)
Basophils Absolute: 0 10*3/uL (ref 0.0–0.1)
Basophils Relative: 0 %
Eosinophils Absolute: 0.1 10*3/uL (ref 0.0–0.5)
Eosinophils Relative: 1 %
HCT: 38.9 % — ABNORMAL LOW (ref 39.0–52.0)
Hemoglobin: 12.4 g/dL — ABNORMAL LOW (ref 13.0–17.0)
Immature Granulocytes: 0 %
Lymphocytes Relative: 14 %
Lymphs Abs: 1.2 10*3/uL (ref 0.7–4.0)
MCH: 30.2 pg (ref 26.0–34.0)
MCHC: 31.9 g/dL (ref 30.0–36.0)
MCV: 94.6 fL (ref 80.0–100.0)
Monocytes Absolute: 1.2 10*3/uL — ABNORMAL HIGH (ref 0.1–1.0)
Monocytes Relative: 13 %
Neutro Abs: 6.5 10*3/uL (ref 1.7–7.7)
Neutrophils Relative %: 72 %
Platelet Count: 93 10*3/uL — ABNORMAL LOW (ref 150–400)
RBC: 4.11 MIL/uL — ABNORMAL LOW (ref 4.22–5.81)
RDW: 16.9 % — ABNORMAL HIGH (ref 11.5–15.5)
WBC Count: 9.1 10*3/uL (ref 4.0–10.5)
nRBC: 0 % (ref 0.0–0.2)

## 2018-09-01 LAB — CMP (CANCER CENTER ONLY)
ALT: 14 U/L (ref 0–44)
AST: 39 U/L (ref 15–41)
Albumin: 2.7 g/dL — ABNORMAL LOW (ref 3.5–5.0)
Alkaline Phosphatase: 254 U/L — ABNORMAL HIGH (ref 38–126)
Anion gap: 10 (ref 5–15)
BUN: 13 mg/dL (ref 8–23)
CO2: 22 mmol/L (ref 22–32)
Calcium: 9.3 mg/dL (ref 8.9–10.3)
Chloride: 106 mmol/L (ref 98–111)
Creatinine: 0.81 mg/dL (ref 0.61–1.24)
GFR, Est AFR Am: >60 mL/min
GFR, Estimated: >60 mL/min
Glucose, Bld: 137 mg/dL — ABNORMAL HIGH (ref 70–99)
Potassium: 3.8 mmol/L (ref 3.5–5.1)
Sodium: 138 mmol/L (ref 135–145)
Total Bilirubin: 0.5 mg/dL (ref 0.3–1.2)
Total Protein: 8.5 g/dL — ABNORMAL HIGH (ref 6.5–8.1)

## 2018-09-01 LAB — CEA (IN HOUSE-CHCC): CEA (CHCC-In House): 720.1 ng/mL — ABNORMAL HIGH (ref 0.00–5.00)

## 2018-09-01 MED ORDER — DEXAMETHASONE SODIUM PHOSPHATE 10 MG/ML IJ SOLN
INTRAMUSCULAR | Status: AC
  Filled 2018-09-01: qty 1

## 2018-09-01 MED ORDER — FLUCONAZOLE 100 MG PO TABS: 100.0000 mg | ORAL_TABLET | Freq: Every day | ORAL | 0 refills | Status: DC

## 2018-09-01 MED ORDER — DEXTROSE 5 % IV SOLN
Freq: Once | INTRAVENOUS | Status: AC
  Administered 2018-09-01: 10:00:00 via INTRAVENOUS
  Filled 2018-09-01: qty 250

## 2018-09-01 MED ORDER — LEUCOVORIN CALCIUM INJECTION 350 MG
300.0000 mg/m2 | Freq: Once | INTRAVENOUS | Status: AC
  Administered 2018-09-01: 11:00:00 588 mg via INTRAVENOUS
  Filled 2018-09-01: qty 29.4

## 2018-09-01 MED ORDER — SODIUM CHLORIDE 0.9 % IV SOLN
1800.0000 mg/m2 | INTRAVENOUS | Status: DC
  Administered 2018-09-01: 13:00:00 3550 mg via INTRAVENOUS
  Filled 2018-09-01: qty 71

## 2018-09-01 MED ORDER — SODIUM CHLORIDE 0.9% FLUSH
10.0000 mL | INTRAVENOUS | Status: DC | PRN
  Administered 2018-09-01: 10 mL
  Filled 2018-09-01: qty 10

## 2018-09-01 MED ORDER — PALONOSETRON HCL INJECTION 0.25 MG/5ML
INTRAVENOUS | Status: AC
  Filled 2018-09-01: qty 5

## 2018-09-01 MED ORDER — OXALIPLATIN CHEMO INJECTION 100 MG/20ML
65.0000 mg/m2 | Freq: Once | INTRAVENOUS | Status: AC
  Administered 2018-09-01: 11:00:00 125 mg via INTRAVENOUS
  Filled 2018-09-01: qty 20

## 2018-09-01 MED ORDER — DEXAMETHASONE SODIUM PHOSPHATE 10 MG/ML IJ SOLN
10.0000 mg | Freq: Once | INTRAMUSCULAR | Status: AC
  Administered 2018-09-01: 10 mg via INTRAVENOUS

## 2018-09-01 MED ORDER — PALONOSETRON HCL INJECTION 0.25 MG/5ML
0.2500 mg | Freq: Once | INTRAVENOUS | Status: AC
  Administered 2018-09-01: 10:00:00 0.25 mg via INTRAVENOUS

## 2018-09-01 NOTE — Patient Instructions (Signed)
[facility] Discharge Instructions for Patients Receiving Chemotherapy  Today you received the following chemotherapy agents: Oxaliplatin, Leucovorin, and 5FU.  To help prevent nausea and vomiting after your treatment, we encourage you to take your nausea medication as directed.   If you develop nausea and vomiting that is not controlled by your nausea medication, call the clinic.   BELOW ARE SYMPTOMS THAT SHOULD BE REPORTED IMMEDIATELY:  *FEVER GREATER THAN 100.5 F  *CHILLS WITH OR WITHOUT FEVER  NAUSEA AND VOMITING THAT IS NOT CONTROLLED WITH YOUR NAUSEA MEDICATION  *UNUSUAL SHORTNESS OF BREATH  *UNUSUAL BRUISING OR BLEEDING  TENDERNESS IN MOUTH AND THROAT WITH OR WITHOUT PRESENCE OF ULCERS  *URINARY PROBLEMS  *BOWEL PROBLEMS  UNUSUAL RASH Items with * indicate a potential emergency and should be followed up as soon as possible.  Feel free to call the clinic should you have any questions or concerns. The clinic phone number is [phone].  Please show the CHEMO ALERT CARD at check-in to the Emergency Department and triage nurse.    

## 2018-09-01 NOTE — Telephone Encounter (Signed)
Scheduled appt per 6/11 los. °

## 2018-09-01 NOTE — Progress Notes (Addendum)
  [facility] OFFICE PROGRESS NOTE   Diagnosis: Metastatic adenocarcinoma  INTERVAL HISTORY:   Thomas Riley returns as scheduled.  He completed cycle 9 FOLFOX 08/18/2018.  He denies nausea/vomiting.  No mouth sores.  He noted some "cracks" at the corners of his mouth which have resolved.  No diarrhea.  No significant cold sensitivity.  No numbness or tingling in the hands or feet.  Abdominal pain continues to be improved.  He estimates taking 2 oxycodone a day.  Objective:  Vital signs in last 24 hours:  Blood pressure 118/71, pulse 95, temperature 98.9 F (37.2 C), temperature source Oral, resp. rate 18, height '5\' 11"'$  (1.803 m), weight 162 lb 3.2 oz (73.6 kg), SpO2 98 %.    HEENT: White coating over tongue.  No buccal thrush.  No ulcers. GI: Liver palpable right upper abdomen. Vascular: No leg edema. Neuro: Vibratory sense mildly decreased over the fingertips per tuning fork exam. Skin: Palms without erythema. Port-A-Cath without erythema.   Lab Results:  Lab Results  Component Value Date   WBC 9.1 09/01/2018   HGB 12.4 (L) 09/01/2018   HCT 38.9 (L) 09/01/2018   MCV 94.6 09/01/2018   PLT 93 (L) 09/01/2018   NEUTROABS 6.5 09/01/2018    Imaging:  No results found.  Medications: I have reviewed the patient's current medications.  Assessment/Plan: 1. Right lower quadrant massextensive liver metastases  CTs 04/22/2018-right lower quadrant mass associated with the ileum and cecum, hepatomegaly with extensive liver metastases, multiple lung nodules consistent with metastases  Colonoscopy 04/24/2018- multiple benign-appearing polyps removed, no colon mass  04/24/2018 chromogranin A 350  04/24/2018 CEA 632  Biopsy liver lesion 04/25/2018- adenocarcinoma with extensive necrosis; microsatellite stable; tumor mutation burden 6; KRAS A59E; PIK3CA; PTEN  Cycle 1 FOLFOX 04/28/2018  PET scan 05/10/2018- widespread bilateral hepatic metastasis. Hypermetabolic right  lower quadrant mass. Hypermetabolic ileocolic mesenteric nodal metastasis.  Cycle 2 FOLFOX 05/12/2018 (5-FU dose reduced due to mucositis following cycle 1)  Cycle 3 FOLFOX 05/26/2018 (oxaliplatin dose reduced secondary to thrombocytopenia)  Cycle 4 FOLFOX 06/09/2018  Cycle 5 FOLFOX 06/23/2018  CT 07/01/2018- decreased tumor burden in the liver, decreased right abdomen mesenteric/omental implants  Cycle 6 FOLFOX 07/07/2018  Cycle 7 FOLFOX 07/21/2018  Cycle 8 FOLFOX 08/04/2018  Cycle 9 FOLFOX 08/18/2018  Cycle 10 FOLFOX 09/01/2018  2.Lower extremity edema secondary to massive hepatomegaly and hypoalbuminemia-resolved  3.Chronic back pain  4.Proximal leg weakness- deconditioning?, Spinal stenosis?-Improved  5.Mucositis, candidiasis 05/06/2018. Resolved following a course of Diflucan.Repeat course of Diflucan 07/07/2018; repeat course of Diflucan 09/01/2018   Disposition: [name] appears stable.  He has completed 9 cycles of FOLFOX.  He continues to tolerate the chemotherapy well.  Plan to proceed with cycle 10 today as scheduled.  He will undergo restaging CTs prior to his next visit in 2 weeks.  We reviewed the CBC from today.  He has stable mild thrombocytopenia.  Counts adequate to proceed with treatment.  He will complete a course of Diflucan for thrush.  He will return for lab, follow-up, FOLFOX in 2 weeks.  He will contact the office in the interim with any problems.    [name] ANP/GNP-BC   09/01/2018  9:15 AM

## 2018-09-01 NOTE — Progress Notes (Signed)
Thrombocytopenia addressed in office note by [name], np.  Ok to proceed.

## 2018-09-02 NOTE — Progress Notes (Signed)
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 Completed application e-mailed  to [email or web address]. Mr. Lanigan cc'd on e-mail for his records.

## 2018-09-03 ENCOUNTER — Other Ambulatory Visit: Payer: Self-pay

## 2018-09-03 ENCOUNTER — Inpatient Hospital Stay: Payer: 59

## 2018-09-03 VITALS — BP 147/75 | HR 115 | Temp 98.0°F | Resp 17

## 2018-09-03 DIAGNOSIS — C189 Malignant neoplasm of colon, unspecified: Secondary | ICD-10-CM

## 2018-09-03 DIAGNOSIS — C180 Malignant neoplasm of cecum: Secondary | ICD-10-CM | POA: Diagnosis not present

## 2018-09-03 MED ORDER — SODIUM CHLORIDE 0.9% FLUSH
10.0000 mL | INTRAVENOUS | Status: DC | PRN
  Administered 2018-09-03: 10:00:00 10 mL
  Filled 2018-09-03: qty 10

## 2018-09-03 MED ORDER — HEPARIN SOD (PORK) LOCK FLUSH 100 UNIT/ML IV SOLN
500.0000 [IU] | Freq: Once | INTRAVENOUS | Status: AC | PRN
  Administered 2018-09-03: 500 [IU]
  Filled 2018-09-03: qty 5

## 2018-09-09 ENCOUNTER — Telehealth: Payer: Self-pay | Admitting: Nurse Practitioner

## 2018-09-09 ENCOUNTER — Other Ambulatory Visit: Payer: Self-pay | Admitting: Nurse Practitioner

## 2018-09-09 ENCOUNTER — Ambulatory Visit (HOSPITAL_COMMUNITY)
Admission: RE | Admit: 2018-09-09 | Discharge: 2018-09-09 | Disposition: A | Discharge status: Home / Self Care | Payer: 59 | Source: Ambulatory Visit | Attending: Nurse Practitioner | Admitting: Nurse Practitioner

## 2018-09-09 ENCOUNTER — Other Ambulatory Visit: Payer: Self-pay

## 2018-09-09 ENCOUNTER — Encounter (HOSPITAL_COMMUNITY): Payer: Self-pay

## 2018-09-09 DIAGNOSIS — C787 Secondary malignant neoplasm of liver and intrahepatic bile duct: Secondary | ICD-10-CM

## 2018-09-09 DIAGNOSIS — C189 Malignant neoplasm of colon, unspecified: Secondary | ICD-10-CM

## 2018-09-09 DIAGNOSIS — K5732 Diverticulitis of large intestine without perforation or abscess without bleeding: Secondary | ICD-10-CM

## 2018-09-09 MED ORDER — CIPROFLOXACIN HCL 500 MG PO TABS: 500.0000 mg | ORAL_TABLET | Freq: Two times a day (BID) | ORAL | 0 refills | Status: DC

## 2018-09-09 MED ORDER — HEPARIN SOD (PORK) LOCK FLUSH 100 UNIT/ML IV SOLN
500.0000 [IU] | Freq: Once | INTRAVENOUS | Status: AC
  Administered 2018-09-09: 500 [IU] via INTRAVENOUS

## 2018-09-09 MED ORDER — METRONIDAZOLE 500 MG PO TABS: 500.0000 mg | ORAL_TABLET | Freq: Three times a day (TID) | ORAL | 0 refills | Status: DC

## 2018-09-09 MED ORDER — SODIUM CHLORIDE (PF) 0.9 % IJ SOLN
INTRAMUSCULAR | Status: AC
  Filled 2018-09-09: qty 50

## 2018-09-09 MED ORDER — IOHEXOL 300 MG/ML  SOLN
100.0000 mL | Freq: Once | INTRAMUSCULAR | Status: AC | PRN
  Administered 2018-09-09: 100 mL via INTRAVENOUS

## 2018-09-09 MED ORDER — HEPARIN SOD (PORK) LOCK FLUSH 100 UNIT/ML IV SOLN
INTRAVENOUS | Status: AC
  Administered 2018-09-09: 500 [IU] via INTRAVENOUS
  Filled 2018-09-09: qty 5

## 2018-09-09 NOTE — Telephone Encounter (Signed)
I contacted Thomas Riley after receiving the CT report from this morning showing mild acute diverticulitis of the sigmoid colon.  There was no perforation or abscess.  He denies fever.  No change in baseline intermittent abdominal pain which has been ongoing for months.  Overall states he feels well.  No diarrhea.  I sent prescriptions to his pharmacy for ciprofloxacin 500 mg every 12 hours and Flagyl 500 mg every 8 hours.  He understands to seek evaluation in the emergency department over the weekend with fever, chills, abdominal pain, diarrhea.

## 2018-09-10 ENCOUNTER — Other Ambulatory Visit: Payer: Self-pay | Admitting: Oncology

## 2018-09-12 ENCOUNTER — Telehealth: Payer: Self-pay | Admitting: *Deleted

## 2018-09-12 NOTE — Telephone Encounter (Signed)
Medical records faxed to [employer] - Release: [id number]

## 2018-09-15 ENCOUNTER — Inpatient Hospital Stay: Payer: 59

## 2018-09-15 ENCOUNTER — Encounter: Payer: Self-pay | Admitting: Nurse Practitioner

## 2018-09-15 ENCOUNTER — Inpatient Hospital Stay (HOSPITAL_BASED_OUTPATIENT_CLINIC_OR_DEPARTMENT_OTHER): Payer: 59 | Admitting: Nurse Practitioner

## 2018-09-15 ENCOUNTER — Other Ambulatory Visit: Payer: Self-pay

## 2018-09-15 VITALS — BP 129/72 | HR 104 | Temp 98.4°F | Resp 19 | Ht 71.0 in | Wt 162.7 lb

## 2018-09-15 DIAGNOSIS — C180 Malignant neoplasm of cecum: Secondary | ICD-10-CM

## 2018-09-15 DIAGNOSIS — C787 Secondary malignant neoplasm of liver and intrahepatic bile duct: Secondary | ICD-10-CM

## 2018-09-15 DIAGNOSIS — C7800 Secondary malignant neoplasm of unspecified lung: Secondary | ICD-10-CM | POA: Diagnosis not present

## 2018-09-15 DIAGNOSIS — Z79899 Other long term (current) drug therapy: Secondary | ICD-10-CM

## 2018-09-15 DIAGNOSIS — Z95828 Presence of other vascular implants and grafts: Secondary | ICD-10-CM

## 2018-09-15 DIAGNOSIS — C189 Malignant neoplasm of colon, unspecified: Secondary | ICD-10-CM

## 2018-09-15 DIAGNOSIS — R0600 Dyspnea, unspecified: Secondary | ICD-10-CM

## 2018-09-15 LAB — CBC WITH DIFFERENTIAL (CANCER CENTER ONLY)
Abs Immature Granulocytes: 0.05 10*3/uL (ref 0.00–0.07)
Basophils Absolute: 0 10*3/uL (ref 0.0–0.1)
Basophils Relative: 0 %
Eosinophils Absolute: 0 10*3/uL (ref 0.0–0.5)
Eosinophils Relative: 0 %
HCT: 39.4 % (ref 39.0–52.0)
Hemoglobin: 12.6 g/dL — ABNORMAL LOW (ref 13.0–17.0)
Immature Granulocytes: 1 %
Lymphocytes Relative: 10 %
Lymphs Abs: 1.1 10*3/uL (ref 0.7–4.0)
MCH: 30.3 pg (ref 26.0–34.0)
MCHC: 32 g/dL (ref 30.0–36.0)
MCV: 94.7 fL (ref 80.0–100.0)
Monocytes Absolute: 1.5 10*3/uL — ABNORMAL HIGH (ref 0.1–1.0)
Monocytes Relative: 13 %
Neutro Abs: 8.4 10*3/uL — ABNORMAL HIGH (ref 1.7–7.7)
Neutrophils Relative %: 76 %
Platelet Count: 106 10*3/uL — ABNORMAL LOW (ref 150–400)
RBC: 4.16 MIL/uL — ABNORMAL LOW (ref 4.22–5.81)
RDW: 17.7 % — ABNORMAL HIGH (ref 11.5–15.5)
WBC Count: 11.1 10*3/uL — ABNORMAL HIGH (ref 4.0–10.5)
nRBC: 0 % (ref 0.0–0.2)

## 2018-09-15 LAB — CMP (CANCER CENTER ONLY)
ALT: 10 U/L (ref 0–44)
AST: 41 U/L (ref 15–41)
Albumin: 2.7 g/dL — ABNORMAL LOW (ref 3.5–5.0)
Alkaline Phosphatase: 266 U/L — ABNORMAL HIGH (ref 38–126)
Anion gap: 10 (ref 5–15)
BUN: 9 mg/dL (ref 8–23)
CO2: 22 mmol/L (ref 22–32)
Calcium: 9.3 mg/dL (ref 8.9–10.3)
Chloride: 101 mmol/L (ref 98–111)
Creatinine: 0.9 mg/dL (ref 0.61–1.24)
GFR, Est AFR Am: >60 mL/min (ref >60)
GFR, Estimated: >60 mL/min (ref >60)
Glucose, Bld: 184 mg/dL — ABNORMAL HIGH (ref 70–99)
Potassium: 3.7 mmol/L (ref 3.5–5.1)
Sodium: 133 mmol/L — ABNORMAL LOW (ref 135–145)
Total Bilirubin: 0.7 mg/dL (ref 0.3–1.2)
Total Protein: 8.5 g/dL — ABNORMAL HIGH (ref 6.5–8.1)

## 2018-09-15 LAB — CEA (IN HOUSE-CHCC): CEA (CHCC-In House): 1100.67 ng/mL — ABNORMAL HIGH (ref 0.00–5.00)

## 2018-09-15 MED ORDER — SODIUM CHLORIDE 0.9% FLUSH
10.0000 mL | INTRAVENOUS | Status: DC | PRN
  Administered 2018-09-15: 10 mL
  Filled 2018-09-15: qty 10

## 2018-09-15 MED ORDER — OXYCODONE-ACETAMINOPHEN 5-325 MG PO TABS: 1.0000 | ORAL_TABLET | ORAL | 0 refills | Status: DC | PRN

## 2018-09-15 NOTE — Progress Notes (Signed)
[facility] OFFICE PROGRESS NOTE   Diagnosis: Metastatic adenocarcinoma  INTERVAL HISTORY:   Thomas Riley returns as scheduled.  He completed cycle 10 FOLFOX 09/01/2018.  He denies nausea/vomiting.  No mouth sores.  No diarrhea.  No left-sided abdominal pain.  No fever.  No rectal bleeding.  He has intermittent right-sided abdominal pain which has been ongoing for many months.  He takes Percocet as needed.  He has mild intermittent dyspnea.  No cough.  After the port was accessed this morning he became "sweaty and lightheaded".  Symptoms have resolved.  Objective:  Vital signs in last 24 hours:  Blood pressure 129/72, pulse (!) 104, temperature 98.4 F (36.9 C), temperature source Oral, resp. rate 19, height '5\' 11"'$  (1.803 m), weight 162 lb 11.2 oz (73.8 kg), SpO2 99 %.    HEENT: White coating over tongue.  No buccal thrush. Resp: Lungs clear bilaterally. Cardio: Regular rate and rhythm. GI: Fullness right upper abdomen. Vascular: No leg edema.  Calf soft and nontender.  Skin: Palms without erythema. Port-A-Cath without erythema.   Lab Results:  Lab Results  Component Value Date   WBC 11.1 (H) 09/15/2018   HGB 12.6 (L) 09/15/2018   HCT 39.4 09/15/2018   MCV 94.7 09/15/2018   PLT 106 (L) 09/15/2018   NEUTROABS 8.4 (H) 09/15/2018    Imaging:  No results found.  Medications: I have reviewed the patient's current medications.  Assessment/Plan: 1. Right lower quadrant massextensive liver metastases  CTs 04/22/2018-right lower quadrant mass associated with the ileum and cecum, hepatomegaly with extensive liver metastases, multiple lung nodules consistent with metastases  Colonoscopy 04/24/2018-multiple benign-appearing polyps removed, no colon mass  04/24/2018 chromogranin A 350  04/24/2018 CEA 632  Biopsy liver lesion 04/25/2018-adenocarcinoma with extensive necrosis; microsatellite stable; tumor mutation burden 6; KRAS A59E; PIK3CA; PTEN  Cycle 1 FOLFOX  04/28/2018  PET scan 05/10/2018-widespread bilateral hepatic metastasis. Hypermetabolic right lower quadrant mass. Hypermetabolic ileocolic mesenteric nodal metastasis.  Cycle 2 FOLFOX 05/12/2018 (5-FU dose reduced due to mucositis following cycle 1)  Cycle 3 FOLFOX 05/26/2018 (oxaliplatin dose reduced secondary to thrombocytopenia)  Cycle 4 FOLFOX 06/09/2018  Cycle 5 FOLFOX 06/23/2018  CT 07/01/2018-decreased tumor burden in the liver, decreased right abdomen mesenteric/omental implants  Cycle 6 FOLFOX 07/07/2018  Cycle 7 FOLFOX 07/21/2018  Cycle 8 FOLFOX 08/04/2018  Cycle 9 FOLFOX 08/18/2018  Cycle 10 FOLFOX 09/01/2018  CT 09/09/2018- reduction in size of pericecal implant and ileocecal lymphadenopathy.  Mild improvement in widespread hepatic metastasis.  Small subpleural right middle lobe pulmonary nodule not identified on prior.  Mild acute diverticulitis of the sigmoid colon.  Maintenance Xeloda 09/19/2018  2.Lower extremity edema secondary to massive hepatomegaly and hypoalbuminemia-resolved  3.Chronic back pain  4.Proximal leg weakness-deconditioning?, Spinal stenosis?-Improved  5.Mucositis, candidiasis 05/06/2018. Resolved following a course of Diflucan.Repeat course of Diflucan 07/07/2018; repeat course of Diflucan 09/01/2018   Disposition: Thomas Riley appears stable.  He has completed 10 cycles of FOLFOX.  Restaging CTs show mildly improved to stable disease.  Dr. Benay Spice feels he has likely achieved maximal benefit from Oxaliplatin.  We discussed options at this point to include a treatment break, maintenance therapy with Xeloda, changing treatment to FOLFIRI.  The plan is to proceed with maintenance therapy with Xeloda.  We reviewed potential toxicities associated with Xeloda including mouth sores, diarrhea, hand-foot syndrome, skin hyperpigmentation, increased sensitivity to sun.  He agrees with this plan.  We anticipate he will begin Xeloda around 09/19/2018.  He  will return for a  follow-up visit in 3 weeks.  He will contact the office in the interim with any problems.  Patient seen with Dr. Benay Spice.  25 minutes were spent face-to-face at today's visit with the majority of that time involved in counseling/coordination of care.  [name] ANP/GNP-BC   09/15/2018  11:20 AM  This was a shared visit with [name].  Thomas Riley appears stable.  The restaging CT reveals slight improvement in the metastatic tumor burden.  The CEA is higher.  We discussed treatment options.  I recommend beginning maintenance therapy.  I recommend daily Xeloda.  We reviewed potential toxicities associated with Xeloda.  He agrees to proceed.  We considered the addition of Avastin.  We decided to hold Avastin with the CT evidence of diverticulitis. I reviewed the CT images.  Julieanne Manson, MD

## 2018-09-15 NOTE — Patient Instructions (Signed)
Have Imodium on hand at home (for diarrhea)

## 2018-09-16 ENCOUNTER — Telehealth: Payer: Self-pay | Admitting: Pharmacist

## 2018-09-16 ENCOUNTER — Telehealth: Payer: Self-pay

## 2018-09-16 ENCOUNTER — Telehealth: Payer: Self-pay | Admitting: Nurse Practitioner

## 2018-09-16 DIAGNOSIS — C189 Malignant neoplasm of colon, unspecified: Secondary | ICD-10-CM

## 2018-09-16 MED ORDER — CAPECITABINE 500 MG PO TABS: ORAL_TABLET | ORAL | 0 refills | Status: DC

## 2018-09-16 NOTE — Telephone Encounter (Signed)
Oral Oncology Pharmacist Encounter  Insurance authorization for Xeloda has been approved. Per insurance requirement, [facility]. [facility]oda prescription has been [facility] to [facility] in [place], [place] We will follow-up with pharmacy on Monday, 09/19/18, to ensure timely processing of patient's prescription.  Johny Drilling, PharmD, BCPS, BCOP  09/16/2018 12:55 PM Oral Oncology Clinic [phone]

## 2018-09-16 NOTE — Telephone Encounter (Signed)
Oral Oncology Pharmacist Encounter  Received new prescription for Xeloda (capecitabine) for the maintenance treatment of metastatic colon cancer, planned duration until disease progression or unacceptable toxicity.  Patient has completed 10 cycles of FOLFOX for metastatic disease with some disease improvement. Plan to initiate Xeloda as maintenance therapy prior to switching treatment to FOLFIRI, if his disease progresses  Xeloda is planned to be administered at ~650 mg/m2 by mouth 2 times daily, continuously This calculates to 1500mg  in AM and 1000mg  in PM daily  Labs from 09/15/18 assessed, OK for treatment initiation. Noted mild thrombocytopenia, this will be monitored during treatemnt  Current medication list in Epic reviewed, DDIs with Xeloda identified:  Category C interaction with Xeloda and omeprazole: conflicting retrospective data with one analysis showing decrease in OS and PFS when Xeloda was used for adjuvant treatment of gastric cancer concurrently with PPI, another study showing no effect on important efficacy outcomes. Patient will be screened for ability to discontinue PPI therapy. If unable to d/c PPI, no change to treatment is indicated.  Category C interaction with Xeloda and metronidazole leading to possible increased systemic exposure to fluorouracil containing products, the exact mechanism is unknown. Patient prescribed 7 day course of metronidazole on 09/09/18 for mild diverticulitis. I will ensure patient has completed this course prior to Xeloda start date.  Prescription has been e-scribed to the [facility] for benefits analysis and approval.  Oral Oncology Clinic will continue to follow for insurance authorization, copayment issues, initial counseling and start date.  Johny Drilling, PharmD, BCPS, BCOP  09/16/2018 9:45 AM Oral Oncology Clinic [phone]

## 2018-09-16 NOTE — Telephone Encounter (Signed)
Oral Oncology Patient Advocate Encounter  Received notification from [facility] that prior authorization for Capecitabine is required.  PA submitted on CoverMyMeds Key [id number] Status is pending  Oral Oncology Clinic will continue to follow.  [place] Patient [place] Phone [phone] Fax [phone] 09/16/2018    10:13 AM

## 2018-09-16 NOTE — Telephone Encounter (Signed)
Oral Oncology Patient Advocate Encounter  Prior Authorization for Xeloda has been approved.    PA# [id number] Effective dates: 09/16/18 through 09/16/19  Oral Oncology Clinic will continue to follow.   [place] Patient [place] Phone [phone] Fax [phone] 09/16/2018    12:09 PM

## 2018-09-16 NOTE — Telephone Encounter (Signed)
Oral Chemotherapy Pharmacist Encounter   I spoke with patient and wife, Thomas Riley, for overview of: Xeloda (capecitabine) for the maintenance treatment of metastatic colon cancer, planned duration until disease progression or unacceptable toxicity.   Counseled patient on administration, dosing, side effects, monitoring, drug-food interactions, safe handling, storage, and disposal.  Patient will take Xeloda 500mg  tablets, 3 tablets (1500mg ) by mouth in AM and 2 tabs (1000mg ) by mouth in PM, within 30 minutes of finishing meals, continuously.   Xeloda start date: TBD, pending medication acquisition  Adverse effects include but are not limited to: fatigue, decreased blood counts, GI upset, diarrhea, mouth sores, and hand-foot syndrome.  Patient has anti-emetic on hand and knows to take it if nausea develops.   Patient will obtain anti diarrheal and alert the office of 4 or more loose stools above baseline.  Reviewed with patient importance of keeping a medication schedule and plan for any missed doses.  Medication reconciliation performed and medication/allergy list updated. Patient has completed courses of metronidazole and ciprofloxacin, and previously fluconazole. These medications have been removed from medication list.  Insurance authorization for Xeloda has been obtained. [facility]. Prescription was e-scribed to the [place], [place] location today. We will follow-up with pharmacy early next week and will update patient with any new information.  All questions answered.  Thomas Riley voiced understanding and appreciation.   They know to call the office with questions or concerns.  Thomas Riley, PharmD, BCPS, BCOP  09/16/2018   1:22 PM Oral Oncology Clinic [phone]

## 2018-09-16 NOTE — Telephone Encounter (Signed)
Called and spoke with patient. Confirmed appt  °

## 2018-09-17 ENCOUNTER — Inpatient Hospital Stay: Payer: 59

## 2018-09-19 NOTE — Telephone Encounter (Signed)
Oral Oncology Patient Advocate Encounter  [facility].  [facility] [place] and will contact the patient is 24-48 hours to get payment and to schedule shipment. I asked about the patients copay and they do not have that information yet, they are also not able to give me that information.  I called the patient and gave him this information. I advised him to call [phone] if he has not heard from them by tomorrow evening. He verbalized understanding and great appreciation.  [place] Patient [place] Phone [phone] Fax [phone] 09/19/2018   10:46 AM

## 2018-09-20 ENCOUNTER — Telehealth: Payer: Self-pay | Admitting: *Deleted

## 2018-09-20 NOTE — Telephone Encounter (Addendum)
Called to inquire if he needs to stop all sugar in his diet? [facility] told them to stop all sugar. He received his Xeloda today and started this morning. Asking if he should continue is routine eye exams since he has macular degeneration and if stopping his Pressurevision vitamin will cause this to progress? Per Dr. Benay Spice: No need to stop sugar. Proceed with eye exam when it is due. Stopping vitamin will not cause it to progress.

## 2018-09-20 NOTE — Telephone Encounter (Signed)
Oral Oncology Patient Advocate Encounter  The patient called and left me a voicemail late yesterday evening stating the he spoke with [facility]. Xeloda copay is $10 and they shipped it 6/29 to deliver on 6/30 to the patients home.   [place] Patient [place] Phone [phone] Fax [phone] 09/20/2018   8:12 AM

## 2018-09-25 ENCOUNTER — Other Ambulatory Visit: Payer: Self-pay | Admitting: Oncology

## 2018-09-29 ENCOUNTER — Inpatient Hospital Stay: Payer: 59

## 2018-09-29 ENCOUNTER — Inpatient Hospital Stay: Payer: 59 | Admitting: Nurse Practitioner

## 2018-09-29 ENCOUNTER — Ambulatory Visit: Payer: 59

## 2018-10-03 ENCOUNTER — Telehealth: Payer: Self-pay

## 2018-10-03 ENCOUNTER — Other Ambulatory Visit: Payer: Self-pay | Admitting: Oncology

## 2018-10-03 ENCOUNTER — Other Ambulatory Visit: Payer: Self-pay | Admitting: Nurse Practitioner

## 2018-10-03 DIAGNOSIS — C189 Malignant neoplasm of colon, unspecified: Secondary | ICD-10-CM

## 2018-10-03 DIAGNOSIS — C787 Secondary malignant neoplasm of liver and intrahepatic bile duct: Secondary | ICD-10-CM

## 2018-10-03 MED ORDER — OXYCODONE-ACETAMINOPHEN 5-325 MG PO TABS: 1.0000 | ORAL_TABLET | ORAL | 0 refills | Status: DC | PRN

## 2018-10-03 NOTE — Telephone Encounter (Signed)
Received voicemail from patients wife stating that Mr Popper needed a refill on his pain medication. Lisa aware.

## 2018-10-04 ENCOUNTER — Telehealth: Payer: Self-pay

## 2018-10-04 NOTE — Telephone Encounter (Signed)
Returned TC to patients wife in regard to voicemail that she left yesterday stating that Mr Wagar needed a refill on his pain medication. I let him know that [facility] Patient verbalized understanding. No further problems or concerns at this time.

## 2018-10-11 ENCOUNTER — Inpatient Hospital Stay: Payer: 59

## 2018-10-11 ENCOUNTER — Other Ambulatory Visit: Payer: Self-pay

## 2018-10-11 ENCOUNTER — Inpatient Hospital Stay: Payer: 59 | Attending: Oncology | Admitting: Oncology

## 2018-10-11 ENCOUNTER — Telehealth: Payer: Self-pay | Admitting: Oncology

## 2018-10-11 VITALS — BP 119/65 | HR 107 | Temp 98.5°F | Resp 18 | Ht 71.0 in | Wt 155.6 lb

## 2018-10-11 DIAGNOSIS — R1903 Right lower quadrant abdominal swelling, mass and lump: Secondary | ICD-10-CM | POA: Diagnosis not present

## 2018-10-11 DIAGNOSIS — B370 Candidal stomatitis: Secondary | ICD-10-CM | POA: Diagnosis not present

## 2018-10-11 DIAGNOSIS — R110 Nausea: Secondary | ICD-10-CM | POA: Diagnosis not present

## 2018-10-11 DIAGNOSIS — Z79899 Other long term (current) drug therapy: Secondary | ICD-10-CM | POA: Diagnosis not present

## 2018-10-11 DIAGNOSIS — C7800 Secondary malignant neoplasm of unspecified lung: Secondary | ICD-10-CM | POA: Insufficient documentation

## 2018-10-11 DIAGNOSIS — Z452 Encounter for adjustment and management of vascular access device: Secondary | ICD-10-CM | POA: Diagnosis not present

## 2018-10-11 DIAGNOSIS — M545 Low back pain: Secondary | ICD-10-CM | POA: Insufficient documentation

## 2018-10-11 DIAGNOSIS — C787 Secondary malignant neoplasm of liver and intrahepatic bile duct: Secondary | ICD-10-CM | POA: Diagnosis not present

## 2018-10-11 DIAGNOSIS — Z95828 Presence of other vascular implants and grafts: Secondary | ICD-10-CM

## 2018-10-11 DIAGNOSIS — R630 Anorexia: Secondary | ICD-10-CM | POA: Diagnosis not present

## 2018-10-11 DIAGNOSIS — R531 Weakness: Secondary | ICD-10-CM | POA: Insufficient documentation

## 2018-10-11 DIAGNOSIS — R6881 Early satiety: Secondary | ICD-10-CM | POA: Insufficient documentation

## 2018-10-11 DIAGNOSIS — G8929 Other chronic pain: Secondary | ICD-10-CM | POA: Insufficient documentation

## 2018-10-11 DIAGNOSIS — Z9221 Personal history of antineoplastic chemotherapy: Secondary | ICD-10-CM | POA: Diagnosis not present

## 2018-10-11 DIAGNOSIS — C180 Malignant neoplasm of cecum: Secondary | ICD-10-CM

## 2018-10-11 LAB — CMP (CANCER CENTER ONLY)
ALT: 11 U/L (ref 0–44)
AST: 55 U/L — ABNORMAL HIGH (ref 15–41)
Albumin: 2.7 g/dL — ABNORMAL LOW (ref 3.5–5.0)
Alkaline Phosphatase: 297 U/L — ABNORMAL HIGH (ref 38–126)
Anion gap: 12 (ref 5–15)
BUN: 12 mg/dL (ref 8–23)
CO2: 23 mmol/L (ref 22–32)
Calcium: 9.7 mg/dL (ref 8.9–10.3)
Chloride: 102 mmol/L (ref 98–111)
Creatinine: 0.8 mg/dL (ref 0.61–1.24)
GFR, Est AFR Am: >60 mL/min (ref >60)
GFR, Estimated: >60 mL/min (ref >60)
Glucose, Bld: 158 mg/dL — ABNORMAL HIGH (ref 70–99)
Potassium: 4 mmol/L (ref 3.5–5.1)
Sodium: 137 mmol/L (ref 135–145)
Total Bilirubin: 1.1 mg/dL (ref 0.3–1.2)
Total Protein: 8.3 g/dL — ABNORMAL HIGH (ref 6.5–8.1)

## 2018-10-11 LAB — CBC WITH DIFFERENTIAL (CANCER CENTER ONLY)
Abs Immature Granulocytes: 0.06 10*3/uL (ref 0.00–0.07)
Basophils Absolute: 0 10*3/uL (ref 0.0–0.1)
Basophils Relative: 0 %
Eosinophils Absolute: 0.1 10*3/uL (ref 0.0–0.5)
Eosinophils Relative: 1 %
HCT: 35.9 % — ABNORMAL LOW (ref 39.0–52.0)
Hemoglobin: 11.5 g/dL — ABNORMAL LOW (ref 13.0–17.0)
Immature Granulocytes: 1 %
Lymphocytes Relative: 11 %
Lymphs Abs: 1 10*3/uL (ref 0.7–4.0)
MCH: 32.1 pg (ref 26.0–34.0)
MCHC: 32 g/dL (ref 30.0–36.0)
MCV: 100.3 fL — ABNORMAL HIGH (ref 80.0–100.0)
Monocytes Absolute: 1 10*3/uL (ref 0.1–1.0)
Monocytes Relative: 10 %
Neutro Abs: 7.4 10*3/uL (ref 1.7–7.7)
Neutrophils Relative %: 77 %
Platelet Count: 162 10*3/uL (ref 150–400)
RBC: 3.58 MIL/uL — ABNORMAL LOW (ref 4.22–5.81)
RDW: 22.5 % — ABNORMAL HIGH (ref 11.5–15.5)
WBC Count: 9.5 10*3/uL (ref 4.0–10.5)
nRBC: 0 % (ref 0.0–0.2)

## 2018-10-11 LAB — CEA (IN HOUSE-CHCC): CEA (CHCC-In House): 1221.48 ng/mL — ABNORMAL HIGH (ref 0.00–5.00)

## 2018-10-11 MED ORDER — SODIUM CHLORIDE 0.9% FLUSH
10.0000 mL | INTRAVENOUS | Status: DC | PRN
  Administered 2018-10-11: 10 mL
  Filled 2018-10-11: qty 10

## 2018-10-11 MED ORDER — HEPARIN SOD (PORK) LOCK FLUSH 100 UNIT/ML IV SOLN
500.0000 [IU] | Freq: Once | INTRAVENOUS | Status: AC | PRN
  Administered 2018-10-11: 09:00:00 500 [IU]
  Filled 2018-10-11: qty 5

## 2018-10-11 NOTE — Progress Notes (Signed)
[place] OFFICE PROGRESS NOTE   Diagnosis: Colon cancer INTERVAL HISTORY:   [name] begin maintenance Xeloda on 09/20/2018.  He reports anorexia and early satiety.  No vomiting.  He is having bowel movements.  No mouth sores, diarrhea, or hand/foot pain.  He complains of pain at the lower back.  This has been a chronic problem, but has been worse recently.  He takes oxycodone for lower back and abdominal pain.  Objective:  Vital signs in last 24 hours:  Blood pressure 119/65, pulse (!) 107, temperature 98.5 F (36.9 C), temperature source Oral, resp. rate 18, height '5\' 11"'$  (1.803 m), weight 155 lb 9.6 oz (70.6 kg), SpO2 98 %.    HEENT: Thrush over the tongue and buccal mucosa Resp: Lungs clear bilaterally Cardio: Regular rate and rhythm GI: The liver is palpable throughout the upper abdomen Vascular: No leg edema Neuro: The leg and foot strength is intact bilaterally Skin: Palms and soles without erythema or skin breakdown Musculoskeletal: The area of discomfort is at the lower lumbar/sacral spine.  No tenderness.  Portacath/PICC-without erythema  Lab Results:  Lab Results  Component Value Date   WBC 9.5 10/11/2018   HGB 11.5 (L) 10/11/2018   HCT 35.9 (L) 10/11/2018   MCV 100.3 (H) 10/11/2018   PLT 162 10/11/2018   NEUTROABS 7.4 10/11/2018    CMP  Lab Results  Component Value Date   NA 133 (L) 09/15/2018   K 3.7 09/15/2018   CL 101 09/15/2018   CO2 22 09/15/2018   GLUCOSE 184 (H) 09/15/2018   BUN 9 09/15/2018   CREATININE 0.90 09/15/2018   CALCIUM 9.3 09/15/2018   PROT 8.5 (H) 09/15/2018   ALBUMIN 2.7 (L) 09/15/2018   AST 41 09/15/2018   ALT 10 09/15/2018   ALKPHOS 266 (H) 09/15/2018   BILITOT 0.7 09/15/2018   GFRNONAA >60 09/15/2018   GFRAA >60 09/15/2018    Lab Results  Component Value Date   CEA1 1,100.67 (H) 09/15/2018     Medications: I have reviewed the patient's current medications.   Assessment/Plan: 1. Right lower  quadrant massextensive liver metastases  CTs 04/22/2018-right lower quadrant mass associated with the ileum and cecum, hepatomegaly with extensive liver metastases, multiple lung nodules consistent with metastases  Colonoscopy 04/24/2018-multiple benign-appearing polyps removed, no colon mass  04/24/2018 chromogranin A 350  04/24/2018 CEA 632  Biopsy liver lesion 04/25/2018-adenocarcinoma with extensive necrosis; microsatellite stable; tumor mutation burden 6; KRAS A59E; PIK3CA; PTEN  Cycle 1 FOLFOX 04/28/2018  PET scan 05/10/2018-widespread bilateral hepatic metastasis. Hypermetabolic right lower quadrant mass. Hypermetabolic ileocolic mesenteric nodal metastasis.  Cycle 2 FOLFOX 05/12/2018 (5-FU dose reduced due to mucositis following cycle 1)  Cycle 3 FOLFOX 05/26/2018 (oxaliplatin dose reduced secondary to thrombocytopenia)  Cycle 4 FOLFOX 06/09/2018  Cycle 5 FOLFOX 06/23/2018  CT 07/01/2018-decreased tumor burden in the liver, decreased right abdomen mesenteric/omental implants  Cycle 6 FOLFOX 07/07/2018  Cycle 7 FOLFOX 07/21/2018  Cycle 8 FOLFOX 08/04/2018  Cycle 9 FOLFOX 08/18/2018  Cycle 10 FOLFOX 09/01/2018  CT 09/09/2018- reduction in size of pericecal implant and ileocecal lymphadenopathy.  Mild improvement in widespread hepatic metastasis.  Small subpleural right middle lobe pulmonary nodule not identified on prior.  Mild acute diverticulitis of the sigmoid colon.  Maintenance Xeloda 09/20/2018, discontinued 10/11/2018 secondary to failure to thrive and early satiety  2.Lower extremity edema secondary to massive hepatomegaly and hypoalbuminemia-resolved  3.Chronic back pain  4.Proximal leg weakness-deconditioning?, Spinal stenosis?-Improved  5.Mucositis, candidiasis 05/06/2018. Resolved following a course  of Diflucan.Repeat course of Diflucan 07/07/2018; repeat course of Diflucan 09/01/2018 and 10/11/2018    Disposition: [name] began maintenance Xeloda  on 09/20/2018.  He has developed early satiety and anorexia.  He has lost weight.  It is unclear whether his symptoms are related to the capecitabine or progression of the metastatic colon cancer.  We placed the capecitabine on hold.  The lower back pain may be related to a benign musculoskeletal condition or metastatic disease involving the spine or pelvis.  He will call for increased pain.  There was no evidence of metastatic disease to the spine on the recent images.  He will resume omeprazole.  He will complete a course of Diflucan for oral candidiasis.  [name] will return for an office visit on 10/17/2018.  Betsy Coder, MD  10/11/2018  9:22 AM

## 2018-10-11 NOTE — Telephone Encounter (Signed)
Scheduled appt per 7/21 LOS - gave patient AVS and calender per los.

## 2018-10-12 ENCOUNTER — Telehealth: Payer: Self-pay | Admitting: *Deleted

## 2018-10-12 ENCOUNTER — Other Ambulatory Visit: Payer: Self-pay | Admitting: Oncology

## 2018-10-12 DIAGNOSIS — C787 Secondary malignant neoplasm of liver and intrahepatic bile duct: Secondary | ICD-10-CM

## 2018-10-12 MED ORDER — FLUCONAZOLE 100 MG PO TABS: 100.0000 mg | ORAL_TABLET | Freq: Every day | ORAL | 0 refills | Status: DC

## 2018-10-12 MED ORDER — OXYCODONE-ACETAMINOPHEN 5-325 MG PO TABS: 1.0000 | ORAL_TABLET | ORAL | 0 refills | Status: DC | PRN

## 2018-10-12 NOTE — Telephone Encounter (Signed)
Called to f/u on pain med refill and antibiotic for thrush. MD refilled oxycodone/apap and script for fluconazole sent as well.

## 2018-10-17 ENCOUNTER — Other Ambulatory Visit: Payer: Self-pay

## 2018-10-17 ENCOUNTER — Inpatient Hospital Stay (HOSPITAL_BASED_OUTPATIENT_CLINIC_OR_DEPARTMENT_OTHER): Payer: 59 | Admitting: Oncology

## 2018-10-17 ENCOUNTER — Telehealth: Payer: Self-pay | Admitting: Oncology

## 2018-10-17 VITALS — BP 123/60 | HR 79 | Temp 98.4°F | Resp 18 | Ht 71.0 in | Wt 157.6 lb

## 2018-10-17 DIAGNOSIS — Z79899 Other long term (current) drug therapy: Secondary | ICD-10-CM

## 2018-10-17 DIAGNOSIS — C7800 Secondary malignant neoplasm of unspecified lung: Secondary | ICD-10-CM | POA: Diagnosis not present

## 2018-10-17 DIAGNOSIS — C787 Secondary malignant neoplasm of liver and intrahepatic bile duct: Secondary | ICD-10-CM

## 2018-10-17 DIAGNOSIS — R531 Weakness: Secondary | ICD-10-CM

## 2018-10-17 DIAGNOSIS — R1903 Right lower quadrant abdominal swelling, mass and lump: Secondary | ICD-10-CM

## 2018-10-17 DIAGNOSIS — M545 Low back pain: Secondary | ICD-10-CM

## 2018-10-17 DIAGNOSIS — G8929 Other chronic pain: Secondary | ICD-10-CM

## 2018-10-17 DIAGNOSIS — R630 Anorexia: Secondary | ICD-10-CM

## 2018-10-17 DIAGNOSIS — C180 Malignant neoplasm of cecum: Secondary | ICD-10-CM

## 2018-10-17 DIAGNOSIS — R110 Nausea: Secondary | ICD-10-CM

## 2018-10-17 DIAGNOSIS — B370 Candidal stomatitis: Secondary | ICD-10-CM

## 2018-10-17 DIAGNOSIS — C189 Malignant neoplasm of colon, unspecified: Secondary | ICD-10-CM

## 2018-10-17 DIAGNOSIS — Z9221 Personal history of antineoplastic chemotherapy: Secondary | ICD-10-CM | POA: Diagnosis not present

## 2018-10-17 NOTE — Telephone Encounter (Signed)
Gave avs and calendar ° °

## 2018-10-17 NOTE — Progress Notes (Signed)
[place] OFFICE PROGRESS NOTE   Diagnosis: Colon cancer  INTERVAL HISTORY:   [name] discontinue Xeloda when he was here last week.  He reports improvement in nausea and his appetite.  Thomas Riley has resolved after a course of Diflucan.  He continues to have abdomen and low back pain.  He takes oxycodone for relief of pain.  He continues to have leg "weakness ".  No difficulty with bowel or bladder function.  Objective:  Vital signs in last 24 hours:  Blood pressure 123/60, pulse 79, temperature 98.4 F (36.9 C), temperature source Oral, resp. rate 18, height _0  (1.803 m), weight 157 lb 9.6 oz (71.5 kg), SpO2 100 %.    HEENT: Mild white coat of the tongue-improved, minimal thrush of the left buccal mucosa GI: The liver is palpable throughout the abdomen Vascular: No leg edema Neuro: The leg and foot strength are intact bilaterally Musculoskeletal: No spine tenderness, the area of discomfort is at the lower lumbar/sacrum    Portacath/PICC-without erythema  Lab Results:  Lab Results  Component Value Date   WBC 9.5 10/11/2018   HGB 11.5 (L) 10/11/2018   HCT 35.9 (L) 10/11/2018   MCV 100.3 (H) 10/11/2018   PLT 162 10/11/2018   NEUTROABS 7.4 10/11/2018    CMP  Lab Results  Component Value Date   NA 137 10/11/2018   K 4.0 10/11/2018   CL 102 10/11/2018   CO2 23 10/11/2018   GLUCOSE 158 (H) 10/11/2018   BUN 12 10/11/2018   CREATININE 0.80 10/11/2018   CALCIUM 9.7 10/11/2018   PROT 8.3 (H) 10/11/2018   ALBUMIN 2.7 (L) 10/11/2018   AST 55 (H) 10/11/2018   ALT 11 10/11/2018   ALKPHOS 297 (H) 10/11/2018   BILITOT 1.1 10/11/2018   GFRNONAA >60 10/11/2018   GFRAA >60 10/11/2018    Lab Results  Component Value Date   CEA1 1,221.48 (H) 10/11/2018     Medications: I have reviewed the patient's current medications.   Assessment/Plan: 1.Right lower quadrant massextensive liver metastases  CTs 04/22/2018-right lower quadrant mass  associated with the ileum and cecum, hepatomegaly with extensive liver metastases, multiple lung nodules consistent with metastases  Colonoscopy 04/24/2018-multiple benign-appearing polyps removed, no colon mass  04/24/2018 chromogranin A 350  04/24/2018 CEA 632  Biopsy liver lesion 04/25/2018-adenocarcinoma with extensive necrosis; microsatellite stable; tumor mutation burden 6; KRAS A59E; PIK3CA; PTEN  Cycle 1 FOLFOX 04/28/2018  PET scan 05/10/2018-widespread bilateral hepatic metastasis. Hypermetabolic right lower quadrant mass. Hypermetabolic ileocolic mesenteric nodal metastasis.  Cycle 2 FOLFOX 05/12/2018 (5-FU dose reduced due to mucositis following cycle 1)  Cycle 3 FOLFOX 05/26/2018 (oxaliplatin dose reduced secondary to thrombocytopenia)  Cycle 4 FOLFOX 06/09/2018  Cycle 5 FOLFOX 06/23/2018  CT 07/01/2018-decreased tumor burden in the liver, decreased right abdomen mesenteric/omental implants  Cycle 6 FOLFOX 07/07/2018  Cycle 7 FOLFOX 07/21/2018  Cycle 8 FOLFOX 08/04/2018  Cycle 9 FOLFOX 08/18/2018  Cycle 10 FOLFOX 09/01/2018  CT 09/09/2018- reduction in size of pericecal implant and ileocecal lymphadenopathy.  Mild improvement in widespread hepatic metastasis.  Small subpleural right middle lobe pulmonary nodule not identified on prior.  Mild acute diverticulitis of the sigmoid colon.  Maintenance Xeloda 09/20/2018, discontinued 10/11/2018 secondary to failure to thrive and early satiety  Xeloda resumed at a reduced dose 10/17/2018  2.Lower extremity edema secondary to massive hepatomegaly and hypoalbuminemia-resolved  3.Chronic back pain  4.Proximal leg weakness-deconditioning?, Spinal stenosis?-Improved  5.Mucositis, candidiasis 05/06/2018. Resolved following a course of Diflucan.Repeat course of Diflucan  07/07/2018; repeat course of Diflucan 09/01/2018 and 10/11/2018     Disposition: [name] has an improved performance status today.  Is unclear whether  his symptoms last week are related to the underlying colon cancer, Xeloda, or another etiology.  We discussed treatment options.  He agrees to resuming Xeloda at a reduced dose.  He will contact us for recurrent symptoms.  The back pain is most likely related to benign musculoskeletal pain.  We will refer him for an MRI of the lumbar spine if the pain persists.  [name] will return for an office visit on 10/26/2018.  Betsy Coder, MD  10/17/2018  9:35 AM

## 2018-10-18 ENCOUNTER — Telehealth: Payer: Self-pay | Admitting: *Deleted

## 2018-10-18 NOTE — Telephone Encounter (Addendum)
Message from [facility] that wife called last night w/reports of patient having shaking chills, hands draw up, and trouble walking. Was instructed to go to ER, but this did not occur. Called wife and left VM requesting return call w/update.  Per wife, he is doing fine today. He does have episodes of feeling chilled at times. Last night he was shaking feeling cold and his arms and hands felt as if they were drawing up as if having a spasm. Had no temp or s/s of infection. Asking what this could have been?

## 2018-10-21 ENCOUNTER — Other Ambulatory Visit: Payer: Self-pay | Admitting: *Deleted

## 2018-10-21 MED ORDER — CAPECITABINE 500 MG PO TABS: ORAL_TABLET | ORAL | 0 refills | Status: DC

## 2018-10-21 NOTE — Telephone Encounter (Signed)
VM requesting new Xeloda script be sent in with new dosing directions. Script e-scribed as requested.

## 2018-10-22 ENCOUNTER — Other Ambulatory Visit: Payer: Self-pay | Admitting: Oncology

## 2018-10-22 DIAGNOSIS — C787 Secondary malignant neoplasm of liver and intrahepatic bile duct: Secondary | ICD-10-CM

## 2018-10-22 DIAGNOSIS — C189 Malignant neoplasm of colon, unspecified: Secondary | ICD-10-CM

## 2018-10-26 ENCOUNTER — Telehealth: Payer: Self-pay | Admitting: Nurse Practitioner

## 2018-10-26 ENCOUNTER — Inpatient Hospital Stay: Payer: 59 | Attending: Nurse Practitioner

## 2018-10-26 ENCOUNTER — Inpatient Hospital Stay (HOSPITAL_BASED_OUTPATIENT_CLINIC_OR_DEPARTMENT_OTHER): Payer: 59 | Admitting: Nurse Practitioner

## 2018-10-26 ENCOUNTER — Other Ambulatory Visit: Payer: Self-pay

## 2018-10-26 ENCOUNTER — Encounter: Payer: Self-pay | Admitting: Nurse Practitioner

## 2018-10-26 VITALS — BP 117/74 | HR 100 | Temp 98.3°F | Resp 16 | Ht 71.0 in | Wt 159.6 lb

## 2018-10-26 DIAGNOSIS — C189 Malignant neoplasm of colon, unspecified: Secondary | ICD-10-CM | POA: Diagnosis not present

## 2018-10-26 DIAGNOSIS — R600 Localized edema: Secondary | ICD-10-CM | POA: Insufficient documentation

## 2018-10-26 DIAGNOSIS — Z5112 Encounter for antineoplastic immunotherapy: Secondary | ICD-10-CM | POA: Diagnosis not present

## 2018-10-26 DIAGNOSIS — Z95828 Presence of other vascular implants and grafts: Secondary | ICD-10-CM

## 2018-10-26 DIAGNOSIS — C180 Malignant neoplasm of cecum: Secondary | ICD-10-CM | POA: Diagnosis present

## 2018-10-26 DIAGNOSIS — L539 Erythematous condition, unspecified: Secondary | ICD-10-CM | POA: Diagnosis not present

## 2018-10-26 DIAGNOSIS — R531 Weakness: Secondary | ICD-10-CM | POA: Diagnosis not present

## 2018-10-26 DIAGNOSIS — R0600 Dyspnea, unspecified: Secondary | ICD-10-CM | POA: Insufficient documentation

## 2018-10-26 DIAGNOSIS — M549 Dorsalgia, unspecified: Secondary | ICD-10-CM | POA: Insufficient documentation

## 2018-10-26 DIAGNOSIS — Z79899 Other long term (current) drug therapy: Secondary | ICD-10-CM | POA: Insufficient documentation

## 2018-10-26 DIAGNOSIS — Z5111 Encounter for antineoplastic chemotherapy: Secondary | ICD-10-CM | POA: Insufficient documentation

## 2018-10-26 DIAGNOSIS — C787 Secondary malignant neoplasm of liver and intrahepatic bile duct: Secondary | ICD-10-CM | POA: Insufficient documentation

## 2018-10-26 DIAGNOSIS — R6883 Chills (without fever): Secondary | ICD-10-CM | POA: Diagnosis not present

## 2018-10-26 DIAGNOSIS — R630 Anorexia: Secondary | ICD-10-CM | POA: Diagnosis not present

## 2018-10-26 DIAGNOSIS — Z452 Encounter for adjustment and management of vascular access device: Secondary | ICD-10-CM | POA: Diagnosis not present

## 2018-10-26 DIAGNOSIS — C7800 Secondary malignant neoplasm of unspecified lung: Secondary | ICD-10-CM | POA: Diagnosis not present

## 2018-10-26 LAB — CMP (CANCER CENTER ONLY)
ALT: 14 U/L (ref 0–44)
AST: 63 U/L — ABNORMAL HIGH (ref 15–41)
Albumin: 2.4 g/dL — ABNORMAL LOW (ref 3.5–5.0)
Alkaline Phosphatase: 353 U/L — ABNORMAL HIGH (ref 38–126)
Anion gap: 13 (ref 5–15)
BUN: 12 mg/dL (ref 8–23)
CO2: 21 mmol/L — ABNORMAL LOW (ref 22–32)
Calcium: 10.1 mg/dL (ref 8.9–10.3)
Chloride: 101 mmol/L (ref 98–111)
Creatinine: 0.77 mg/dL (ref 0.61–1.24)
GFR, Est AFR Am: >60 mL/min (ref >60)
GFR, Estimated: >60 mL/min (ref >60)
Glucose, Bld: 158 mg/dL — ABNORMAL HIGH (ref 70–99)
Potassium: 4 mmol/L (ref 3.5–5.1)
Sodium: 135 mmol/L (ref 135–145)
Total Bilirubin: 1.2 mg/dL (ref 0.3–1.2)
Total Protein: 7.8 g/dL (ref 6.5–8.1)

## 2018-10-26 LAB — CBC WITH DIFFERENTIAL (CANCER CENTER ONLY)
Abs Immature Granulocytes: 0.12 10*3/uL — ABNORMAL HIGH (ref 0.00–0.07)
Basophils Absolute: 0 10*3/uL (ref 0.0–0.1)
Basophils Relative: 0 %
Eosinophils Absolute: 0 10*3/uL (ref 0.0–0.5)
Eosinophils Relative: 0 %
HCT: 33.9 % — ABNORMAL LOW (ref 39.0–52.0)
Hemoglobin: 11.2 g/dL — ABNORMAL LOW (ref 13.0–17.0)
Immature Granulocytes: 1 %
Lymphocytes Relative: 8 %
Lymphs Abs: 0.8 10*3/uL (ref 0.7–4.0)
MCH: 33.8 pg (ref 26.0–34.0)
MCHC: 33 g/dL (ref 30.0–36.0)
MCV: 102.4 fL — ABNORMAL HIGH (ref 80.0–100.0)
Monocytes Absolute: 1 10*3/uL (ref 0.1–1.0)
Monocytes Relative: 10 %
Neutro Abs: 8.4 10*3/uL — ABNORMAL HIGH (ref 1.7–7.7)
Neutrophils Relative %: 81 %
Platelet Count: 154 10*3/uL (ref 150–400)
RBC: 3.31 MIL/uL — ABNORMAL LOW (ref 4.22–5.81)
RDW: 25 % — ABNORMAL HIGH (ref 11.5–15.5)
WBC Count: 10.4 10*3/uL (ref 4.0–10.5)
nRBC: 0 % (ref 0.0–0.2)

## 2018-10-26 LAB — CEA (IN HOUSE-CHCC): CEA (CHCC-In House): 1431.81 ng/mL — ABNORMAL HIGH (ref 0.00–5.00)

## 2018-10-26 MED ORDER — SODIUM CHLORIDE 0.9% FLUSH
10.0000 mL | INTRAVENOUS | Status: DC | PRN
  Administered 2018-10-26: 10 mL
  Filled 2018-10-26: qty 10

## 2018-10-26 MED ORDER — OXYCODONE-ACETAMINOPHEN 5-325 MG PO TABS: 1.0000 | ORAL_TABLET | ORAL | 0 refills | Status: DC | PRN

## 2018-10-26 MED ORDER — HEPARIN SOD (PORK) LOCK FLUSH 100 UNIT/ML IV SOLN
500.0000 [IU] | Freq: Once | INTRAVENOUS | Status: AC | PRN
  Administered 2018-10-26: 09:00:00 500 [IU]
  Filled 2018-10-26: qty 5

## 2018-10-26 NOTE — Telephone Encounter (Signed)
Scheduled appt per 8/5 los. ° °Printed calendar and avs. °

## 2018-10-26 NOTE — Progress Notes (Signed)
[place] OFFICE PROGRESS NOTE   Diagnosis: Colon cancer  INTERVAL HISTORY:   Thomas Riley returns as scheduled.  He resume Xeloda at a reduced dose following his office visit 10/17/2018.  He denies nausea/vomiting.  No mouth sores.  No diarrhea.  No hand or foot pain or redness.  He has gained some weight.  His appetite remains poor overall.  He has mild intermittent dyspnea.  No cough.  He reports 2 separate episodes of "chills".  He had associated "shivering".  None recent.  No fever.  Objective:  Vital signs in last 24 hours:  Blood pressure 117/74, pulse 100, temperature 98.3 F (36.8 C), temperature source Oral, resp. rate 16, height '5\' 11"'$  (1.803 m), weight 159 lb 9.6 oz (72.4 kg), SpO2 99 %.    HEENT: Mild white coating over tongue.  No buccal thrush.  No ulcers. GI: Liver palpable throughout the abdomen with associated tenderness. Vascular: No leg edema. Neuro: Alert and oriented. Skin: Palms with mild erythema, dryness.  No skin breakdown. Port-A-Cath without erythema.   Lab Results:  Lab Results  Component Value Date   WBC 10.4 10/26/2018   HGB 11.2 (L) 10/26/2018   HCT 33.9 (L) 10/26/2018   MCV 102.4 (H) 10/26/2018   PLT 154 10/26/2018   NEUTROABS 8.4 (H) 10/26/2018    Imaging:  No results found.  Medications: I have reviewed the patient's current medications.  Assessment/Plan: 1.Right lower quadrant massextensive liver metastases  CTs 04/22/2018-right lower quadrant mass associated with the ileum and cecum, hepatomegaly with extensive liver metastases, multiple lung nodules consistent with metastases  Colonoscopy 04/24/2018-multiple benign-appearing polyps removed, no colon mass  04/24/2018 chromogranin A 350  04/24/2018 CEA 632  Biopsy liver lesion 04/25/2018-adenocarcinoma with extensive necrosis;microsatellite stable; tumor mutation burden 6; KRAS A59E;PIK3CA; PTEN  Cycle 1 FOLFOX 04/28/2018  PET scan 05/10/2018-widespread  bilateral hepatic metastasis. Hypermetabolic right lower quadrant mass. Hypermetabolic ileocolic mesenteric nodal metastasis.  Cycle 2 FOLFOX 05/12/2018 (5-FU dose reduced due to mucositis following cycle 1)  Cycle 3 FOLFOX 05/26/2018 (oxaliplatin dose reduced secondary to thrombocytopenia)  Cycle 4 FOLFOX 06/09/2018  Cycle 5 FOLFOX 06/23/2018  CT 07/01/2018-decreased tumor burden in the liver, decreased right abdomen mesenteric/omental implants  Cycle 6 FOLFOX 07/07/2018  Cycle 7 FOLFOX 07/21/2018  Cycle 8 FOLFOX 08/04/2018  Cycle 9 FOLFOX 08/18/2018  Cycle 10 FOLFOX 09/01/2018  CT 09/09/2018- reduction in size of pericecal implant and ileocecal lymphadenopathy.  Mild improvement in widespread hepatic metastasis.  Small subpleural right middle lobe pulmonary nodule not identified on prior.  Mild acute diverticulitis of the sigmoid colon.  Maintenance Xeloda 09/20/2018, discontinued 10/11/2018 secondary to failure to thrive and early satiety  Xeloda resumed at a reduced dose 10/17/2018  2.Lower extremity edema secondary to massive hepatomegaly and hypoalbuminemia-resolved  3.Chronic back pain  4.Proximal leg weakness-deconditioning?, Spinal stenosis?-Improved  5.Mucositis, candidiasis 05/06/2018. Resolved following a course of Diflucan.Repeat course of Diflucan 07/07/2018; repeat course of Diflucan 09/01/2018 and 10/11/2018    Disposition: Thomas Riley appears stable.  He is tolerating Xeloda better at the reduced dose.  He will continue the same.  We will follow-up on the CEA from today.  We reviewed the CBC from today.  Counts adequate to continue with Xeloda.  He will return for lab and follow-up in 2 weeks.  He will contact the office in the interim with any problems.  Plan reviewed with Dr. Benay Spice.    [name] ANP/GNP-BC   10/26/2018  9:07 AM

## 2018-11-07 ENCOUNTER — Other Ambulatory Visit: Payer: Self-pay | Admitting: *Deleted

## 2018-11-07 ENCOUNTER — Other Ambulatory Visit: Payer: Self-pay | Admitting: Medical

## 2018-11-07 DIAGNOSIS — C787 Secondary malignant neoplasm of liver and intrahepatic bile duct: Secondary | ICD-10-CM

## 2018-11-07 MED ORDER — OXYCODONE-ACETAMINOPHEN 5-325 MG PO TABS: 1.0000 | ORAL_TABLET | ORAL | 0 refills | Status: DC | PRN

## 2018-11-07 NOTE — Telephone Encounter (Signed)
Received call from pt's wife requesting refill on pain medication.  Last filled 8/5.  Dr. Benay Spice & Lattie Haw on PAL.  Sandi Mealy, PA to refill rx.  Pt informed.

## 2018-11-09 ENCOUNTER — Inpatient Hospital Stay: Payer: 59

## 2018-11-09 ENCOUNTER — Telehealth: Payer: Self-pay | Admitting: *Deleted

## 2018-11-09 ENCOUNTER — Inpatient Hospital Stay (HOSPITAL_BASED_OUTPATIENT_CLINIC_OR_DEPARTMENT_OTHER): Payer: 59 | Admitting: Oncology

## 2018-11-09 ENCOUNTER — Telehealth: Payer: Self-pay | Admitting: Oncology

## 2018-11-09 ENCOUNTER — Other Ambulatory Visit: Payer: Self-pay

## 2018-11-09 VITALS — BP 114/61 | HR 117 | Temp 98.5°F | Resp 18 | Ht 71.0 in | Wt 163.0 lb

## 2018-11-09 DIAGNOSIS — C180 Malignant neoplasm of cecum: Secondary | ICD-10-CM | POA: Diagnosis not present

## 2018-11-09 DIAGNOSIS — Z95828 Presence of other vascular implants and grafts: Secondary | ICD-10-CM

## 2018-11-09 DIAGNOSIS — C787 Secondary malignant neoplasm of liver and intrahepatic bile duct: Secondary | ICD-10-CM | POA: Diagnosis not present

## 2018-11-09 DIAGNOSIS — C189 Malignant neoplasm of colon, unspecified: Secondary | ICD-10-CM

## 2018-11-09 LAB — CBC WITH DIFFERENTIAL (CANCER CENTER ONLY)
Abs Immature Granulocytes: 0.13 10*3/uL — ABNORMAL HIGH (ref 0.00–0.07)
Basophils Absolute: 0 10*3/uL (ref 0.0–0.1)
Basophils Relative: 0 %
Eosinophils Absolute: 0 10*3/uL (ref 0.0–0.5)
Eosinophils Relative: 0 %
HCT: 34.4 % — ABNORMAL LOW (ref 39.0–52.0)
Hemoglobin: 11.4 g/dL — ABNORMAL LOW (ref 13.0–17.0)
Immature Granulocytes: 1 %
Lymphocytes Relative: 10 %
Lymphs Abs: 1.1 10*3/uL (ref 0.7–4.0)
MCH: 35.2 pg — ABNORMAL HIGH (ref 26.0–34.0)
MCHC: 33.1 g/dL (ref 30.0–36.0)
MCV: 106.2 fL — ABNORMAL HIGH (ref 80.0–100.0)
Monocytes Absolute: 1.2 10*3/uL — ABNORMAL HIGH (ref 0.1–1.0)
Monocytes Relative: 11 %
Neutro Abs: 8.9 10*3/uL — ABNORMAL HIGH (ref 1.7–7.7)
Neutrophils Relative %: 78 %
Platelet Count: 200 10*3/uL (ref 150–400)
RBC: 3.24 MIL/uL — ABNORMAL LOW (ref 4.22–5.81)
RDW: 25.6 % — ABNORMAL HIGH (ref 11.5–15.5)
WBC Count: 11.4 10*3/uL — ABNORMAL HIGH (ref 4.0–10.5)
nRBC: 0 % (ref 0.0–0.2)

## 2018-11-09 LAB — CEA (IN HOUSE-CHCC): CEA (CHCC-In House): 1322.8 ng/mL — ABNORMAL HIGH (ref 0.00–5.00)

## 2018-11-09 LAB — CMP (CANCER CENTER ONLY)
ALT: 18 U/L (ref 0–44)
AST: 105 U/L — ABNORMAL HIGH (ref 15–41)
Albumin: 2.5 g/dL — ABNORMAL LOW (ref 3.5–5.0)
Alkaline Phosphatase: 546 U/L — ABNORMAL HIGH (ref 38–126)
Anion gap: 14 (ref 5–15)
BUN: 18 mg/dL (ref 8–23)
CO2: 20 mmol/L — ABNORMAL LOW (ref 22–32)
Calcium: 10.1 mg/dL (ref 8.9–10.3)
Chloride: 99 mmol/L (ref 98–111)
Creatinine: 0.82 mg/dL (ref 0.61–1.24)
GFR, Est AFR Am: >60 mL/min (ref >60)
GFR, Estimated: >60 mL/min (ref >60)
Glucose, Bld: 99 mg/dL (ref 70–99)
Potassium: 5.3 mmol/L — ABNORMAL HIGH (ref 3.5–5.1)
Sodium: 133 mmol/L — ABNORMAL LOW (ref 135–145)
Total Bilirubin: 2.2 mg/dL — ABNORMAL HIGH (ref 0.3–1.2)
Total Protein: 8 g/dL (ref 6.5–8.1)

## 2018-11-09 MED ORDER — HEPARIN SOD (PORK) LOCK FLUSH 100 UNIT/ML IV SOLN
500.0000 [IU] | Freq: Once | INTRAVENOUS | Status: AC | PRN
  Administered 2018-11-09: 13:00:00 500 [IU]
  Filled 2018-11-09: qty 5

## 2018-11-09 MED ORDER — SODIUM CHLORIDE 0.9% FLUSH
10.0000 mL | INTRAVENOUS | Status: DC | PRN
  Administered 2018-11-09: 10 mL
  Filled 2018-11-09: qty 10

## 2018-11-09 NOTE — Telephone Encounter (Signed)
Called wife for CT appointment on 11/15/18 at 1145/1200. Drink contrast at 1000 and 1100 and NPO 4 hours prior. Scheduler will call to move the lab/flush to 8/25 prior to scan. Scheduling message sent.

## 2018-11-09 NOTE — Progress Notes (Signed)
[place] OFFICE PROGRESS NOTE   Diagnosis: Colon cancer  INTERVAL HISTORY:   [name] returns as scheduled.  He continues Xeloda.  He reports a poor appetite.  No mouth sores or diarrhea.  He continues to have abdominal and back pain.  He has developed increased leg and foot swelling.  Objective:  Vital signs in last 24 hours:  Blood pressure 114/61, pulse (!) 117, temperature 98.5 F (36.9 C), temperature source Oral, resp. rate 18, height '5\' 11"'$  (1.803 m), weight 163 lb (73.9 kg), SpO2 98 %.    HEENT: No thrush or ulcers Resp: Lungs clear bilaterally Cardio: Tachycardia, regular rate and rhythm GI: The liver is palpable throughout the upper abdomen Vascular: Pitting edema at the lower leg and foot bilaterally    Portacath/PICC-without erythema  Lab Results:  Lab Results  Component Value Date   WBC 11.4 (H) 11/09/2018   HGB 11.4 (L) 11/09/2018   HCT 34.4 (L) 11/09/2018   MCV 106.2 (H) 11/09/2018   PLT 200 11/09/2018   NEUTROABS 8.9 (H) 11/09/2018    CMP  Lab Results  Component Value Date   NA 133 (L) 11/09/2018   K 5.3 (H) 11/09/2018   CL 99 11/09/2018   CO2 20 (L) 11/09/2018   GLUCOSE 99 11/09/2018   BUN 18 11/09/2018   CREATININE 0.82 11/09/2018   CALCIUM 10.1 11/09/2018   PROT 8.0 11/09/2018   ALBUMIN 2.5 (L) 11/09/2018   AST 105 (H) 11/09/2018   ALT 18 11/09/2018   ALKPHOS 546 (H) 11/09/2018   BILITOT 2.2 (H) 11/09/2018   GFRNONAA >60 11/09/2018   GFRAA >60 11/09/2018    Lab Results  Component Value Date   CEA1 1,431.81 (H) 10/26/2018     Medications: I have reviewed the patient's current medications.   Assessment/Plan: 1.Right lower quadrant massextensive liver metastases  CTs 04/22/2018-right lower quadrant mass associated with the ileum and cecum, hepatomegaly with extensive liver metastases, multiple lung nodules consistent with metastases  Colonoscopy 04/24/2018-multiple benign-appearing polyps removed, no  colon mass  04/24/2018 chromogranin A 350  04/24/2018 CEA 632  Biopsy liver lesion 04/25/2018-adenocarcinoma with extensive necrosis;microsatellite stable; tumor mutation burden 6; KRAS A59E;PIK3CA; PTEN  Cycle 1 FOLFOX 04/28/2018  PET scan 05/10/2018-widespread bilateral hepatic metastasis. Hypermetabolic right lower quadrant mass. Hypermetabolic ileocolic mesenteric nodal metastasis.  Cycle 2 FOLFOX 05/12/2018 (5-FU dose reduced due to mucositis following cycle 1)  Cycle 3 FOLFOX 05/26/2018 (oxaliplatin dose reduced secondary to thrombocytopenia)  Cycle 4 FOLFOX 06/09/2018  Cycle 5 FOLFOX 06/23/2018  CT 07/01/2018-decreased tumor burden in the liver, decreased right abdomen mesenteric/omental implants  Cycle 6 FOLFOX 07/07/2018  Cycle 7 FOLFOX 07/21/2018  Cycle 8 FOLFOX 08/04/2018  Cycle 9 FOLFOX 08/18/2018  Cycle 10 FOLFOX 09/01/2018  CT 09/09/2018- reduction in size of pericecal implant and ileocecal lymphadenopathy.  Mild improvement in widespread hepatic metastasis.  Small subpleural right middle lobe pulmonary nodule not identified on prior.  Mild acute diverticulitis of the sigmoid colon.  Maintenance Xeloda 09/20/2018, discontinued 10/11/2018 secondary to failure to thrive and early satiety  Xeloda resumed at a reduced dose 10/17/2018  Xeloda discontinued 11/09/2018 secondary to clinical evidence of disease progression  2.Lower extremity edema secondary to massive hepatomegaly and hypoalbuminemia-resolved  3.Chronic back pain  4.Proximal leg weakness-deconditioning?, Spinal stenosis?-Improved  5.Mucositis, candidiasis 05/06/2018. Resolved following a course of Diflucan.Repeat course of Diflucan 07/07/2018; repeat course of Diflucan 09/01/2018 and 10/11/2018      Disposition: [name] is currently taking maintenance Xeloda.  There is no  clinical evidence of disease progression.  He will discontinue Xeloda.  He will be referred for a restaging CT evaluation  within the next few days.  I recommend changing treatment to FOLFIRI/Avastin. I reviewed potential toxicities associated with the FOLFIRI regimen including the chance for nausea/vomiting, alopecia, diarrhea, and hematologic toxicity.  We discussed the allergic reaction, hypertension, bleeding, thromboembolic disease, bowel perforation, and delayed wound healing associated with Avastin.  He agrees to proceed.  He was given written materials on irinotecan and Avastin today.  [name] will be scheduled for cycle 1 FOLFIRI/Avastin and an office visit on 11/17/2018.  The potassium is mildly high today.  He will discontinue the potassium supplement.  We will check a chemistry panel when he returns for the CT scan.  25 minutes were spent with the patient today.  The majority of the time was used for counseling and coordination of care.  Betsy Coder, MD  11/09/2018  2:29 PM

## 2018-11-09 NOTE — Telephone Encounter (Signed)
Waiting to see if I can leave appts as they are

## 2018-11-09 NOTE — Progress Notes (Signed)
Provided printed and verbal information on irinotecan and bevacizumab along with specific directions for taking Imodium AD for late diarrhea.

## 2018-11-09 NOTE — Addendum Note (Signed)
Addended by: Tania Ade on: 11/09/2018 02:52 PM   Modules accepted: Orders

## 2018-11-09 NOTE — Progress Notes (Signed)
DISCONTINUE ON PATHWAY REGIMEN - Colorectal     A cycle is every 14 days:     Oxaliplatin      Leucovorin      5-Fluorouracil      5-Fluorouracil      Bevacizumab-xxxx   **Always confirm dose/schedule in your pharmacy ordering system**  REASON: Disease Progression PRIOR TREATMENT: [id number]: [id number] + Bevacizumab TREATMENT RESPONSE: Partial Response (PR)  START ON PATHWAY REGIMEN - Colorectal     A cycle is every 14 days:     Bevacizumab-xxxx      Irinotecan      Leucovorin      Fluorouracil      Fluorouracil   **Always confirm dose/schedule in your pharmacy ordering system**  Patient Characteristics: Distant Metastases, Nonsurgical Candidate, KRAS/NRAS Mutation Positive/Unknown (BRAF V600 Wild-Type/Unknown), Standard Cytotoxic Therapy, Second Line Standard Cytotoxic Therapy, Bevacizumab Eligible Tumor Location: Colon Therapeutic Status: Distant Metastases Microsatellite/Mismatch Repair Status: MSS/pMMR BRAF Mutation Status: Wild-Type (no mutation) KRAS/NRAS Mutation Status: Mutation Positive Standard Cytotoxic Line of Therapy: Second Line Standard Cytotoxic Therapy Bevacizumab Eligibility: Eligible Intent of Therapy: Non-Curative / Palliative Intent, Discussed with Patient 

## 2018-11-09 NOTE — Patient Instructions (Signed)

## 2018-11-10 ENCOUNTER — Telehealth: Payer: Self-pay | Admitting: Oncology

## 2018-11-10 NOTE — Telephone Encounter (Signed)
I talk with patient regarding schedule  

## 2018-11-11 ENCOUNTER — Other Ambulatory Visit: Payer: 59

## 2018-11-11 ENCOUNTER — Telehealth: Payer: Self-pay | Admitting: *Deleted

## 2018-11-11 ENCOUNTER — Other Ambulatory Visit: Payer: Self-pay | Admitting: Oncology

## 2018-11-11 DIAGNOSIS — C787 Secondary malignant neoplasm of liver and intrahepatic bile duct: Secondary | ICD-10-CM

## 2018-11-11 MED ORDER — OXYCODONE-ACETAMINOPHEN 5-325 MG PO TABS: 1.0000 | ORAL_TABLET | ORAL | 0 refills | Status: DC | PRN

## 2018-11-11 NOTE — Telephone Encounter (Signed)
Received call from pt's wife requesting refill on pt's percocet 5/325. It was last filled on 11/07/18 for # 60 tabs.  Spoke with patient -he states he uses about 10-12 tablets a day. He states he has about 10 left.  He is afraid to run out of his pain meds over the weekend. He states his abd pain has been pretty bad this week-especially at night. Asked patient about his bowel habits.  He states he had a BM yesterday and is taking miralax every day. He uses [facility] on [address].

## 2018-11-13 ENCOUNTER — Other Ambulatory Visit: Payer: Self-pay | Admitting: Oncology

## 2018-11-14 NOTE — Progress Notes (Signed)
Inj Bevacizumab-awwb Biosimlr         [id number] Approved  The following biosimilar Mvasi (bevacizumab-awwb) has been selected for use in this patient.  Kennith Center, Pharm.D., CPP 11/14/2018@12 :06 PM

## 2018-11-15 ENCOUNTER — Encounter (HOSPITAL_COMMUNITY): Payer: Self-pay

## 2018-11-15 ENCOUNTER — Other Ambulatory Visit: Payer: Self-pay

## 2018-11-15 ENCOUNTER — Inpatient Hospital Stay: Payer: 59

## 2018-11-15 ENCOUNTER — Ambulatory Visit (HOSPITAL_COMMUNITY)
Admission: RE | Admit: 2018-11-15 | Discharge: 2018-11-15 | Disposition: A | Discharge status: Home / Self Care | Payer: 59 | Source: Ambulatory Visit | Attending: Oncology | Admitting: Oncology

## 2018-11-15 DIAGNOSIS — C787 Secondary malignant neoplasm of liver and intrahepatic bile duct: Secondary | ICD-10-CM

## 2018-11-15 DIAGNOSIS — C180 Malignant neoplasm of cecum: Secondary | ICD-10-CM | POA: Diagnosis not present

## 2018-11-15 DIAGNOSIS — Z95828 Presence of other vascular implants and grafts: Secondary | ICD-10-CM

## 2018-11-15 HISTORY — DX: Malignant (primary) neoplasm, unspecified: C80.1

## 2018-11-15 LAB — CMP (CANCER CENTER ONLY)
ALT: 26 U/L (ref 0–44)
AST: 160 U/L — ABNORMAL HIGH (ref 15–41)
Albumin: 2.5 g/dL — ABNORMAL LOW (ref 3.5–5.0)
Alkaline Phosphatase: 702 U/L — ABNORMAL HIGH (ref 38–126)
Anion gap: 13 (ref 5–15)
BUN: 25 mg/dL — ABNORMAL HIGH (ref 8–23)
CO2: 21 mmol/L — ABNORMAL LOW (ref 22–32)
Calcium: 10.2 mg/dL (ref 8.9–10.3)
Chloride: 96 mmol/L — ABNORMAL LOW (ref 98–111)
Creatinine: 1.22 mg/dL (ref 0.61–1.24)
GFR, Est AFR Am: >60 mL/min (ref >60)
GFR, Estimated: >60 mL/min (ref >60)
Glucose, Bld: 91 mg/dL (ref 70–99)
Potassium: 5.3 mmol/L — ABNORMAL HIGH (ref 3.5–5.1)
Sodium: 130 mmol/L — ABNORMAL LOW (ref 135–145)
Total Bilirubin: 2.3 mg/dL — ABNORMAL HIGH (ref 0.3–1.2)
Total Protein: 8 g/dL (ref 6.5–8.1)

## 2018-11-15 LAB — CBC WITH DIFFERENTIAL (CANCER CENTER ONLY)
Abs Immature Granulocytes: 0.19 10*3/uL — ABNORMAL HIGH (ref 0.00–0.07)
Basophils Absolute: 0 10*3/uL (ref 0.0–0.1)
Basophils Relative: 0 %
Eosinophils Absolute: 0 10*3/uL (ref 0.0–0.5)
Eosinophils Relative: 0 %
HCT: 34.4 % — ABNORMAL LOW (ref 39.0–52.0)
Hemoglobin: 11.5 g/dL — ABNORMAL LOW (ref 13.0–17.0)
Immature Granulocytes: 2 %
Lymphocytes Relative: 8 %
Lymphs Abs: 1 10*3/uL (ref 0.7–4.0)
MCH: 35.1 pg — ABNORMAL HIGH (ref 26.0–34.0)
MCHC: 33.4 g/dL (ref 30.0–36.0)
MCV: 104.9 fL — ABNORMAL HIGH (ref 80.0–100.0)
Monocytes Absolute: 1.3 10*3/uL — ABNORMAL HIGH (ref 0.1–1.0)
Monocytes Relative: 11 %
Neutro Abs: 9.3 10*3/uL — ABNORMAL HIGH (ref 1.7–7.7)
Neutrophils Relative %: 79 %
Platelet Count: 176 10*3/uL (ref 150–400)
RBC: 3.28 MIL/uL — ABNORMAL LOW (ref 4.22–5.81)
WBC Count: 11.9 10*3/uL — ABNORMAL HIGH (ref 4.0–10.5)
nRBC: 0 % (ref 0.0–0.2)

## 2018-11-15 LAB — CEA (IN HOUSE-CHCC): CEA (CHCC-In House): 1821.02 ng/mL — ABNORMAL HIGH (ref 0.00–5.00)

## 2018-11-15 MED ORDER — IOHEXOL 300 MG/ML  SOLN
100.0000 mL | Freq: Once | INTRAMUSCULAR | Status: AC | PRN
  Administered 2018-11-15: 100 mL via INTRAVENOUS

## 2018-11-15 MED ORDER — HEPARIN SOD (PORK) LOCK FLUSH 100 UNIT/ML IV SOLN
500.0000 [IU] | Freq: Once | INTRAVENOUS | Status: AC
  Administered 2018-11-15: 500 [IU] via INTRAVENOUS

## 2018-11-15 MED ORDER — HEPARIN SOD (PORK) LOCK FLUSH 100 UNIT/ML IV SOLN
INTRAVENOUS | Status: AC
  Filled 2018-11-15: qty 5

## 2018-11-15 MED ORDER — SODIUM CHLORIDE (PF) 0.9 % IJ SOLN
INTRAMUSCULAR | Status: AC
  Filled 2018-11-15: qty 50

## 2018-11-15 MED ORDER — SODIUM CHLORIDE 0.9% FLUSH
10.0000 mL | INTRAVENOUS | Status: DC | PRN
  Administered 2018-11-15: 10 mL
  Filled 2018-11-15: qty 10

## 2018-11-16 ENCOUNTER — Inpatient Hospital Stay: Payer: 59

## 2018-11-16 ENCOUNTER — Ambulatory Visit: Payer: 59

## 2018-11-16 ENCOUNTER — Encounter: Payer: Self-pay | Admitting: Nurse Practitioner

## 2018-11-16 ENCOUNTER — Other Ambulatory Visit: Payer: Self-pay

## 2018-11-16 ENCOUNTER — Inpatient Hospital Stay (HOSPITAL_BASED_OUTPATIENT_CLINIC_OR_DEPARTMENT_OTHER): Payer: 59 | Admitting: Nurse Practitioner

## 2018-11-16 VITALS — BP 118/70 | HR 103

## 2018-11-16 VITALS — BP 119/67 | HR 103 | Temp 98.2°F | Resp 17 | Ht 71.0 in | Wt 166.3 lb

## 2018-11-16 DIAGNOSIS — C787 Secondary malignant neoplasm of liver and intrahepatic bile duct: Secondary | ICD-10-CM

## 2018-11-16 DIAGNOSIS — C189 Malignant neoplasm of colon, unspecified: Secondary | ICD-10-CM

## 2018-11-16 DIAGNOSIS — C180 Malignant neoplasm of cecum: Secondary | ICD-10-CM | POA: Diagnosis not present

## 2018-11-16 LAB — CMP (CANCER CENTER ONLY)
ALT: 31 U/L (ref 0–44)
AST: 169 U/L — ABNORMAL HIGH (ref 15–41)
Albumin: 2.5 g/dL — ABNORMAL LOW (ref 3.5–5.0)
Alkaline Phosphatase: 660 U/L — ABNORMAL HIGH (ref 38–126)
Anion gap: 15 (ref 5–15)
BUN: 30 mg/dL — ABNORMAL HIGH (ref 8–23)
CO2: 20 mmol/L — ABNORMAL LOW (ref 22–32)
Calcium: 9.8 mg/dL (ref 8.9–10.3)
Chloride: 95 mmol/L — ABNORMAL LOW (ref 98–111)
Creatinine: 1.4 mg/dL — ABNORMAL HIGH (ref 0.61–1.24)
GFR, Est AFR Am: >60 mL/min (ref >60)
GFR, Estimated: 53 mL/min — ABNORMAL LOW (ref >60)
Glucose, Bld: 90 mg/dL (ref 70–99)
Potassium: 5.3 mmol/L — ABNORMAL HIGH (ref 3.5–5.1)
Sodium: 130 mmol/L — ABNORMAL LOW (ref 135–145)
Total Bilirubin: 2.8 mg/dL — ABNORMAL HIGH (ref 0.3–1.2)
Total Protein: 8 g/dL (ref 6.5–8.1)

## 2018-11-16 MED ORDER — ATROPINE SULFATE 1 MG/ML IJ SOLN
0.5000 mg | Freq: Once | INTRAMUSCULAR | Status: AC | PRN
  Administered 2018-11-16: 0.5 mg via INTRAVENOUS

## 2018-11-16 MED ORDER — SODIUM CHLORIDE 0.9 % IV SOLN
Freq: Once | INTRAVENOUS | Status: AC
  Administered 2018-11-16: 12:00:00 via INTRAVENOUS
  Filled 2018-11-16: qty 250

## 2018-11-16 MED ORDER — DEXAMETHASONE SODIUM PHOSPHATE 10 MG/ML IJ SOLN
INTRAMUSCULAR | Status: AC
  Filled 2018-11-16: qty 1

## 2018-11-16 MED ORDER — PALONOSETRON HCL INJECTION 0.25 MG/5ML
INTRAVENOUS | Status: AC
  Filled 2018-11-16: qty 5

## 2018-11-16 MED ORDER — OXYCODONE-ACETAMINOPHEN 5-325 MG PO TABS
2.0000 | ORAL_TABLET | Freq: Once | ORAL | Status: AC
  Administered 2018-11-16: 2 via ORAL

## 2018-11-16 MED ORDER — IRINOTECAN HCL CHEMO INJECTION 100 MG/5ML
125.0000 mg/m2 | Freq: Once | INTRAVENOUS | Status: AC
  Administered 2018-11-16: 240 mg via INTRAVENOUS
  Filled 2018-11-16: qty 12

## 2018-11-16 MED ORDER — OXYCODONE-ACETAMINOPHEN 5-325 MG PO TABS
ORAL_TABLET | ORAL | Status: AC
  Filled 2018-11-16: qty 2

## 2018-11-16 MED ORDER — SODIUM CHLORIDE 0.9 % IV SOLN
5.3000 mg/kg | Freq: Once | INTRAVENOUS | Status: AC
  Administered 2018-11-16: 400 mg via INTRAVENOUS
  Filled 2018-11-16: qty 16

## 2018-11-16 MED ORDER — PALONOSETRON HCL INJECTION 0.25 MG/5ML
0.2500 mg | Freq: Once | INTRAVENOUS | Status: AC
  Administered 2018-11-16: 0.25 mg via INTRAVENOUS

## 2018-11-16 MED ORDER — LEUCOVORIN CALCIUM INJECTION 350 MG
200.0000 mg/m2 | Freq: Once | INTRAVENOUS | Status: AC
  Administered 2018-11-16: 14:00:00 384 mg via INTRAVENOUS
  Filled 2018-11-16: qty 19.2

## 2018-11-16 MED ORDER — SODIUM CHLORIDE 0.9% FLUSH
10.0000 mL | Freq: Once | INTRAVENOUS | Status: AC
  Administered 2018-11-16: 10 mL
  Filled 2018-11-16: qty 10

## 2018-11-16 MED ORDER — SODIUM CHLORIDE 0.9 % IV SOLN
1800.0000 mg/m2 | INTRAVENOUS | Status: DC
  Administered 2018-11-16: 3450 mg via INTRAVENOUS
  Filled 2018-11-16: qty 69

## 2018-11-16 MED ORDER — ATROPINE SULFATE 1 MG/ML IJ SOLN
INTRAMUSCULAR | Status: AC
  Filled 2018-11-16: qty 1

## 2018-11-16 MED ORDER — DEXAMETHASONE SODIUM PHOSPHATE 10 MG/ML IJ SOLN
10.0000 mg | Freq: Once | INTRAMUSCULAR | Status: AC
  Administered 2018-11-16: 10 mg via INTRAVENOUS

## 2018-11-16 NOTE — Progress Notes (Addendum)
[facility] OFFICE PROGRESS NOTE   Diagnosis: Colon cancer  INTERVAL HISTORY:   Mr. Klingerman returns as scheduled.  He notes increased leg edema and abdominal fullness.  No diarrhea.  No nausea or vomiting.  Abdominal pain is unchanged.  He notes he is urinating with less frequency.  No dysuria.  He feels fluid intake is adequate.  He fell at home 2 days ago.  He attributes the fall to new "slippers".  Objective:  Vital signs in last 24 hours:  Blood pressure 119/67, pulse (!) 103, temperature 98.2 F (36.8 C), temperature source Temporal, resp. rate 17, height '5\' 11"'$  (1.803 m), weight 166 lb 4.8 oz (75.4 kg), SpO2 98 %.    HEENT: Mouth appears dry.  No thrush or ulcers. GI: Abdomen is distended, liver palpable throughout the upper abdomen. Vascular: Pitting edema at the lower legs bilaterally. Neuro: Alert and oriented. Port-A-Cath without erythema.  Lab Results:  Lab Results  Component Value Date   WBC 11.9 (H) 11/15/2018   HGB 11.5 (L) 11/15/2018   HCT 34.4 (L) 11/15/2018   MCV 104.9 (H) 11/15/2018   PLT 176 11/15/2018   NEUTROABS 9.3 (H) 11/15/2018    Imaging:  Ct Chest W Contrast  Result Date: 11/15/2018 CLINICAL DATA:  Metastatic colon cancer, chemotherapy completed. 10 pounds weight loss since June 2020. Lower extremity swelling for 1.5 weeks. Lower abdominal pain and nausea. EXAM: CT CHEST, ABDOMEN, AND PELVIS WITH CONTRAST TECHNIQUE: Multidetector CT imaging of the chest, abdomen and pelvis was performed following the standard protocol during bolus administration of intravenous contrast. CONTRAST:  1107m OMNIPAQUE IOHEXOL 300 MG/ML  SOLN COMPARISON:  Multiple exams, including 09/09/2018 and PET-CT from 05/10/2018 FINDINGS: CT CHEST FINDINGS Cardiovascular: Right Port-A-Cath tip: Right atrium. Mild atherosclerotic calcification of the aortic arch. Mediastinum/Nodes: Small mediastinal lymph nodes are not pathologically enlarged. Contrast medium in the  distal esophagus, potentially from reflux or dysmotility. Lungs/Pleura: New and enlarging bilateral pulmonary nodules are present compatible with metastatic lesions. An index pleural based lesion in the right lower lobe on image 104/6 measures 1.4 by 1.1 cm. A second index lesion anteriorly in the left upper lobe measures 1.5 by 0.9 cm on image 79/6. There is a small amount of mucus tracking in the trachea. Atelectasis noted along the hemidiaphragms, right greater than left. Musculoskeletal: Unremarkable CT ABDOMEN PELVIS FINDINGS Hepatobiliary: Increase in diffuse tumor burden throughout the liver resulting in significantly expanded volume of the liver compared to the prior exam. An index lesion in the upper portion of the caudate lobe measures 3.5 by 3.2 cm on image 49/2, formerly 1.5 by 1.1 cm. The previously used index lesions are obscured by surrounding tumor and difficult to measure, but the overall tumor burden in the liver is considerably worsened. Gallbladder unremarkable although surrounded by the perihepatic ascites. There is prominent effacement of the intrahepatic portion of the IVC by tumor causing extrinsic mass effect. Pancreas: Unremarkable Spleen: Unremarkable Adrenals/Urinary Tract: Stable mild fullness of the left adrenal gland without discrete mass. Urinary bladder unremarkable. Stomach/Bowel: Possible wall thickening along the greater curvature of the stomach for example on image 76/2, versus adjacent fluid along the gastric margin. Sigmoid colon diverticulosis. Vascular/Lymphatic: Aortoiliac atherosclerotic vascular disease. Reproductive: Unremarkable Other: Extensive ascites and fluid infiltration of the mesentery and omentum. Along the right paracolic gutter, a soft tissue density which could represent peritoneal tumor or a thickened appendix abuts the cecum and adjacent terminal ileum, and measures 2.8 by 2.0 cm on image 92/2, formerly measuring  1.8 by 1.4 cm on 09/09/2018. Along the pelvic  ascites for example on images 112-114 of series 2, there is dependent density which could possibly from the from tumor. Musculoskeletal: Degenerative disc disease at L5-S1 with loss of disc height. Foraminal impingement observed at L3-4, L4-5, and on the left at L5-S1. IMPRESSION: 1. Unfortunately there has been considerable progression of disease with new scattered bilateral pulmonary metastatic nodules, and considerable increase in diffuse tumor burden throughout the liver. 2. Increased ascites, with suspected tumor nodularity along the right paracolic gutter and likely along the pelvic ascites. 3. Extrinsic mass effect from tumor effaces the intrahepatic portion of the IVC, causing it to have a slit like morphology. 4. Cannot exclude wall thickening in the stomach. 5. Other imaging findings of potential clinical significance: Aortic Atherosclerosis (ICD10-I70.0). Esophageal reflux versus dysmotility. Sigmoid colon diverticulosis. Lower lumbar foraminal impingement due to spondylosis and degenerative disc disease. Electronically Signed   By: Van Clines M.D.   On: 11/15/2018 16:06   Ct Abdomen Pelvis W Contrast  Result Date: 11/15/2018 CLINICAL DATA:  Metastatic colon cancer, chemotherapy completed. 10 pounds weight loss since June 2020. Lower extremity swelling for 1.5 weeks. Lower abdominal pain and nausea. EXAM: CT CHEST, ABDOMEN, AND PELVIS WITH CONTRAST TECHNIQUE: Multidetector CT imaging of the chest, abdomen and pelvis was performed following the standard protocol during bolus administration of intravenous contrast. CONTRAST:  151m OMNIPAQUE IOHEXOL 300 MG/ML  SOLN COMPARISON:  Multiple exams, including 09/09/2018 and PET-CT from 05/10/2018 FINDINGS: CT CHEST FINDINGS Cardiovascular: Right Port-A-Cath tip: Right atrium. Mild atherosclerotic calcification of the aortic arch. Mediastinum/Nodes: Small mediastinal lymph nodes are not pathologically enlarged. Contrast medium in the distal esophagus,  potentially from reflux or dysmotility. Lungs/Pleura: New and enlarging bilateral pulmonary nodules are present compatible with metastatic lesions. An index pleural based lesion in the right lower lobe on image 104/6 measures 1.4 by 1.1 cm. A second index lesion anteriorly in the left upper lobe measures 1.5 by 0.9 cm on image 79/6. There is a small amount of mucus tracking in the trachea. Atelectasis noted along the hemidiaphragms, right greater than left. Musculoskeletal: Unremarkable CT ABDOMEN PELVIS FINDINGS Hepatobiliary: Increase in diffuse tumor burden throughout the liver resulting in significantly expanded volume of the liver compared to the prior exam. An index lesion in the upper portion of the caudate lobe measures 3.5 by 3.2 cm on image 49/2, formerly 1.5 by 1.1 cm. The previously used index lesions are obscured by surrounding tumor and difficult to measure, but the overall tumor burden in the liver is considerably worsened. Gallbladder unremarkable although surrounded by the perihepatic ascites. There is prominent effacement of the intrahepatic portion of the IVC by tumor causing extrinsic mass effect. Pancreas: Unremarkable Spleen: Unremarkable Adrenals/Urinary Tract: Stable mild fullness of the left adrenal gland without discrete mass. Urinary bladder unremarkable. Stomach/Bowel: Possible wall thickening along the greater curvature of the stomach for example on image 76/2, versus adjacent fluid along the gastric margin. Sigmoid colon diverticulosis. Vascular/Lymphatic: Aortoiliac atherosclerotic vascular disease. Reproductive: Unremarkable Other: Extensive ascites and fluid infiltration of the mesentery and omentum. Along the right paracolic gutter, a soft tissue density which could represent peritoneal tumor or a thickened appendix abuts the cecum and adjacent terminal ileum, and measures 2.8 by 2.0 cm on image 92/2, formerly measuring 1.8 by 1.4 cm on 09/09/2018. Along the pelvic ascites for  example on images 112-114 of series 2, there is dependent density which could possibly from the from tumor. Musculoskeletal: Degenerative disc disease at  L5-S1 with loss of disc height. Foraminal impingement observed at L3-4, L4-5, and on the left at L5-S1. IMPRESSION: 1. Unfortunately there has been considerable progression of disease with new scattered bilateral pulmonary metastatic nodules, and considerable increase in diffuse tumor burden throughout the liver. 2. Increased ascites, with suspected tumor nodularity along the right paracolic gutter and likely along the pelvic ascites. 3. Extrinsic mass effect from tumor effaces the intrahepatic portion of the IVC, causing it to have a slit like morphology. 4. Cannot exclude wall thickening in the stomach. 5. Other imaging findings of potential clinical significance: Aortic Atherosclerosis (ICD10-I70.0). Esophageal reflux versus dysmotility. Sigmoid colon diverticulosis. Lower lumbar foraminal impingement due to spondylosis and degenerative disc disease. Electronically Signed   By: Van Clines M.D.   On: 11/15/2018 16:06    Medications: I have reviewed the patient's current medications.  Assessment/Plan: 1.Right lower quadrant massextensive liver metastases  CTs 04/22/2018-right lower quadrant mass associated with the ileum and cecum, hepatomegaly with extensive liver metastases, multiple lung nodules consistent with metastases  Colonoscopy 04/24/2018-multiple benign-appearing polyps removed, no colon mass  04/24/2018 chromogranin A 350  04/24/2018 CEA 632  Biopsy liver lesion 04/25/2018-adenocarcinoma with extensive necrosis;microsatellite stable; tumor mutation burden 6; KRAS A59E;PIK3CA; PTEN  Cycle 1 FOLFOX 04/28/2018  PET scan 05/10/2018-widespread bilateral hepatic metastasis. Hypermetabolic right lower quadrant mass. Hypermetabolic ileocolic mesenteric nodal metastasis.  Cycle 2 FOLFOX 05/12/2018 (5-FU dose reduced due to mucositis  following cycle 1)  Cycle 3 FOLFOX 05/26/2018 (oxaliplatin dose reduced secondary to thrombocytopenia)  Cycle 4 FOLFOX 06/09/2018  Cycle 5 FOLFOX 06/23/2018  CT 07/01/2018-decreased tumor burden in the liver, decreased right abdomen mesenteric/omental implants  Cycle 6 FOLFOX 07/07/2018  Cycle 7 FOLFOX 07/21/2018  Cycle 8 FOLFOX 08/04/2018  Cycle 9 FOLFOX 08/18/2018  Cycle 10 FOLFOX 09/01/2018  CT 09/09/2018-reduction in size of pericecal implant and ileocecal lymphadenopathy. Mild improvement in widespread hepatic metastasis. Small subpleural right middle lobe pulmonary nodule not identified on prior. Mild acute diverticulitis of the sigmoid colon.  Maintenance Xeloda 09/20/2018, discontinued 10/11/2018 secondary to failure to thrive and early satiety  Xeloda resumed at a reduced dose 10/17/2018  Xeloda discontinued 11/09/2018 secondary to clinical evidence of disease progression  CTs 11/15/2018- new scattered bilateral pulmonary nodules, considerable increase in diffuse tumor burden throughout the liver; increased ascites with suspected tumor nodularity along the right paracolic gutter and likely lung the pelvic ascites.  Extrinsic mass-effect from tumor effaces the intrahepatic portion of the IVC.  Cycle 1 FOLFIRI/Avastin 11/16/2018  2.Lower extremity edema secondary to massive hepatomegaly and hypoalbuminemia-resolved  3.Chronic back pain  4.Proximal leg weakness-deconditioning?, Spinal stenosis?-Improved  5.Mucositis, candidiasis 05/06/2018. Resolved following a course of Diflucan.Repeat course of Diflucan 07/07/2018; repeat course of Diflucan 09/01/2018 and 10/11/2018    Disposition: Mr. Medford appears stable.  The restaging CTs from yesterday confirm disease progression.  The recommendation is to proceed with FOLFIRI/Avastin today as scheduled (pending results of repeat chemistry panel).  We again reviewed potential toxicities.  He agrees to proceed.  We reviewed  the labs from yesterday.  Counts are adequate for treatment.  Potassium was mildly elevated.  Bilirubin with stable elevation as compared to 1 week ago.  We are obtaining a stat repeat chemistry panel today prior to treatment.  He will return for lab, follow-up, cycle 2 FOLFIRI/Avastin in 2 weeks.  He will contact the office in the interim with any problems.  Patient seen with Dr. Benay Spice.  25 minutes were spent face-to-face at today's visit with the majority of that  time involved in counseling/coordination of care.  Addendum-repeat chemistry panel with potassium 5.3, creatinine 1.4, bilirubin 2.8.  Irinotecan dose reduced by 25% due to bilirubin.  Okay to proceed with treatment.  [name] ANP/GNP-BC   11/16/2018  11:16 AM  This was a shared visit with [name].  The restaging CTs from disease progression.  We discussed the CT findings with Dr. Veva Holes.  I reviewed the CT images.  Proceed with FOLFIRI/Avastin.  Irinotecan will be dose reduced secondary to hyperbilirubinemia.    Julieanne Manson, MD

## 2018-11-16 NOTE — Progress Notes (Signed)
Per [name]: Does not need urine protein for 1st treatment. Hold tx today until repeat chemistry results are reviewed. Per [name], NP: CMP results reviewed. No action required for K+ elevation.

## 2018-11-16 NOTE — Patient Instructions (Addendum)
[facility] Discharge Instructions for Patients Receiving Chemotherapy  Today you received the following chemotherapy agents: MVASI, Irinotecan, Leucovorin, Fluorouracil  To help prevent nausea and vomiting after your treatment, we encourage you to take your nausea medication as prescribed.   If you develop nausea and vomiting that is not controlled by your nausea medication, call the clinic.   BELOW ARE SYMPTOMS THAT SHOULD BE REPORTED IMMEDIATELY:  *FEVER GREATER THAN 100.5 F  *CHILLS WITH OR WITHOUT FEVER  NAUSEA AND VOMITING THAT IS NOT CONTROLLED WITH YOUR NAUSEA MEDICATION  *UNUSUAL SHORTNESS OF BREATH  *UNUSUAL BRUISING OR BLEEDING  TENDERNESS IN MOUTH AND THROAT WITH OR WITHOUT PRESENCE OF ULCERS  *URINARY PROBLEMS  *BOWEL PROBLEMS  UNUSUAL RASH Items with * indicate a potential emergency and should be followed up as soon as possible.  Feel free to call the clinic should you have any questions or concerns. The clinic phone number is [phone].  Please show the [facility] at check-in to the Emergency Department and triage nurse.  Irinotecan injection What is this medicine? IRINOTECAN (ir in oh TEE kan ) is a chemotherapy drug. It is used to treat colon and rectal cancer. This medicine may be used for other purposes; ask your health care provider or pharmacist if you have questions. COMMON BRAND NAME(S): Camptosar What should I tell my health care provider before I take this medicine? They need to know if you have any of these conditions:  dehydration  diarrhea  infection (especially a virus infection such as chickenpox, cold sores, or herpes)  liver disease  low blood counts, like low white cell, platelet, or red cell counts  low levels of calcium, magnesium, or potassium in the blood  recent or ongoing radiation therapy  an unusual or allergic reaction to irinotecan, other medicines, foods, dyes, or preservatives  pregnant or  trying to get pregnant  breast-feeding How should I use this medicine? This drug is given as an infusion into a vein. It is administered in a hospital or clinic by a specially trained health care professional. Talk to your pediatrician regarding the use of this medicine in children. Special care may be needed. Overdosage: If you think you have taken too much of this medicine contact a poison control center or emergency room at once. NOTE: This medicine is only for you. Do not share this medicine with others. What if I miss a dose? It is important not to miss your dose. Call your doctor or health care professional if you are unable to keep an appointment. What may interact with this medicine? This medicine may interact with the following medications:  antiviral medicines for HIV or AIDS  certain antibiotics like rifampin or rifabutin  certain medicines for fungal infections like itraconazole, ketoconazole, posaconazole, and voriconazole  certain medicines for seizures like carbamazepine, phenobarbital, phenotoin  clarithromycin  gemfibrozil  nefazodone  St. John's Wort This list may not describe all possible interactions. Give your health care provider a list of all the medicines, herbs, non-prescription drugs, or dietary supplements you use. Also tell them if you smoke, drink alcohol, or use illegal drugs. Some items may interact with your medicine. What should I watch for while using this medicine? Your condition will be monitored carefully while you are receiving this medicine. You will need important blood work done while you are taking this medicine. This drug may make you feel generally unwell. This is not uncommon, as chemotherapy can affect healthy cells as well as  cancer cells. Report any side effects. Continue your course of treatment even though you feel ill unless your doctor tells you to stop. In some cases, you may be given additional medicines to help with side effects.  Follow all directions for their use. You may get drowsy or dizzy. Do not drive, use machinery, or do anything that needs mental alertness until you know how this medicine affects you. Do not stand or sit up quickly, especially if you are an older patient. This reduces the risk of dizzy or fainting spells. Call your health care professional for advice if you get a fever, chills, or sore throat, or other symptoms of a cold or flu. Do not treat yourself. This medicine decreases your body's ability to fight infections. Try to avoid being around people who are sick. Avoid taking products that contain aspirin, acetaminophen, ibuprofen, naproxen, or ketoprofen unless instructed by your doctor. These medicines may hide a fever. This medicine may increase your risk to bruise or bleed. Call your doctor or health care professional if you notice any unusual bleeding. Be careful brushing and flossing your teeth or using a toothpick because you may get an infection or bleed more easily. If you have any dental work done, tell your dentist you are receiving this medicine. Do not become pregnant while taking this medicine or for 6 months after stopping it. Women should inform their health care professional if they wish to become pregnant or think they might be pregnant. Men should not father a child while taking this medicine and for 3 months after stopping it. There is potential for serious side effects to an unborn child. Talk to your health care professional for more information. Do not breast-feed an infant while taking this medicine or for 7 days after stopping it. This medicine has caused ovarian failure in some women. This medicine may make it more difficult to get pregnant. Talk to your health care professional if you are concerned about your fertility. This medicine has caused decreased sperm counts in some men. This may make it more difficult to father a child. Talk to your health care professional if you are  concerned about your fertility. What side effects may I notice from receiving this medicine? Side effects that you should report to your doctor or health care professional as soon as possible:  allergic reactions like skin rash, itching or hives, swelling of the face, lips, or tongue  chest pain  diarrhea  flushing, runny nose, sweating during infusion  low blood counts - this medicine may decrease the number of white blood cells, red blood cells and platelets. You may be at increased risk for infections and bleeding.  nausea, vomiting  pain, swelling, warmth in the leg  signs of decreased platelets or bleeding - bruising, pinpoint red spots on the skin, black, tarry stools, blood in the urine  signs of infection - fever or chills, cough, sore throat, pain or difficulty passing urine  signs of decreased red blood cells - unusually weak or tired, fainting spells, lightheadedness Side effects that usually do not require medical attention (report to your doctor or health care professional if they continue or are bothersome):  constipation  hair loss  headache  loss of appetite  mouth sores  stomach pain This list may not describe all possible side effects. Call your doctor for medical advice about side effects. You may report side effects to FDA at 1-800-FDA-1088. Where should I keep my medicine? This drug is given in a   hospital or clinic and will not be stored at home. NOTE: This sheet is a summary. It may not cover all possible information. If you have questions about this medicine, talk to your doctor, pharmacist, or health care provider.  2020 Elsevier/Gold Standard (2018-04-29 10:09:17)   Bevacizumab injection What is this medicine? BEVACIZUMAB (be va SIZ yoo mab) is a monoclonal antibody. It is used to treat many types of cancer. This medicine may be used for other purposes; ask your health care provider or pharmacist if you have questions. COMMON BRAND NAME(S):  Avastin, MVASI, Zirabev What should I tell my health care provider before I take this medicine? They need to know if you have any of these conditions:  diabetes  heart disease  high blood pressure  history of coughing up blood  prior anthracycline chemotherapy (e.g., doxorubicin, daunorubicin, epirubicin)  recent or ongoing radiation therapy  recent or planning to have surgery  stroke  an unusual or allergic reaction to bevacizumab, hamster proteins, mouse proteins, other medicines, foods, dyes, or preservatives  pregnant or trying to get pregnant  breast-feeding How should I use this medicine? This medicine is for infusion into a vein. It is given by a health care professional in a hospital or clinic setting. Talk to your pediatrician regarding the use of this medicine in children. Special care may be needed. Overdosage: If you think you have taken too much of this medicine contact a poison control center or emergency room at once. NOTE: This medicine is only for you. Do not share this medicine with others. What if I miss a dose? It is important not to miss your dose. Call your doctor or health care professional if you are unable to keep an appointment. What may interact with this medicine? Interactions are not expected. This list may not describe all possible interactions. Give your health care provider a list of all the medicines, herbs, non-prescription drugs, or dietary supplements you use. Also tell them if you smoke, drink alcohol, or use illegal drugs. Some items may interact with your medicine. What should I watch for while using this medicine? Your condition will be monitored carefully while you are receiving this medicine. You will need important blood work and urine testing done while you are taking this medicine. This medicine may increase your risk to bruise or bleed. Call your doctor or health care professional if you notice any unusual bleeding. This medicine  should be started at least 28 days following major surgery and the site of the surgery should be totally healed. Check with your doctor before scheduling dental work or surgery while you are receiving this treatment. Talk to your doctor if you have recently had surgery or if you have a wound that has not healed. Do not become pregnant while taking this medicine or for 6 months after stopping it. Women should inform their doctor if they wish to become pregnant or think they might be pregnant. There is a potential for serious side effects to an unborn child. Talk to your health care professional or pharmacist for more information. Do not breast-feed an infant while taking this medicine and for 6 months after the last dose. This medicine has caused ovarian failure in some women. This medicine may interfere with the ability to have a child. You should talk to your doctor or health care professional if you are concerned about your fertility. What side effects may I notice from receiving this medicine? Side effects that you should report to your doctor  or health care professional as soon as possible:  allergic reactions like skin rash, itching or hives, swelling of the face, lips, or tongue  chest pain or chest tightness  chills  coughing up blood  high fever  seizures  severe constipation  signs and symptoms of bleeding such as bloody or black, tarry stools; red or dark-brown urine; spitting up blood or brown material that looks like coffee grounds; red spots on the skin; unusual bruising or bleeding from the eye, gums, or nose  signs and symptoms of a blood clot such as breathing problems; chest pain; severe, sudden headache; pain, swelling, warmth in the leg  signs and symptoms of a stroke like changes in vision; confusion; trouble speaking or understanding; severe headaches; sudden numbness or weakness of the face, arm or leg; trouble walking; dizziness; loss of balance or  coordination  stomach pain  sweating  swelling of legs or ankles  vomiting  weight gain Side effects that usually do not require medical attention (report to your doctor or health care professional if they continue or are bothersome):  back pain  changes in taste  decreased appetite  dry skin  nausea  tiredness This list may not describe all possible side effects. Call your doctor for medical advice about side effects. You may report side effects to FDA at 1-800-FDA-1088. Where should I keep my medicine? This drug is given in a hospital or clinic and will not be stored at home. NOTE: This sheet is a summary. It may not cover all possible information. If you have questions about this medicine, talk to your doctor, pharmacist, or health care provider.  2020 Elsevier/Gold Standard (2016-03-06 14:33:29)

## 2018-11-17 ENCOUNTER — Encounter: Payer: Self-pay | Admitting: *Deleted

## 2018-11-17 ENCOUNTER — Telehealth: Payer: Self-pay | Admitting: Nurse Practitioner

## 2018-11-17 NOTE — Progress Notes (Signed)
Wife has requested that only her contact phone # be listed for appointments and results since patient is too ill to manage his care currently. [phone]

## 2018-11-17 NOTE — Telephone Encounter (Signed)
Called and spoke with patient. Confirmed appt. Advised infusion will be added

## 2018-11-18 ENCOUNTER — Other Ambulatory Visit: Payer: Self-pay

## 2018-11-18 ENCOUNTER — Other Ambulatory Visit: Payer: Self-pay | Admitting: Nurse Practitioner

## 2018-11-18 ENCOUNTER — Inpatient Hospital Stay: Payer: 59

## 2018-11-18 VITALS — BP 126/58 | HR 111 | Temp 98.0°F | Resp 16

## 2018-11-18 DIAGNOSIS — C189 Malignant neoplasm of colon, unspecified: Secondary | ICD-10-CM

## 2018-11-18 DIAGNOSIS — C180 Malignant neoplasm of cecum: Secondary | ICD-10-CM | POA: Diagnosis not present

## 2018-11-18 DIAGNOSIS — C787 Secondary malignant neoplasm of liver and intrahepatic bile duct: Secondary | ICD-10-CM

## 2018-11-18 MED ORDER — OXYCODONE-ACETAMINOPHEN 5-325 MG PO TABS: 1.0000 | ORAL_TABLET | ORAL | 0 refills | Status: DC | PRN

## 2018-11-18 MED ORDER — HEPARIN SOD (PORK) LOCK FLUSH 100 UNIT/ML IV SOLN
500.0000 [IU] | Freq: Once | INTRAVENOUS | Status: AC | PRN
  Administered 2018-11-18: 500 [IU]
  Filled 2018-11-18: qty 5

## 2018-11-18 MED ORDER — SODIUM CHLORIDE 0.9% FLUSH
10.0000 mL | INTRAVENOUS | Status: DC | PRN
  Administered 2018-11-18: 14:00:00 10 mL
  Filled 2018-11-18: qty 10

## 2018-11-19 ENCOUNTER — Other Ambulatory Visit: Payer: Self-pay | Admitting: Oncology

## 2018-11-19 DIAGNOSIS — C189 Malignant neoplasm of colon, unspecified: Secondary | ICD-10-CM

## 2018-11-19 DIAGNOSIS — C787 Secondary malignant neoplasm of liver and intrahepatic bile duct: Secondary | ICD-10-CM

## 2018-11-21 ENCOUNTER — Other Ambulatory Visit: Payer: Self-pay | Admitting: *Deleted

## 2018-11-21 ENCOUNTER — Telehealth: Payer: Self-pay | Admitting: *Deleted

## 2018-11-21 ENCOUNTER — Telehealth: Payer: Self-pay | Admitting: Oncology

## 2018-11-21 MED ORDER — MAGIC MOUTHWASH: 5.0000 mL | Freq: Four times a day (QID) | ORAL | 0 refills | Status: AC | PRN

## 2018-11-21 NOTE — Telephone Encounter (Signed)
No availability to move appointments from 9/9 to 9/10. Confirmed with wife.

## 2018-11-21 NOTE — Telephone Encounter (Signed)
Received voice message from pt wife stating pt had mouth sores. Per Dr. Benay Spice magic mouthwash called in to pt requested pharmacy.

## 2018-11-25 ENCOUNTER — Telehealth: Payer: Self-pay | Admitting: *Deleted

## 2018-11-25 NOTE — Telephone Encounter (Signed)
Wife called to report he is having some blood in saliva when he spits due to mouth sores. She does not see any active bleeding. He just started the MMW yesterday. Instructed her to have him continue this and drink cool, non-carbonated beverages. May use baking soda and water rinses in between the MMW doses as well.  She is asking to move his treatments back to Thursdays and wants MD to schedule the next two treatments as well. Patient is taking #2 Percocet every 4 hours and asking if he can take something in between the doses?

## 2018-11-28 ENCOUNTER — Other Ambulatory Visit: Payer: Self-pay | Admitting: Internal Medicine

## 2018-11-28 DIAGNOSIS — C787 Secondary malignant neoplasm of liver and intrahepatic bile duct: Secondary | ICD-10-CM

## 2018-11-28 MED ORDER — OXYCODONE-ACETAMINOPHEN 5-325 MG PO TABS: 1.0000 | ORAL_TABLET | ORAL | 0 refills | Status: DC | PRN

## 2018-11-29 ENCOUNTER — Telehealth: Payer: Self-pay | Admitting: Medical Oncology

## 2018-11-29 ENCOUNTER — Other Ambulatory Visit: Payer: Self-pay | Admitting: Oncology

## 2018-11-29 DIAGNOSIS — C787 Secondary malignant neoplasm of liver and intrahepatic bile duct: Secondary | ICD-10-CM

## 2018-11-29 MED ORDER — OXYCODONE-ACETAMINOPHEN 5-325 MG PO TABS: 1.0000 | ORAL_TABLET | ORAL | 0 refills | Status: AC | PRN

## 2018-11-29 NOTE — Telephone Encounter (Signed)
Oxycodone refill requested . Dr Julien Nordmann gave him 27 yesterday . She said he will need more to get him through tonight -he has appt tomorrow 1130 am . [facility].

## 2018-11-30 ENCOUNTER — Telehealth: Payer: Self-pay | Admitting: Oncology

## 2018-11-30 ENCOUNTER — Inpatient Hospital Stay: Payer: 59

## 2018-11-30 ENCOUNTER — Inpatient Hospital Stay: Payer: 59 | Attending: Nurse Practitioner | Admitting: Oncology

## 2018-11-30 ENCOUNTER — Other Ambulatory Visit: Payer: Self-pay

## 2018-11-30 VITALS — BP 118/75 | HR 105 | Temp 98.2°F | Resp 18 | Ht 71.0 in | Wt 164.5 lb

## 2018-11-30 DIAGNOSIS — M549 Dorsalgia, unspecified: Secondary | ICD-10-CM | POA: Diagnosis not present

## 2018-11-30 DIAGNOSIS — R630 Anorexia: Secondary | ICD-10-CM | POA: Diagnosis not present

## 2018-11-30 DIAGNOSIS — Z1159 Encounter for screening for other viral diseases: Secondary | ICD-10-CM | POA: Diagnosis not present

## 2018-11-30 DIAGNOSIS — G8929 Other chronic pain: Secondary | ICD-10-CM | POA: Diagnosis not present

## 2018-11-30 DIAGNOSIS — Z79899 Other long term (current) drug therapy: Secondary | ICD-10-CM | POA: Insufficient documentation

## 2018-11-30 DIAGNOSIS — C189 Malignant neoplasm of colon, unspecified: Secondary | ICD-10-CM

## 2018-11-30 DIAGNOSIS — R531 Weakness: Secondary | ICD-10-CM | POA: Diagnosis not present

## 2018-11-30 DIAGNOSIS — R4182 Altered mental status, unspecified: Secondary | ICD-10-CM | POA: Diagnosis not present

## 2018-11-30 DIAGNOSIS — R0981 Nasal congestion: Secondary | ICD-10-CM | POA: Diagnosis not present

## 2018-11-30 DIAGNOSIS — C787 Secondary malignant neoplasm of liver and intrahepatic bile duct: Secondary | ICD-10-CM

## 2018-11-30 DIAGNOSIS — Z9221 Personal history of antineoplastic chemotherapy: Secondary | ICD-10-CM | POA: Insufficient documentation

## 2018-11-30 DIAGNOSIS — R600 Localized edema: Secondary | ICD-10-CM | POA: Diagnosis not present

## 2018-11-30 DIAGNOSIS — C180 Malignant neoplasm of cecum: Secondary | ICD-10-CM | POA: Diagnosis present

## 2018-11-30 DIAGNOSIS — Z95828 Presence of other vascular implants and grafts: Secondary | ICD-10-CM

## 2018-11-30 DIAGNOSIS — R197 Diarrhea, unspecified: Secondary | ICD-10-CM | POA: Insufficient documentation

## 2018-11-30 LAB — CEA (IN HOUSE-CHCC): CEA (CHCC-In House): 1799.82 ng/mL — ABNORMAL HIGH (ref 0.00–5.00)

## 2018-11-30 LAB — CBC WITH DIFFERENTIAL (CANCER CENTER ONLY)
Abs Immature Granulocytes: 0.06 10*3/uL (ref 0.00–0.07)
Basophils Absolute: 0 10*3/uL (ref 0.0–0.1)
Basophils Relative: 0 %
Eosinophils Absolute: 0 10*3/uL (ref 0.0–0.5)
Eosinophils Relative: 1 %
HCT: 31.4 % — ABNORMAL LOW (ref 39.0–52.0)
Hemoglobin: 10.3 g/dL — ABNORMAL LOW (ref 13.0–17.0)
Immature Granulocytes: 3 %
Lymphocytes Relative: 30 %
Lymphs Abs: 0.6 10*3/uL — ABNORMAL LOW (ref 0.7–4.0)
MCH: 35 pg — ABNORMAL HIGH (ref 26.0–34.0)
MCHC: 32.8 g/dL (ref 30.0–36.0)
MCV: 106.8 fL — ABNORMAL HIGH (ref 80.0–100.0)
Monocytes Absolute: 0.9 10*3/uL (ref 0.1–1.0)
Monocytes Relative: 47 %
Neutro Abs: 0.4 10*3/uL — CL (ref 1.7–7.7)
Neutrophils Relative %: 19 %
Platelet Count: 167 10*3/uL (ref 150–400)
RBC: 2.94 MIL/uL — ABNORMAL LOW (ref 4.22–5.81)
RDW: 19.9 % — ABNORMAL HIGH (ref 11.5–15.5)
WBC Count: 1.9 10*3/uL — ABNORMAL LOW (ref 4.0–10.5)
nRBC: 0 % (ref 0.0–0.2)

## 2018-11-30 LAB — CMP (CANCER CENTER ONLY)
ALT: 12 U/L (ref 0–44)
AST: 31 U/L (ref 15–41)
Albumin: 2.2 g/dL — ABNORMAL LOW (ref 3.5–5.0)
Alkaline Phosphatase: 495 U/L — ABNORMAL HIGH (ref 38–126)
Anion gap: 14 (ref 5–15)
BUN: 16 mg/dL (ref 8–23)
CO2: 21 mmol/L — ABNORMAL LOW (ref 22–32)
Calcium: 8.4 mg/dL — ABNORMAL LOW (ref 8.9–10.3)
Chloride: 95 mmol/L — ABNORMAL LOW (ref 98–111)
Creatinine: 0.98 mg/dL (ref 0.61–1.24)
GFR, Est AFR Am: >60 mL/min (ref >60)
GFR, Estimated: >60 mL/min (ref >60)
Glucose, Bld: 130 mg/dL — ABNORMAL HIGH (ref 70–99)
Potassium: 3.9 mmol/L (ref 3.5–5.1)
Sodium: 130 mmol/L — ABNORMAL LOW (ref 135–145)
Total Bilirubin: 1.2 mg/dL (ref 0.3–1.2)
Total Protein: 7.2 g/dL (ref 6.5–8.1)

## 2018-11-30 MED ORDER — FLUCONAZOLE 100 MG PO TABS: 100.0000 mg | ORAL_TABLET | Freq: Every day | ORAL | 0 refills | Status: AC

## 2018-11-30 MED ORDER — SODIUM CHLORIDE 0.9% FLUSH
10.0000 mL | INTRAVENOUS | Status: DC | PRN
  Administered 2018-11-30: 13:00:00 10 mL via INTRAVENOUS
  Filled 2018-11-30: qty 10

## 2018-11-30 MED ORDER — SODIUM CHLORIDE 0.9% FLUSH
10.0000 mL | INTRAVENOUS | Status: DC | PRN
  Administered 2018-11-30: 10 mL
  Filled 2018-11-30: qty 10

## 2018-11-30 MED ORDER — HEPARIN SOD (PORK) LOCK FLUSH 100 UNIT/ML IV SOLN
500.0000 [IU] | Freq: Once | INTRAVENOUS | Status: AC
  Administered 2018-11-30: 500 [IU] via INTRAVENOUS
  Filled 2018-11-30: qty 5

## 2018-11-30 NOTE — Progress Notes (Signed)
[facility] OFFICE PROGRESS NOTE   Diagnosis: Metastatic carcinoma  INTERVAL HISTORY:   Thomas Riley completed a cycle of FOLFIRI/Avastin on 11/16/2018.  He reports mild intermittent diarrhea since completing chemotherapy.  He has nasal congestion and "popping "in the right ear.  He has mild soreness.  Thomas Riley is present by telephone for today's visit.  She reports he had an episode of rectal bleeding and bleeding from the mouth. He continues to have abdominal pain, relieved with oxycodone.  He has persistent leg edema.  Thomas Riley reports feeling weak.  Thomas appetite is poor. Objective:  Vital signs in last 24 hours:  Blood pressure 118/75, pulse (!) 105, temperature 98.2 F (36.8 C), temperature source Oral, resp. rate 18, height '5\' 11"'$  (1.803 m), weight 164 lb 8 oz (74.6 kg), SpO2 100 %.    HEENT: Ulcer at the right side of the tongue, thrush at the bilateral buccal mucosa and pharynx, small amount of cerumen in the right external canal, good light reflex, no effusion at the tympanic membrane Resp: End inspiratory rales at the posterior lung base bilaterally, no respiratory distress Cardio: Regular rate and rhythm GI: The liver is palpable in the upper abdomen Vascular: Pitting edema below the knee bilaterally    Portacath/PICC-without erythema  Lab Results:  Lab Results  Component Value Date   WBC 1.9 (L) 11/30/2018   HGB 10.3 (L) 11/30/2018   HCT 31.4 (L) 11/30/2018   MCV 106.8 (H) 11/30/2018   PLT 167 11/30/2018   NEUTROABS 0.4 (LL) 11/30/2018    CMP  Lab Results  Component Value Date   NA 130 (L) 11/30/2018   K 3.9 11/30/2018   CL 95 (L) 11/30/2018   CO2 21 (L) 11/30/2018   GLUCOSE 130 (H) 11/30/2018   BUN 16 11/30/2018   CREATININE 0.98 11/30/2018   CALCIUM 8.4 (L) 11/30/2018   PROT 7.2 11/30/2018   ALBUMIN 2.2 (L) 11/30/2018   AST 31 11/30/2018   ALT 12 11/30/2018   ALKPHOS 495 (H) 11/30/2018   BILITOT 1.2 11/30/2018   GFRNONAA >60  11/30/2018   GFRAA >60 11/30/2018    Lab Results  Component Value Date   CEA1 1,821.02 (H) 11/15/2018    Lab Results  Component Value Date   INR 1.07 05/10/2018    Imaging:  No results found.  Medications: I have reviewed the patient's current medications.   Assessment/Plan: 1. Right lower quadrant massextensive liver metastases  CTs 04/22/2018-right lower quadrant mass associated with the ileum and cecum, hepatomegaly with extensive liver metastases, multiple lung nodules consistent with metastases  Colonoscopy 04/24/2018-multiple benign-appearing polyps removed, no colon mass  04/24/2018 chromogranin A 350  04/24/2018 CEA 632  Biopsy liver lesion 04/25/2018-adenocarcinoma with extensive necrosis;microsatellite stable; tumor mutation burden 6; KRAS A59E;PIK3CA; PTEN  Cycle 1 FOLFOX 04/28/2018  PET scan 05/10/2018-widespread bilateral hepatic metastasis. Hypermetabolic right lower quadrant mass. Hypermetabolic ileocolic mesenteric nodal metastasis.  Cycle 2 FOLFOX 05/12/2018 (5-FU dose reduced due to mucositis following cycle 1)  Cycle 3 FOLFOX 05/26/2018 (oxaliplatin dose reduced secondary to thrombocytopenia)  Cycle 4 FOLFOX 06/09/2018  Cycle 5 FOLFOX 06/23/2018  CT 07/01/2018-decreased tumor burden in the liver, decreased right abdomen mesenteric/omental implants  Cycle 6 FOLFOX 07/07/2018  Cycle 7 FOLFOX 07/21/2018  Cycle 8 FOLFOX 08/04/2018  Cycle 9 FOLFOX 08/18/2018  Cycle 10 FOLFOX 09/01/2018  CT 09/09/2018-reduction in size of pericecal implant and ileocecal lymphadenopathy. Mild improvement in widespread hepatic metastasis. Small subpleural right middle lobe pulmonary nodule not identified on prior.  Mild acute diverticulitis of the sigmoid colon.  Maintenance Xeloda 09/20/2018, discontinued 10/11/2018 secondary to failure to thrive and early satiety  Xeloda resumed at a reduced dose 10/17/2018  Xeloda discontinued 11/09/2018 secondary to clinical evidence of  disease progression  CTs 11/15/2018- new scattered bilateral pulmonary nodules, considerable increase in diffuse tumor burden throughout the liver; increased ascites with suspected tumor nodularity along the right paracolic gutter and likely lung the pelvic ascites.  Extrinsic mass-effect from tumor effaces the intrahepatic portion of the IVC.  Cycle 1 FOLFIRI/Avastin 11/16/2018  2.Lower extremity edema secondary to massive hepatomegaly and hypoalbuminemia-resolved  3.Chronic back pain  4.Proximal leg weakness-deconditioning?, Spinal stenosis?-Improved  5.Mucositis, candidiasis 05/06/2018. Resolved following a course of Diflucan.Repeat course of Diflucan 07/07/2018; repeat course of Diflucan 09/01/2018 and 10/11/2018  6.  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  Thomas performance status has declined over the past several weeks.  He is symptomatic with abdominal pain and lower extremity edema. Leukemia and mucositis following cycle 1 FOLFIRI.  Cycle 2 will be held.  He will call for a fever.  We prescribed Diflucan for oral candidiasis.  Thomas Riley will be added with cycle 2.  I discussed treatment options with Thomas Riley and Thomas Riley.  They understand he may not be a candidate for further chemotherapy if Thomas performance status does not improve over the next few weeks. He will return for reassessment and cycle 2 FOLFIRI in 1 week.  25 minutes were spent with the patient today.  The majority of the time was used for counseling and coordination of care.  Thomas Coder, MD  11/30/2018  1:31 PM

## 2018-11-30 NOTE — Telephone Encounter (Signed)
Scheduled appt per 9/9 los - gave patient AVS and calender per los.   

## 2018-12-02 ENCOUNTER — Telehealth: Payer: Self-pay | Admitting: *Deleted

## 2018-12-02 NOTE — Telephone Encounter (Signed)
Wife left VM that Leng is very weak and she thinks she needs help in the home. Requested a return call. Called wife back and got VM. Left message that call was attempted.

## 2018-12-05 ENCOUNTER — Other Ambulatory Visit: Payer: Self-pay

## 2018-12-05 ENCOUNTER — Encounter: Payer: Self-pay | Admitting: Nurse Practitioner

## 2018-12-05 ENCOUNTER — Inpatient Hospital Stay (HOSPITAL_BASED_OUTPATIENT_CLINIC_OR_DEPARTMENT_OTHER): Payer: 59 | Admitting: Medical

## 2018-12-05 ENCOUNTER — Telehealth: Payer: Self-pay | Admitting: *Deleted

## 2018-12-05 ENCOUNTER — Inpatient Hospital Stay (HOSPITAL_BASED_OUTPATIENT_CLINIC_OR_DEPARTMENT_OTHER): Payer: 59 | Admitting: Nurse Practitioner

## 2018-12-05 ENCOUNTER — Inpatient Hospital Stay (HOSPITAL_COMMUNITY)
Admission: AD | Admit: 2018-12-05 | Discharge: 2018-12-22 | DRG: 951 | Disposition: E | Payer: 59 | Source: Ambulatory Visit | Attending: Internal Medicine | Admitting: Internal Medicine

## 2018-12-05 ENCOUNTER — Other Ambulatory Visit: Payer: Self-pay | Admitting: Medical

## 2018-12-05 VITALS — BP 96/60 | HR 132 | Temp 98.3°F | Resp 20

## 2018-12-05 DIAGNOSIS — R112 Nausea with vomiting, unspecified: Secondary | ICD-10-CM | POA: Diagnosis not present

## 2018-12-05 DIAGNOSIS — Z515 Encounter for palliative care: Principal | ICD-10-CM | POA: Diagnosis present

## 2018-12-05 DIAGNOSIS — Z66 Do not resuscitate: Secondary | ICD-10-CM | POA: Diagnosis present

## 2018-12-05 DIAGNOSIS — Z6822 Body mass index (BMI) 22.0-22.9, adult: Secondary | ICD-10-CM

## 2018-12-05 DIAGNOSIS — K56609 Unspecified intestinal obstruction, unspecified as to partial versus complete obstruction: Secondary | ICD-10-CM

## 2018-12-05 DIAGNOSIS — R160 Hepatomegaly, not elsewhere classified: Secondary | ICD-10-CM | POA: Diagnosis not present

## 2018-12-05 DIAGNOSIS — R600 Localized edema: Secondary | ICD-10-CM | POA: Diagnosis not present

## 2018-12-05 DIAGNOSIS — F17210 Nicotine dependence, cigarettes, uncomplicated: Secondary | ICD-10-CM | POA: Diagnosis present

## 2018-12-05 DIAGNOSIS — R296 Repeated falls: Secondary | ICD-10-CM | POA: Diagnosis present

## 2018-12-05 DIAGNOSIS — Z820 Family history of epilepsy and other diseases of the nervous system: Secondary | ICD-10-CM | POA: Diagnosis not present

## 2018-12-05 DIAGNOSIS — Z825 Family history of asthma and other chronic lower respiratory diseases: Secondary | ICD-10-CM | POA: Diagnosis not present

## 2018-12-05 DIAGNOSIS — R64 Cachexia: Secondary | ICD-10-CM | POA: Diagnosis present

## 2018-12-05 DIAGNOSIS — M549 Dorsalgia, unspecified: Secondary | ICD-10-CM | POA: Diagnosis present

## 2018-12-05 DIAGNOSIS — C801 Malignant (primary) neoplasm, unspecified: Secondary | ICD-10-CM | POA: Diagnosis present

## 2018-12-05 DIAGNOSIS — C787 Secondary malignant neoplasm of liver and intrahepatic bile duct: Secondary | ICD-10-CM | POA: Diagnosis present

## 2018-12-05 DIAGNOSIS — Z20828 Contact with and (suspected) exposure to other viral communicable diseases: Secondary | ICD-10-CM | POA: Diagnosis present

## 2018-12-05 DIAGNOSIS — C180 Malignant neoplasm of cecum: Secondary | ICD-10-CM | POA: Diagnosis not present

## 2018-12-05 DIAGNOSIS — Z9181 History of falling: Secondary | ICD-10-CM | POA: Diagnosis not present

## 2018-12-05 DIAGNOSIS — R410 Disorientation, unspecified: Secondary | ICD-10-CM | POA: Diagnosis not present

## 2018-12-05 DIAGNOSIS — G8929 Other chronic pain: Secondary | ICD-10-CM | POA: Diagnosis present

## 2018-12-05 DIAGNOSIS — R402423 Glasgow coma scale score 9-12, at hospital admission: Secondary | ICD-10-CM | POA: Diagnosis present

## 2018-12-05 DIAGNOSIS — C772 Secondary and unspecified malignant neoplasm of intra-abdominal lymph nodes: Secondary | ICD-10-CM | POA: Diagnosis present

## 2018-12-05 DIAGNOSIS — C7801 Secondary malignant neoplasm of right lung: Secondary | ICD-10-CM | POA: Diagnosis present

## 2018-12-05 DIAGNOSIS — C7802 Secondary malignant neoplasm of left lung: Secondary | ICD-10-CM | POA: Diagnosis present

## 2018-12-05 DIAGNOSIS — Z9221 Personal history of antineoplastic chemotherapy: Secondary | ICD-10-CM

## 2018-12-05 DIAGNOSIS — Z8249 Family history of ischemic heart disease and other diseases of the circulatory system: Secondary | ICD-10-CM | POA: Diagnosis not present

## 2018-12-05 DIAGNOSIS — R000 Tachycardia, unspecified: Secondary | ICD-10-CM | POA: Diagnosis not present

## 2018-12-05 DIAGNOSIS — E8809 Other disorders of plasma-protein metabolism, not elsewhere classified: Secondary | ICD-10-CM | POA: Diagnosis not present

## 2018-12-05 DIAGNOSIS — C189 Malignant neoplasm of colon, unspecified: Secondary | ICD-10-CM | POA: Diagnosis present

## 2018-12-05 DIAGNOSIS — R4182 Altered mental status, unspecified: Secondary | ICD-10-CM

## 2018-12-05 LAB — SARS CORONAVIRUS 2 BY RT PCR (HOSPITAL ORDER, PERFORMED IN ~~LOC~~ HOSPITAL LAB): SARS Coronavirus 2: NEGATIVE

## 2018-12-05 MED ORDER — BIOTENE DRY MOUTH MT LIQD: 15.0000 mL | OROMUCOSAL | Status: DC | PRN

## 2018-12-05 MED ORDER — GLYCOPYRROLATE 1 MG PO TABS: 1.0000 mg | ORAL_TABLET | ORAL | Status: DC | PRN

## 2018-12-05 MED ORDER — HALOPERIDOL LACTATE 5 MG/ML IJ SOLN
0.5000 mg | INTRAMUSCULAR | Status: DC | PRN
  Administered 2018-12-05: 0.5 mg via INTRAVENOUS
  Filled 2018-12-05: qty 1

## 2018-12-05 MED ORDER — HALOPERIDOL LACTATE 2 MG/ML PO CONC
0.5000 mg | ORAL | Status: DC | PRN
  Filled 2018-12-05: qty 0.3

## 2018-12-05 MED ORDER — SODIUM CHLORIDE 0.9 % IV SOLN: INTRAVENOUS | Status: DC

## 2018-12-05 MED ORDER — SODIUM CHLORIDE 0.9 % IV SOLN
INTRAVENOUS | Status: DC
  Administered 2018-12-05: 17:00:00 1000 mL via INTRAVENOUS

## 2018-12-05 MED ORDER — HALOPERIDOL 0.5 MG PO TABS: 0.5000 mg | ORAL_TABLET | ORAL | Status: DC | PRN

## 2018-12-05 MED ORDER — ACETAMINOPHEN 650 MG RE SUPP: 650.0000 mg | Freq: Four times a day (QID) | RECTAL | Status: DC | PRN

## 2018-12-05 MED ORDER — ONDANSETRON 4 MG PO TBDP: 4.0000 mg | ORAL_TABLET | Freq: Four times a day (QID) | ORAL | Status: DC | PRN

## 2018-12-05 MED ORDER — POLYVINYL ALCOHOL 1.4 % OP SOLN: 1.0000 [drp] | Freq: Four times a day (QID) | OPHTHALMIC | Status: DC | PRN

## 2018-12-05 MED ORDER — GLYCOPYRROLATE 0.2 MG/ML IJ SOLN: 0.2000 mg | INTRAMUSCULAR | Status: DC | PRN

## 2018-12-05 MED ORDER — ONDANSETRON HCL 4 MG/2ML IJ SOLN
4.0000 mg | Freq: Four times a day (QID) | INTRAMUSCULAR | Status: DC | PRN
  Administered 2018-12-06: 02:00:00 4 mg via INTRAVENOUS
  Filled 2018-12-05: qty 2

## 2018-12-05 MED ORDER — ACETAMINOPHEN 325 MG PO TABS: 650.0000 mg | ORAL_TABLET | Freq: Four times a day (QID) | ORAL | Status: DC | PRN

## 2018-12-05 NOTE — H&P (Signed)
HPI  Nakari Deleo [id number] DOB: [birth year] DOA: 12/19/2018  PCP: Vivi Barrack, MD   Chief Complaint: End-stage cancer  HPI:  63 year old white male undifferentiated metastatic cancer-status post multiple rounds of chemo FOLFIRI Avastin 11/16/2018-worsening ECOG status Patient is unable to give me the history and wife tells me that ever since last Thursday after he had a fall he is unable to do much and has been more listless She relates he was somewhat functional at last oncology office visit 9/9 but has declined precipitously since then  I spoke in detail with Dr. Benay Spice on the phone called for direct admission as it looks like patient is moribund appearing and does not seem like he has any records or any other curative options  Review of Systems:  Cannot obtain given level 5 caveat  Pertinent +'s: Pertinent -"s  Past Medical History:  Diagnosis Date  . colon ca with liver mets dx'd 04/22/18  . GERD (gastroesophageal reflux disease)    Past Surgical History:  Procedure Laterality Date  . COLONOSCOPY WITH PROPOFOL N/A 04/24/2018   Procedure: COLONOSCOPY WITH PROPOFOL;  Surgeon: Jerene Bears, MD;  Location: [facility];  Service: Gastroenterology;  Laterality: N/A;  . IR IMAGING GUIDED PORT INSERTION  05/10/2018  . POLYPECTOMY  04/24/2018   Procedure: POLYPECTOMY;  Surgeon: Jerene Bears, MD;  Location: [facility] ENDOSCOPY;  Service: Gastroenterology;;    reports that he has been smoking cigarettes. He has been smoking about 0.30 packs per day. He has never used smokeless tobacco. He reports current alcohol use of about 12.0 standard drinks of alcohol per week. He reports that he does not use drugs.  Mobility: No total care  No Known Allergies Family History  Problem Relation Age of Onset  . COPD Mother   . Heart disease Father   . COPD Brother   . Parkinson's disease Brother   . Cancer Neg Hx    Prior to Admission medications   Medication Sig Start Date End Date  Taking? Authorizing Provider  fluconazole (DIFLUCAN) 100 MG tablet Take 1 tablet (100 mg total) by mouth daily. 11/30/18   Ladell Pier, MD  Loperamide HCl (IMODIUM A-D PO) Take 2-4 mg by mouth as directed. Take according to camptosar instructions    Ladell Pier, MD  magic mouthwash SOLN Take 5 mLs by mouth 4 (four) times daily as needed for mouth pain (SWISH AND SPIT). 11/21/18   Ladell Pier, MD  omeprazole (PRILOSEC) 20 MG capsule TAKE 1 CAPSULE BY MOUTH EVERY DAY 10/03/18   Ladell Pier, MD  oxyCODONE-acetaminophen (PERCOCET/ROXICET) 5-325 MG tablet Take 1-2 tablets by mouth every 4 (four) hours as needed for severe pain. 11/29/18   Ladell Pier, MD  prochlorperazine (COMPAZINE) 10 MG tablet Take 1 tablet (10 mg total) by mouth every 6 (six) hours as needed for nausea or vomiting. 04/27/18   Owens Shark, NP    Physical Exam:  Vitals:   12/08/2018 1604  BP: 99/64  Pulse: (!) 102  Resp: 18  Temp: 97.8 F (36.6 C)  SpO2: 96%     Weak cachectic bitemporal wasting dry mucosa S1-S2 no murmur rub or gallop  Chest clinically clear with no added sound no rales no rhonchi  Abdomen is soft without rebound or guarding there is grossly distended with a smiling umbilicus  He has anasarca  He has not passed urine in 2 days  Neurologically he is able to move his limbs I would estimate his  GCS is may be 11 or 12  He reacts to menace  I have personally reviewed following labs and imaging studies  Labs:       Imaging studies:      Medical tests:   EKG independently reviewed:     Test discussed with performing physician:     Decision to obtain old records:      Review and summation of old records:      Active Problems:   Cancer [facility])   Assessment/Plan End-stage cancer actively dying I have admitted him under the hospice order set with all comfort care medications and offered the wife as much recourse and conciliation as I can I do think it would be  beneficial to have the chaplain come and see her and help her and her family transition Mr. Gabriel Rainwater We had a brief chat about his life and I answered honestly to her that I do not think he will make it through the night so she should get her children together and we will allow multiple visitors to come and be with Mr. Veva Holes in his final hours   Severity of Illness: The appropriate patient status for this patient is OBSERVATION. Observation status is judged to be reasonable and necessary in order to provide the required intensity of service to ensure the patient's safety. The patient's presenting symptoms, physical exam findings, and initial radiographic and laboratory data in the context of their medical condition is felt to place them at decreased risk for further clinical deterioration. Furthermore, it is anticipated that the patient will be medically stable for discharge from the hospital within 2 midnights of admission. The following factors support the patient status of observation.   " The patient's presenting symptoms include cancer. " The physical exam findings include unresponsiveness. " The initial radiographic and laboratory data are no.    DVT prophylaxis: No DVT prophylaxis Code Status: DNR confirmed Family Communication: Discussed with wife Consults called: None  Time spent: 45 minutes  Verlon Au, MD Jerl Mina my NP partners at night for Care related issues] [facility] --[facility] [employer] [facility] , [email or web address]; password [facility]  12/02/2018, 5:19 PM

## 2018-12-05 NOTE — Telephone Encounter (Signed)
Wife called tearful to report he has had several falls over the weekend and had to get EMS to help get him up once. He is very weak and can't safely walk alone. He is confused at times and will often just stare in space when she is talking to him and won't respond. He has not had BM since 9/11 and abdomen is very swollen. Vomited yesterday and it smelled like stool. Feels helpless and afraid. Per Dr. Benay Spice: Come in today at 11:15 to see [name], NP> Wife will have neighbor help her get him in the car. Per Dr. Julien Nordmann: OK for wife to come in for visit today.

## 2018-12-05 NOTE — Progress Notes (Signed)
The patient is a 63 year old male who is managed by Dr. Dominica Severin B. Sherrill and is pending an admission today for mental status changes.  He has a history of a metastatic colon cancer.  He was seen by this provider today who collected a rapid COVID-19 test.  Results are pending.  Sandi Mealy, MHS, PA-C Physician Assistant

## 2018-12-05 NOTE — Progress Notes (Signed)
   12/04/2018 1604  MEWS Score  Resp 18  Pulse Rate (!) 102  BP 99/64  Temp 97.8 F (36.6 C)  SpO2 96 %  O2 Device Room Air  MEWS Score  MEWS RR 0  MEWS Pulse 1  MEWS Systolic 1  MEWS LOC 0  MEWS Temp 0  MEWS Score 2  MEWS Score Color Yellow  MD at bedside, Identified pt as actively dying. Will notify MD to obtain comfort care order

## 2018-12-05 NOTE — Progress Notes (Signed)
After patient received some IV fluids he become alert and was talking to his visitors. Isolation removed patient negative for covid-19.

## 2018-12-05 NOTE — Progress Notes (Addendum)
[facility] [facility] OFFICE PROGRESS NOTE   Diagnosis: Metastatic carcinoma  INTERVAL HISTORY:   Thomas Riley returns prior to scheduled follow-up.  He completed cycle 1 FOLFIRI/Avastin 11/16/2018.  Cycle 2 was held on 11/30/2018 due to neutropenia, mucositis, poor performance status.  He is accompanied by his wife to today's visit.  She reports he is not eating or drinking and he has not urinated for more than 48 hours.  He has been falling.  He was confused this morning and is not able to follow instructions.  No fever.  He vomits intermittently.  She notes that the vomit smells like "stool".  She estimates last bowel movement was 4 days ago.  Objective:  Vital signs in last 24 hours:  Blood pressure 96/60, pulse (!) 132, temperature 98.3 F (36.8 C), temperature source Temporal, resp. rate 20, SpO2 97 %.    HEENT: Mouth is dry appearing. Resp: Diffuse inspiratory rales.  No respiratory distress. Cardio: Regular, tachycardic. GI: Abdomen is distended, exam consistent with ascites. Vascular: Pitting edema throughout both legs. Neuro: He is awake, somewhat lethargic.  Does not respond to questions.  Does not follow commands. Skin: Warm and dry. Port-A-Cath without erythema.  Lab Results:  Lab Results  Component Value Date   WBC 1.9 (L) 11/30/2018   HGB 10.3 (L) 11/30/2018   HCT 31.4 (L) 11/30/2018   MCV 106.8 (H) 11/30/2018   PLT 167 11/30/2018   NEUTROABS 0.4 (LL) 11/30/2018    Imaging:  No results found.  Medications: I have reviewed the patient's current medications.  Assessment/Plan: 1. Right lower quadrant massextensive liver metastases  CTs 04/22/2018-right lower quadrant mass associated with the ileum and cecum, hepatomegaly with extensive liver metastases, multiple lung nodules consistent with metastases  Colonoscopy 04/24/2018-multiple benign-appearing polyps removed, no colon mass  04/24/2018 chromogranin A 350  04/24/2018 CEA 632  Biopsy liver lesion  04/25/2018-adenocarcinoma with extensive necrosis;microsatellite stable; tumor mutation burden 6; KRAS A59E;PIK3CA; PTEN  Cycle 1 FOLFOX 04/28/2018  PET scan 05/10/2018-widespread bilateral hepatic metastasis. Hypermetabolic right lower quadrant mass. Hypermetabolic ileocolic mesenteric nodal metastasis.  Cycle 2 FOLFOX 05/12/2018 (5-FU dose reduced due to mucositis following cycle 1)  Cycle 3 FOLFOX 05/26/2018 (oxaliplatin dose reduced secondary to thrombocytopenia)  Cycle 4 FOLFOX 06/09/2018  Cycle 5 FOLFOX 06/23/2018  CT 07/01/2018-decreased tumor burden in the liver, decreased right abdomen mesenteric/omental implants  Cycle 6 FOLFOX 07/07/2018  Cycle 7 FOLFOX 07/21/2018  Cycle 8 FOLFOX 08/04/2018  Cycle 9 FOLFOX 08/18/2018  Cycle 10 FOLFOX 09/01/2018  CT 09/09/2018-reduction in size of pericecal implant and ileocecal lymphadenopathy. Mild improvement in widespread hepatic metastasis. Small subpleural right middle lobe pulmonary nodule not identified on prior. Mild acute diverticulitis of the sigmoid colon.  Maintenance Xeloda 09/20/2018, discontinued 10/11/2018 secondary to failure to thrive and early satiety  Xeloda resumed at a reduced dose 10/17/2018  Xeloda discontinued 11/09/2018 secondary to clinical evidence of disease progression  CTs 11/15/2018- new scattered bilateral pulmonary nodules, considerable increase in diffuse tumor burden throughout the liver; increased ascites with suspected tumor nodularity along the right paracolic gutter and likely lung the pelvic ascites.  Extrinsic mass-effect from tumor effaces the intrahepatic portion of the IVC.  Cycle 1 FOLFIRI/Avastin 11/16/2018  2.Lower extremity edema secondary to massive hepatomegaly and hypoalbuminemia-resolved; recurrent  3.Chronic back pain  4.Proximal leg weakness-deconditioning?, Spinal stenosis?-Improved  5.Mucositis, candidiasis 05/06/2018. Resolved following a course of Diflucan.Repeat  course of Diflucan 07/07/2018; repeat course of Diflucan 09/01/2018 and 10/11/2018  6.  Neutropenia following cycle 1 FOLFIRI, Udenyca  added with cycle 2   Disposition: Mr. Maye completed 1 cycle of FOLFIRI/Avastin on 11/16/2018.  Cycle 2 held last week due to neutropenia, mucositis, poor performance status.  His performance status continues to decline.  He appears dehydrated.  He is having nausea and is constipated.  He has had several falls.  He and his wife understand he is not a candidate for further chemotherapy in his current condition.  We discussed hospitalization for further evaluation.  They are in agreement.  We reviewed CODE STATUS/end-of-life issues.  He will be placed on NO CODE BLUE status.  He will have COVID testing while in the office with the plan for admission to the hospitalist service once that result is available.  Patient seen with Dr. Benay Spice.  25 minutes were spent face-to-face at today's visit with the majority of that time involved in counseling/coordination of care.    [name] ANP/GNP-BC   12/04/2018  11:44 AM This was a shared visit with [name].  Mr. Kempker was interviewed and examined.  His performance status has declined compared to when I saw him last week.  I suspect his symptoms are related to disease progression and dehydration.  He may have a bowel obstruction.  We discussed the poor prognosis with his wife.  Mr. Odonell is confused and was unable to participate in our discussion today.  We discussed CPR and ACLS issues.  He will be placed on a no CODE BLUE status.  His wife agrees to a Hospice referral.  The plan is to arrange for hospital admission to see if there is a reversible cause for his subacute symptoms such as dehydration, electrolyte abnormalities, or a bowel obstruction.  We will recommend home hospice care versus residential hospice depending on his clinical status over the next 1-2 days.   Julieanne Manson, MD

## 2018-12-06 DIAGNOSIS — R410 Disorientation, unspecified: Secondary | ICD-10-CM

## 2018-12-06 DIAGNOSIS — R160 Hepatomegaly, not elsewhere classified: Secondary | ICD-10-CM

## 2018-12-06 DIAGNOSIS — C189 Malignant neoplasm of colon, unspecified: Secondary | ICD-10-CM

## 2018-12-06 DIAGNOSIS — E8809 Other disorders of plasma-protein metabolism, not elsewhere classified: Secondary | ICD-10-CM

## 2018-12-06 DIAGNOSIS — C787 Secondary malignant neoplasm of liver and intrahepatic bile duct: Secondary | ICD-10-CM

## 2018-12-06 DIAGNOSIS — R000 Tachycardia, unspecified: Secondary | ICD-10-CM

## 2018-12-06 DIAGNOSIS — R112 Nausea with vomiting, unspecified: Secondary | ICD-10-CM

## 2018-12-06 DIAGNOSIS — R600 Localized edema: Secondary | ICD-10-CM

## 2018-12-06 DIAGNOSIS — Z66 Do not resuscitate: Secondary | ICD-10-CM

## 2018-12-06 MED ORDER — MORPHINE BOLUS VIA INFUSION
2.0000 mg | INTRAVENOUS | Status: DC | PRN
  Administered 2018-12-06: 11:00:00 2 mg via INTRAVENOUS
  Filled 2018-12-06: qty 4

## 2018-12-06 MED ORDER — MORPHINE 100MG IN NS 100ML (1MG/ML) PREMIX INFUSION
2.0000 mg/h | INTRAVENOUS | Status: DC
  Administered 2018-12-06: 10:00:00 2 mg/h via INTRAVENOUS
  Filled 2018-12-06: qty 100

## 2018-12-06 MED ORDER — LORAZEPAM 2 MG/ML IJ SOLN: 0.5000 mg | INTRAMUSCULAR | Status: DC | PRN

## 2018-12-06 MED ORDER — SCOPOLAMINE 1 MG/3DAYS TD PT72
1.0000 | MEDICATED_PATCH | TRANSDERMAL | Status: DC
  Administered 2018-12-06: 10:00:00 1.5 mg via TRANSDERMAL
  Filled 2018-12-06: qty 1

## 2018-12-06 MED ORDER — HEPARIN SOD (PORK) LOCK FLUSH 100 UNIT/ML IV SOLN: 500.0000 [IU] | Freq: Once | INTRAVENOUS | Status: DC

## 2018-12-06 MED ORDER — MORPHINE SULFATE (PF) 2 MG/ML IV SOLN: 2.0000 mg | INTRAVENOUS | Status: DC | PRN

## 2018-12-06 MED ORDER — MORPHINE SULFATE (PF) 2 MG/ML IV SOLN
2.0000 mg | INTRAVENOUS | Status: DC | PRN
  Administered 2018-12-06: 2 mg via INTRAVENOUS
  Filled 2018-12-06: qty 1

## 2018-12-06 MED ORDER — MORPHINE SULFATE (PF) 2 MG/ML IV SOLN
2.0000 mg | Freq: Once | INTRAVENOUS | Status: AC
  Administered 2018-12-06: 2 mg via INTRAVENOUS
  Filled 2018-12-06: qty 1

## 2018-12-07 ENCOUNTER — Other Ambulatory Visit: Payer: 59

## 2018-12-07 ENCOUNTER — Ambulatory Visit: Payer: 59 | Admitting: Nurse Practitioner

## 2018-12-07 ENCOUNTER — Ambulatory Visit: Payer: 59

## 2018-12-22 NOTE — Progress Notes (Addendum)
Pt expired 2020, family bedside, 2 RN pronounced. Paged Dr & floor coverage NP & notified AC. Wasted 39ml of morphine from IVPB with Aloha Gell, RN.

## 2018-12-22 NOTE — Progress Notes (Addendum)
HEMATOLOGY-ONCOLOGY PROGRESS NOTE  SUBJECTIVE: Admitted from the cancer center on 12/11/2018 secondary to continued decline in his performance status, falls, confusion, and no oral intake.  Has been vomiting at home intermittently and wife stated his vomit smelled like stool.  Patient is now actively dying.  Opens eyes.  I was able to get him to say hello to me, but he was unable to answer questions.  Wife is at the bedside.  Oncology History  Colon cancer metastasized to liver [facility])  04/23/2018 Initial Diagnosis   Colon cancer metastasized to liver ([facility])   04/28/2018 - 09/14/2018 Chemotherapy   The patient had palonosetron (ALOXI) injection 0.25 mg, 0.25 mg, Intravenous,  Once, 10 of 13 cycles Administration: 0.25 mg (04/28/2018), 0.25 mg (05/12/2018), 0.25 mg (05/26/2018), 0.25 mg (06/09/2018), 0.25 mg (06/23/2018), 0.25 mg (07/07/2018), 0.25 mg (07/21/2018), 0.25 mg (08/04/2018), 0.25 mg (08/18/2018), 0.25 mg (09/01/2018) leucovorin 820 mg in dextrose 5 % 250 mL infusion, 400 mg/m2 = 820 mg, Intravenous,  Once, 10 of 13 cycles Dose modification: 300 mg/m2 (original dose 400 mg/m2, Cycle 2, Reason: Provider Judgment) Administration: 820 mg (04/28/2018), 616 mg (05/12/2018), 616 mg (05/26/2018), 616 mg (06/09/2018), 616 mg (06/23/2018), 588 mg (07/07/2018), 588 mg (07/21/2018), 588 mg (08/04/2018), 588 mg (08/18/2018), 588 mg (09/01/2018) oxaliplatin (ELOXATIN) 175 mg in dextrose 5 % 500 mL chemo infusion, 85 mg/m2 = 175 mg, Intravenous,  Once, 10 of 13 cycles Dose modification: 65 mg/m2 (original dose 85 mg/m2, Cycle 3, Reason: Provider Judgment) Administration: 175 mg (04/28/2018), 175 mg (05/12/2018), 135 mg (05/26/2018), 135 mg (06/09/2018), 135 mg (06/23/2018), 125 mg (07/07/2018), 125 mg (07/21/2018), 125 mg (08/04/2018), 125 mg (08/18/2018), 125 mg (09/01/2018) fluorouracil (ADRUCIL) 5,000 mg in sodium chloride 0.9 % 150 mL chemo infusion, 4,900 mg, Intravenous, 1 Day/Dose, 10 of 13 cycles Dose modification: 1,800 mg/m2 (original  dose 2,400 mg/m2, Cycle 2, Reason: Provider Judgment) Administration: 5,000 mg (04/28/2018), 3,700 mg (05/12/2018), 3,700 mg (05/26/2018), 3,700 mg (06/09/2018), 3,700 mg (06/23/2018), 3,550 mg (07/07/2018), 3,550 mg (07/21/2018), 3,550 mg (08/04/2018), 3,550 mg (08/18/2018), 3,550 mg (09/01/2018)  for chemotherapy treatment.    11/16/2018 -  Chemotherapy   The patient had palonosetron (ALOXI) injection 0.25 mg, 0.25 mg, Intravenous,  Once, 1 of 4 cycles Administration: 0.25 mg (11/16/2018) pegfilgrastim-cbqv (UDENYCA) injection 6 mg, 6 mg, Subcutaneous, Once, 0 of 3 cycles irinotecan (CAMPTOSAR) 240 mg in dextrose 5 % 500 mL chemo infusion, 125 mg/m2 = 240 mg (100 % of original dose 125 mg/m2), Intravenous,  Once, 1 of 4 cycles Dose modification: 125 mg/m2 (original dose 125 mg/m2, Cycle 1, Reason: Provider Judgment) Administration: 240 mg (11/16/2018) leucovorin 384 mg in dextrose 5 % 250 mL infusion, 200 mg/m2 = 384 mg (100 % of original dose 200 mg/m2), Intravenous,  Once, 1 of 4 cycles Dose modification: 200 mg/m2 (original dose 200 mg/m2, Cycle 1, Reason: Provider Judgment) Administration: 384 mg (11/16/2018) fluorouracil (ADRUCIL) 3,450 mg in sodium chloride 0.9 % 81 mL chemo infusion, 1,800 mg/m2 = 3,450 mg (100 % of original dose 1,800 mg/m2), Intravenous, 1 Day/Dose, 1 of 4 cycles Dose modification: 1,800 mg/m2 (original dose 1,800 mg/m2, Cycle 1, Reason: Provider Judgment) Administration: 3,450 mg (11/16/2018) bevacizumab-awwb (MVASI) 400 mg in sodium chloride 0.9 % 100 mL chemo infusion, 5.3 mg/kg = 375 mg (100 % of original dose 5 mg/kg), Intravenous,  Once, 1 of 4 cycles Dose modification: 5 mg/kg (original dose 5 mg/kg, Cycle 1, Reason: Other (see comments), Comment: Inj Bevacizumab-awwb [employer] Approved) Administration: 400 mg (11/16/2018)  for chemotherapy treatment.       REVIEW OF SYSTEMS:   Unable to obtain review of systems secondary to patient condition.  I have reviewed the past  medical history, past surgical history, social history and family history with the patient and they are unchanged from previous note.   PHYSICAL EXAMINATION:  Vitals:   12/15/2018 1604 2018/12/10 0441  BP: 99/64 133/65  Pulse: (!) 102 (!) 124  Resp: 18 (!) 35  Temp: 97.8 F (36.6 C)   SpO2: 96% (!) 75%   Filed Weights   11/28/2018 2059  Weight: 159 lb 13.3 oz (72.5 kg)    Intake/Output from previous day: 09/14 0701 - 09/15 0700 In: 1260.1 [I.V.:1260.1] Out: 300 [Urine:300]  GENERAL: Opens eyes.  Unable to really interact with me.  Agonal breathing. LUNGS: Increased oral secretions, some scattered rhonchi. HEART: Tachycardic, anasarca noted in his extremities. ABDOMEN: Distended, does not appear to be in pain with palpation. Musculoskeletal:no cyanosis of digits and no clubbing  NEURO: Opens eyes, unable to assess mentation.  LABORATORY DATA:  I have reviewed the data as listed CMP Latest Ref Rng & Units 11/30/2018 11/16/2018 11/15/2018  Glucose 70 - 99 mg/dL 130(H) 90 91  BUN 8 - 23 mg/dL 16 30(H) 25(H)  Creatinine 0.61 - 1.24 mg/dL 0.98 1.40(H) 1.22  Sodium 135 - 145 mmol/L 130(L) 130(L) 130(L)  Potassium 3.5 - 5.1 mmol/L 3.9 5.3(H) 5.3(H)  Chloride 98 - 111 mmol/L 95(L) 95(L) 96(L)  CO2 22 - 32 mmol/L 21(L) 20(L) 21(L)  Calcium 8.9 - 10.3 mg/dL 8.4(L) 9.8 10.2  Total Protein 6.5 - 8.1 g/dL 7.2 8.0 8.0  Total Bilirubin 0.3 - 1.2 mg/dL 1.2 2.8(H) 2.3(H)  Alkaline Phos 38 - 126 U/L 495(H) 660(H) 702(H)  AST 15 - 41 U/L 31 169(H) 160(H)  ALT 0 - 44 U/L _0 Lab Results  Component Value Date   WBC 1.9 (L) 11/30/2018   HGB 10.3 (L) 11/30/2018   HCT 31.4 (L) 11/30/2018   MCV 106.8 (H) 11/30/2018   PLT 167 11/30/2018   NEUTROABS 0.4 (LL) 11/30/2018    Ct Chest W Contrast  Result Date: 11/15/2018 CLINICAL DATA:  Metastatic colon cancer, chemotherapy completed. 10 pounds weight loss since June 2020. Lower extremity swelling for 1.5 weeks. Lower abdominal pain and  nausea. EXAM: CT CHEST, ABDOMEN, AND PELVIS WITH CONTRAST TECHNIQUE: Multidetector CT imaging of the chest, abdomen and pelvis was performed following the standard protocol during bolus administration of intravenous contrast. CONTRAST:  119m OMNIPAQUE IOHEXOL 300 MG/ML  SOLN COMPARISON:  Multiple exams, including 09/09/2018 and PET-CT from 05/10/2018 FINDINGS: CT CHEST FINDINGS Cardiovascular: Right Port-A-Cath tip: Right atrium. Mild atherosclerotic calcification of the aortic arch. Mediastinum/Nodes: Small mediastinal lymph nodes are not pathologically enlarged. Contrast medium in the distal esophagus, potentially from reflux or dysmotility. Lungs/Pleura: New and enlarging bilateral pulmonary nodules are present compatible with metastatic lesions. An index pleural based lesion in the right lower lobe on image 104/6 measures 1.4 by 1.1 cm. A second index lesion anteriorly in the left upper lobe measures 1.5 by 0.9 cm on image 79/6. There is a small amount of mucus tracking in the trachea. Atelectasis noted along the hemidiaphragms, right greater than left. Musculoskeletal: Unremarkable CT ABDOMEN PELVIS FINDINGS Hepatobiliary: Increase in diffuse tumor burden throughout the liver resulting in significantly expanded volume of the liver compared to the prior exam. An index lesion in the upper portion of the caudate lobe measures 3.5 by 3.2 cm on  image 49/2, formerly 1.5 by 1.1 cm. The previously used index lesions are obscured by surrounding tumor and difficult to measure, but the overall tumor burden in the liver is considerably worsened. Gallbladder unremarkable although surrounded by the perihepatic ascites. There is prominent effacement of the intrahepatic portion of the IVC by tumor causing extrinsic mass effect. Pancreas: Unremarkable Spleen: Unremarkable Adrenals/Urinary Tract: Stable mild fullness of the left adrenal gland without discrete mass. Urinary bladder unremarkable. Stomach/Bowel: Possible wall  thickening along the greater curvature of the stomach for example on image 76/2, versus adjacent fluid along the gastric margin. Sigmoid colon diverticulosis. Vascular/Lymphatic: Aortoiliac atherosclerotic vascular disease. Reproductive: Unremarkable Other: Extensive ascites and fluid infiltration of the mesentery and omentum. Along the right paracolic gutter, a soft tissue density which could represent peritoneal tumor or a thickened appendix abuts the cecum and adjacent terminal ileum, and measures 2.8 by 2.0 cm on image 92/2, formerly measuring 1.8 by 1.4 cm on 09/09/2018. Along the pelvic ascites for example on images 112-114 of series 2, there is dependent density which could possibly from the from tumor. Musculoskeletal: Degenerative disc disease at L5-S1 with loss of disc height. Foraminal impingement observed at L3-4, L4-5, and on the left at L5-S1. IMPRESSION: 1. Unfortunately there has been considerable progression of disease with new scattered bilateral pulmonary metastatic nodules, and considerable increase in diffuse tumor burden throughout the liver. 2. Increased ascites, with suspected tumor nodularity along the right paracolic gutter and likely along the pelvic ascites. 3. Extrinsic mass effect from tumor effaces the intrahepatic portion of the IVC, causing it to have a slit like morphology. 4. Cannot exclude wall thickening in the stomach. 5. Other imaging findings of potential clinical significance: Aortic Atherosclerosis (ICD10-I70.0). Esophageal reflux versus dysmotility. Sigmoid colon diverticulosis. Lower lumbar foraminal impingement due to spondylosis and degenerative disc disease. Electronically Signed   By: Van Clines M.D.   On: 11/15/2018 16:06   Ct Abdomen Pelvis W Contrast  Result Date: 11/15/2018 CLINICAL DATA:  Metastatic colon cancer, chemotherapy completed. 10 pounds weight loss since June 2020. Lower extremity swelling for 1.5 weeks. Lower abdominal pain and nausea. EXAM:  CT CHEST, ABDOMEN, AND PELVIS WITH CONTRAST TECHNIQUE: Multidetector CT imaging of the chest, abdomen and pelvis was performed following the standard protocol during bolus administration of intravenous contrast. CONTRAST:  16m OMNIPAQUE IOHEXOL 300 MG/ML  SOLN COMPARISON:  Multiple exams, including 09/09/2018 and PET-CT from 05/10/2018 FINDINGS: CT CHEST FINDINGS Cardiovascular: Right Port-A-Cath tip: Right atrium. Mild atherosclerotic calcification of the aortic arch. Mediastinum/Nodes: Small mediastinal lymph nodes are not pathologically enlarged. Contrast medium in the distal esophagus, potentially from reflux or dysmotility. Lungs/Pleura: New and enlarging bilateral pulmonary nodules are present compatible with metastatic lesions. An index pleural based lesion in the right lower lobe on image 104/6 measures 1.4 by 1.1 cm. A second index lesion anteriorly in the left upper lobe measures 1.5 by 0.9 cm on image 79/6. There is a small amount of mucus tracking in the trachea. Atelectasis noted along the hemidiaphragms, right greater than left. Musculoskeletal: Unremarkable CT ABDOMEN PELVIS FINDINGS Hepatobiliary: Increase in diffuse tumor burden throughout the liver resulting in significantly expanded volume of the liver compared to the prior exam. An index lesion in the upper portion of the caudate lobe measures 3.5 by 3.2 cm on image 49/2, formerly 1.5 by 1.1 cm. The previously used index lesions are obscured by surrounding tumor and difficult to measure, but the overall tumor burden in the liver is considerably worsened. Gallbladder unremarkable although  surrounded by the perihepatic ascites. There is prominent effacement of the intrahepatic portion of the IVC by tumor causing extrinsic mass effect. Pancreas: Unremarkable Spleen: Unremarkable Adrenals/Urinary Tract: Stable mild fullness of the left adrenal gland without discrete mass. Urinary bladder unremarkable. Stomach/Bowel: Possible wall thickening along  the greater curvature of the stomach for example on image 76/2, versus adjacent fluid along the gastric margin. Sigmoid colon diverticulosis. Vascular/Lymphatic: Aortoiliac atherosclerotic vascular disease. Reproductive: Unremarkable Other: Extensive ascites and fluid infiltration of the mesentery and omentum. Along the right paracolic gutter, a soft tissue density which could represent peritoneal tumor or a thickened appendix abuts the cecum and adjacent terminal ileum, and measures 2.8 by 2.0 cm on image 92/2, formerly measuring 1.8 by 1.4 cm on 09/09/2018. Along the pelvic ascites for example on images 112-114 of series 2, there is dependent density which could possibly from the from tumor. Musculoskeletal: Degenerative disc disease at L5-S1 with loss of disc height. Foraminal impingement observed at L3-4, L4-5, and on the left at L5-S1. IMPRESSION: 1. Unfortunately there has been considerable progression of disease with new scattered bilateral pulmonary metastatic nodules, and considerable increase in diffuse tumor burden throughout the liver. 2. Increased ascites, with suspected tumor nodularity along the right paracolic gutter and likely along the pelvic ascites. 3. Extrinsic mass effect from tumor effaces the intrahepatic portion of the IVC, causing it to have a slit like morphology. 4. Cannot exclude wall thickening in the stomach. 5. Other imaging findings of potential clinical significance: Aortic Atherosclerosis (ICD10-I70.0). Esophageal reflux versus dysmotility. Sigmoid colon diverticulosis. Lower lumbar foraminal impingement due to spondylosis and degenerative disc disease. Electronically Signed   By: Van Clines M.D.   On: 11/15/2018 16:06    ASSESSMENT AND PLAN: Right lower quadrant massextensive liver metastases  CTs 04/22/2018-right lower quadrant mass associated with the ileum and cecum, hepatomegaly with extensive liver metastases, multiple lung nodules consistent with  metastases  Colonoscopy 04/24/2018-multiple benign-appearing polyps removed, no colon mass  04/24/2018 chromogranin A 350  04/24/2018 CEA 632  Biopsy liver lesion 04/25/2018-adenocarcinoma with extensive necrosis;microsatellite stable; tumor mutation burden 6; KRAS A59E;PIK3CA; PTEN  Cycle 1 FOLFOX 04/28/2018  PET scan 05/10/2018-widespread bilateral hepatic metastasis. Hypermetabolic right lower quadrant mass. Hypermetabolic ileocolic mesenteric nodal metastasis.  Cycle 2 FOLFOX 05/12/2018 (5-FU dose reduced due to mucositis following cycle 1)  Cycle 3 FOLFOX 05/26/2018 (oxaliplatin dose reduced secondary to thrombocytopenia)  Cycle 4 FOLFOX 06/09/2018  Cycle 5 FOLFOX 06/23/2018  CT 07/01/2018-decreased tumor burden in the liver, decreased right abdomen mesenteric/omental implants  Cycle 6 FOLFOX 07/07/2018  Cycle 7 FOLFOX 07/21/2018  Cycle 8 FOLFOX 08/04/2018  Cycle 9 FOLFOX 08/18/2018  Cycle 10 FOLFOX 09/01/2018  CT 09/09/2018-reduction in size of pericecal implant and ileocecal lymphadenopathy. Mild improvement in widespread hepatic metastasis. Small subpleural right middle lobe pulmonary nodule not identified on prior. Mild acute diverticulitis of the sigmoid colon.  Maintenance Xeloda 09/20/2018, discontinued 10/11/2018 secondary to failure to thrive and early satiety  Xeloda resumed at a reduced dose 10/17/2018  Xeloda discontinued 11/09/2018 secondary to clinical evidence of disease progression  CTs 11/15/2018-new scattered bilateral pulmonary nodules, considerable increase in diffuse tumor burden throughout the liver; increased ascites with suspected tumor nodularity along the right paracolic gutter and likely lung the pelvic ascites. Extrinsic mass-effect from tumor effaces the intrahepatic portion of the IVC.  Cycle 1 FOLFIRI/Avastin 11/16/2018  2.Lower extremity edema secondary to massive hepatomegaly and hypoalbuminemia-resolved; recurrent  3.Chronic back  pain  4.Proximal leg weakness-deconditioning?, Spinal stenosis?-Improved  5.Mucositis, candidiasis 05/06/2018.  Resolved following a course of Diflucan.Repeat course of Diflucan 07/07/2018; repeat course of Diflucan 09/01/2018 and 10/11/2018  6.Neutropenia following cycle 1 FOLFIRI, Udenyca added with cycle 2  7.  12/03/2018-hospital admission secondary to decline in performance status, decreased oral intake, confusion, and falls.  Mr. Veva Holes continues to decline and appears to be actively dying.  This has been discussed with his wife who is at the bedside.  She understand that he has not a candidate for any additional treatment his cancer.  He is a DNR/DNI.    Recommendations: 1.  Begin morphine drip. 2.  Scopolamine patch ordered. 3.  Continue comfort measures. 4.  DNR/DNI.  Anticipate a hospital death.  The patient's wife was at the bedside and updated.   LOS: 1 day   Mikey Bussing, DNP, AGPCNP-BC, AOCNP 13-Dec-2018 Mr. Celesta Aver was examined.  His wife was at the bedside.  He appears to be actively dying.  I explained poor prognosis with an expected life span of hours to a day.  We will begin additional comfort measures to include a morphine drip and as needed Ativan.

## 2018-12-22 NOTE — Progress Notes (Signed)
   12/14/18 1200  Clinical Encounter Type  Visited With Patient and family together  Visit Type Initial;Psychological support;Spiritual support;Patient actively dying  Referral From Nurse  Consult/Referral To Chaplain  Spiritual Encounters  Spiritual Needs Prayer;Emotional;Other (Comment) (Spiritual Care Conversation/Support)  Stress Factors  Patient Stress Factors Not reviewed  Family Stress Factors Loss;Major life changes   I visited with [name] and his wife per spiritual care consult. I was aware that Mr. Nies is actively dying and provided grief support for his wife. We prayed at the bedside and she talked about how long they had been married. I walked her through a life review, and she even talked about goals.  She was interested in information about Advance Directives. Per her request, I provided her with the document and education about the document.   Please, contact Spiritual Care for further assistance.   Chaplain Shanon Ace M.Div., [facility]

## 2018-12-22 NOTE — Progress Notes (Signed)
PROGRESS NOTE    Lawyer Buster  [id number] DOB: [birth year] DOA: 12/08/2018 PCP: Vivi Barrack, MD   Brief Narrative:63 year old white male undifferentiated metastatic cancer-status post multiple rounds of chemo FOLFIRI Avastin 11/16/2018-worsening ECOG status Patient is unable to give me the history and wife tells me that ever since last Thursday after he had a fall he is unable to do much and has been more listless She relates he was somewhat functional at last oncology office visit 9/9 but has declined precipitously since then.  Assessment & Plan:   Active Problems:   Cancer ([place])   #1 right lower quadrant mass with extensive liver mets end-stage-patient is actively dying here for comfort care comfort orders placed morphine drip started expecting hospital death.    Estimated body mass index is 22.29 kg/m as calculated from the following:   Height as of 11/30/18: 5\' 11"  (1.803 m).   Weight as of this encounter: 72.5 kg.      Subjective: Patient resting in bed wife by the bedside appears tachypneic  Objective: Vitals:   12/02/2018 1604 12/16/2018 2059 12/23/18 0441  BP: 99/64  133/65  Pulse: (!) 102  (!) 124  Resp: 18  (!) 35  Temp: 97.8 F (36.6 C)    TempSrc: Oral    SpO2: 96%  (!) 75%  Weight:  72.5 kg     Intake/Output Summary (Last 24 hours) at 12-23-18 1448 Last data filed at Dec 23, 2018 1211 Gross per 24 hour  Intake 1722.99 ml  Output 300 ml  Net 1422.99 ml   Filed Weights   12/19/2018 2059  Weight: 72.5 kg    Examination: Opens eyes looks at me  General exam: Appears calm and comfortable  Respiratory system: Clear to auscultation. Respiratory effort normal. Cardiovascular system: S1 & S2 heard, RRR. No JVD, murmurs, rubs, gallops or clicks. No pedal edema. Gastrointestinal system: Abdomen is distended, soft and nontender. No organomegaly or masses felt. Normal bowel sounds heard. Central nervous system: Alert and oriented. No focal neurological  deficits. Extremities 3+ bilateral pitting edema Skin: No rashes, lesions or ulcers Psychiatry: Judgement and insight appear normal. Mood & affect appropriate.     Data Reviewed: I have personally reviewed following labs and imaging studies  CBC: Recent Labs  Lab 11/30/18 1220  WBC 1.9*  NEUTROABS 0.4*  HGB 10.3*  HCT 31.4*  MCV 106.8*  PLT [id number]   Basic Metabolic Panel: Recent Labs  Lab 11/30/18 1220  NA 130*  K 3.9  CL 95*  CO2 21*  GLUCOSE 130*  BUN 16  CREATININE 0.98  CALCIUM 8.4*   GFR: Estimated Creatinine Clearance: 78.1 mL/min (by C-G formula based on SCr of 0.98 mg/dL). Liver Function Tests: Recent Labs  Lab 11/30/18 1220  AST 31  ALT 12  ALKPHOS 495*  BILITOT 1.2  PROT 7.2  ALBUMIN 2.2*   No results for input(s): LIPASE, AMYLASE in the last 168 hours. No results for input(s): AMMONIA in the last 168 hours. Coagulation Profile: No results for input(s): INR, PROTIME in the last 168 hours. Cardiac Enzymes: No results for input(s): CKTOTAL, CKMB, CKMBINDEX, TROPONINI in the last 168 hours. BNP (last 3 results) No results for input(s): PROBNP in the last 8760 hours. HbA1C: No results for input(s): HGBA1C in the last 72 hours. CBG: No results for input(s): GLUCAP in the last 168 hours. Lipid Profile: No results for input(s): CHOL, HDL, LDLCALC, TRIG, CHOLHDL, LDLDIRECT in the last 72 hours. Thyroid Function Tests: No results for input(s): TSH,  T4TOTAL, FREET4, T3FREE, THYROIDAB in the last 72 hours. Anemia Panel: No results for input(s): VITAMINB12, FOLATE, FERRITIN, TIBC, IRON, RETICCTPCT in the last 72 hours. Sepsis Labs: No results for input(s): PROCALCITON, LATICACIDVEN in the last 168 hours.  Recent Results (from the past 240 hour(s))  SARS Coronavirus 2 [facility] order, Performed in [facility] hospital lab) Nasopharyngeal Nasopharyngeal Swab     Status: None   Collection Time: 12/21/2018  2:04 PM   Specimen: Nasopharyngeal Swab  Result  Value Ref Range Status   SARS Coronavirus 2 NEGATIVE NEGATIVE Final    Comment: (NOTE) If result is NEGATIVE SARS-CoV-2 target nucleic acids are NOT DETECTED. The SARS-CoV-2 RNA is generally detectable in upper and lower  respiratory specimens during the acute phase of infection. The lowest  concentration of SARS-CoV-2 viral copies this assay can detect is 250  copies / mL. A negative result does not preclude SARS-CoV-2 infection  and should not be used as the sole basis for treatment or other  patient management decisions.  A negative result may occur with  improper specimen collection / handling, submission of specimen other  than nasopharyngeal swab, presence of viral mutation(s) within the  areas targeted by this assay, and inadequate number of viral copies  (<250 copies / mL). A negative result must be combined with clinical  observations, patient history, and epidemiological information. If result is POSITIVE SARS-CoV-2 target nucleic acids are DETECTED. The SARS-CoV-2 RNA is generally detectable in upper and lower  respiratory specimens dur ing the acute phase of infection.  Positive  results are indicative of active infection with SARS-CoV-2.  Clinical  correlation with patient history and other diagnostic information is  necessary to determine patient infection status.  Positive results do  not rule out bacterial infection or co-infection with other viruses. If result is PRESUMPTIVE POSTIVE SARS-CoV-2 nucleic acids MAY BE PRESENT.   A presumptive positive result was obtained on the submitted specimen  and confirmed on repeat testing.  While 2019 novel coronavirus  (SARS-CoV-2) nucleic acids may be present in the submitted sample  additional confirmatory testing may be necessary for epidemiological  and / or clinical management purposes  to differentiate between  SARS-CoV-2 and other Sarbecovirus currently known to infect humans.  If clinically indicated additional testing  with an alternate test  methodology [phone]) is advised. The SARS-CoV-2 RNA is generally  detectable in upper and lower respiratory sp ecimens during the acute  phase of infection. The expected result is Negative. Fact Sheet for Patients:  StrictlyIdeas.no Fact Sheet for Healthcare Providers: BankingDealers.co.za This test is not yet approved or cleared by the [place] FDA and has been authorized for detection and/or diagnosis of SARS-CoV-2 by FDA under an Emergency Use Authorization (EUA).  This EUA will remain in effect (meaning this test can be used) for the duration of the COVID-19 declaration under Section 564(b)(1) of the Act, 21 U.S.C. section 360bbb-3(b)(1), unless the authorization is terminated or revoked sooner. Performed at [facility], [place] [address] [place], [place] [address]          Radiology Studies: No results found.      Scheduled Meds: . scopolamine  1 patch Transdermal Q72H   Continuous Infusions: . sodium chloride 20 mL/hr at 01/02/19 1211  . morphine 2 mg/hr (Jan 02, 2019 1211)     LOS: 1 day     Georgette Shell, MD [facility]  If 7PM-7AM, please contact night-coverage [email or web address] Password [email or web address] 01/02/2019, 2:48 PM

## 2018-12-22 NOTE — Progress Notes (Signed)
Consuela Mimes RN  and I Aloha Gell RN  Wasted 90 ml"s of morphine in the [facility]  Stericycle

## 2018-12-22 NOTE — Death Summary Note (Signed)
DEATH SUMMARY   Patient Details  Name: Thomas Riley MRN: [id number] DOB: 1955-05-29  Admission/Discharge Information   Admit Date:  12/16/2018  Date of Death: Date of Death: 2018-12-17  Time of Death: Time of Death: 06-27-18  Length of Stay: 1  Referring Physician: Vivi Barrack, MD   Reason(s) for Hospitalization  63 year old white male undifferentiated metastatic cancer-status post multiple rounds of chemo FOLFIRI Avastin 11/16/2018-worsening ECOG status Patient is unable to give me the history and wife tells me that ever since last Thursday after he had a fall he is unable to do much and has been more listless She relates he was somewhat functional at last oncology office visit 9/9 but has declined precipitously since then.  Diagnoses  Preliminary cause of death:  Secondary Diagnoses (including complications and co-morbidities):  Active Problems:   Cancer [facility])   Metastatic cancer to liver [facility]) Right lower quadrant mass with extensive mets-colon cancer with mets to the liver to the liver end-stage patient was admitted to the hospital comfort care was started and he passed away without much pain.  Brief Hospital Course (including significant findings, care, treatment, and services provided and events leading to death)  Thomas Riley is a 63 y.o. year old male who was admitted with some falls and confusion and no oral intake.  Wife reported he has been vomiting intermittently and vomit smelled like stool.  Wife was at the bedside throughout his hospital stay.    Pertinent Labs and Studies  Significant Diagnostic Studies Ct Chest W Contrast  Result Date: 11/15/2018 CLINICAL DATA:  Metastatic colon cancer, chemotherapy completed. 10 pounds weight loss since June 2020. Lower extremity swelling for 1.5 weeks. Lower abdominal pain and nausea. EXAM: CT CHEST, ABDOMEN, AND PELVIS WITH CONTRAST TECHNIQUE: Multidetector CT imaging of the chest, abdomen and pelvis was performed following the  standard protocol during bolus administration of intravenous contrast. CONTRAST:  134mL OMNIPAQUE IOHEXOL 300 MG/ML  SOLN COMPARISON:  Multiple exams, including 09/09/2018 and PET-CT from 05/10/2018 FINDINGS: CT CHEST FINDINGS Cardiovascular: Right Port-A-Cath tip: Right atrium. Mild atherosclerotic calcification of the aortic arch. Mediastinum/Nodes: Small mediastinal lymph nodes are not pathologically enlarged. Contrast medium in the distal esophagus, potentially from reflux or dysmotility. Lungs/Pleura: New and enlarging bilateral pulmonary nodules are present compatible with metastatic lesions. An index pleural based lesion in the right lower lobe on image 104/6 measures 1.4 by 1.1 cm. A second index lesion anteriorly in the left upper lobe measures 1.5 by 0.9 cm on image 79/6. There is a small amount of mucus tracking in the trachea. Atelectasis noted along the hemidiaphragms, right greater than left. Musculoskeletal: Unremarkable CT ABDOMEN PELVIS FINDINGS Hepatobiliary: Increase in diffuse tumor burden throughout the liver resulting in significantly expanded volume of the liver compared to the prior exam. An index lesion in the upper portion of the caudate lobe measures 3.5 by 3.2 cm on image 49/2, formerly 1.5 by 1.1 cm. The previously used index lesions are obscured by surrounding tumor and difficult to measure, but the overall tumor burden in the liver is considerably worsened. Gallbladder unremarkable although surrounded by the perihepatic ascites. There is prominent effacement of the intrahepatic portion of the IVC by tumor causing extrinsic mass effect. Pancreas: Unremarkable Spleen: Unremarkable Adrenals/Urinary Tract: Stable mild fullness of the left adrenal gland without discrete mass. Urinary bladder unremarkable. Stomach/Bowel: Possible wall thickening along the greater curvature of the stomach for example on image 76/2, versus adjacent fluid along the gastric margin. Sigmoid colon diverticulosis.  Vascular/Lymphatic: Aortoiliac  atherosclerotic vascular disease. Reproductive: Unremarkable Other: Extensive ascites and fluid infiltration of the mesentery and omentum. Along the right paracolic gutter, a soft tissue density which could represent peritoneal tumor or a thickened appendix abuts the cecum and adjacent terminal ileum, and measures 2.8 by 2.0 cm on image 92/2, formerly measuring 1.8 by 1.4 cm on 09/09/2018. Along the pelvic ascites for example on images 112-114 of series 2, there is dependent density which could possibly from the from tumor. Musculoskeletal: Degenerative disc disease at L5-S1 with loss of disc height. Foraminal impingement observed at L3-4, L4-5, and on the left at L5-S1. IMPRESSION: 1. Unfortunately there has been considerable progression of disease with new scattered bilateral pulmonary metastatic nodules, and considerable increase in diffuse tumor burden throughout the liver. 2. Increased ascites, with suspected tumor nodularity along the right paracolic gutter and likely along the pelvic ascites. 3. Extrinsic mass effect from tumor effaces the intrahepatic portion of the IVC, causing it to have a slit like morphology. 4. Cannot exclude wall thickening in the stomach. 5. Other imaging findings of potential clinical significance: Aortic Atherosclerosis (ICD10-I70.0). Esophageal reflux versus dysmotility. Sigmoid colon diverticulosis. Lower lumbar foraminal impingement due to spondylosis and degenerative disc disease. Electronically Signed   By: Van Clines M.D.   On: 11/15/2018 16:06   Ct Abdomen Pelvis W Contrast  Result Date: 11/15/2018 CLINICAL DATA:  Metastatic colon cancer, chemotherapy completed. 10 pounds weight loss since June 2020. Lower extremity swelling for 1.5 weeks. Lower abdominal pain and nausea. EXAM: CT CHEST, ABDOMEN, AND PELVIS WITH CONTRAST TECHNIQUE: Multidetector CT imaging of the chest, abdomen and pelvis was performed following the standard protocol  during bolus administration of intravenous contrast. CONTRAST:  143mL OMNIPAQUE IOHEXOL 300 MG/ML  SOLN COMPARISON:  Multiple exams, including 09/09/2018 and PET-CT from 05/10/2018 FINDINGS: CT CHEST FINDINGS Cardiovascular: Right Port-A-Cath tip: Right atrium. Mild atherosclerotic calcification of the aortic arch. Mediastinum/Nodes: Small mediastinal lymph nodes are not pathologically enlarged. Contrast medium in the distal esophagus, potentially from reflux or dysmotility. Lungs/Pleura: New and enlarging bilateral pulmonary nodules are present compatible with metastatic lesions. An index pleural based lesion in the right lower lobe on image 104/6 measures 1.4 by 1.1 cm. A second index lesion anteriorly in the left upper lobe measures 1.5 by 0.9 cm on image 79/6. There is a small amount of mucus tracking in the trachea. Atelectasis noted along the hemidiaphragms, right greater than left. Musculoskeletal: Unremarkable CT ABDOMEN PELVIS FINDINGS Hepatobiliary: Increase in diffuse tumor burden throughout the liver resulting in significantly expanded volume of the liver compared to the prior exam. An index lesion in the upper portion of the caudate lobe measures 3.5 by 3.2 cm on image 49/2, formerly 1.5 by 1.1 cm. The previously used index lesions are obscured by surrounding tumor and difficult to measure, but the overall tumor burden in the liver is considerably worsened. Gallbladder unremarkable although surrounded by the perihepatic ascites. There is prominent effacement of the intrahepatic portion of the IVC by tumor causing extrinsic mass effect. Pancreas: Unremarkable Spleen: Unremarkable Adrenals/Urinary Tract: Stable mild fullness of the left adrenal gland without discrete mass. Urinary bladder unremarkable. Stomach/Bowel: Possible wall thickening along the greater curvature of the stomach for example on image 76/2, versus adjacent fluid along the gastric margin. Sigmoid colon diverticulosis.  Vascular/Lymphatic: Aortoiliac atherosclerotic vascular disease. Reproductive: Unremarkable Other: Extensive ascites and fluid infiltration of the mesentery and omentum. Along the right paracolic gutter, a soft tissue density which could represent peritoneal tumor or a thickened appendix abuts  the cecum and adjacent terminal ileum, and measures 2.8 by 2.0 cm on image 92/2, formerly measuring 1.8 by 1.4 cm on 09/09/2018. Along the pelvic ascites for example on images 112-114 of series 2, there is dependent density which could possibly from the from tumor. Musculoskeletal: Degenerative disc disease at L5-S1 with loss of disc height. Foraminal impingement observed at L3-4, L4-5, and on the left at L5-S1. IMPRESSION: 1. Unfortunately there has been considerable progression of disease with new scattered bilateral pulmonary metastatic nodules, and considerable increase in diffuse tumor burden throughout the liver. 2. Increased ascites, with suspected tumor nodularity along the right paracolic gutter and likely along the pelvic ascites. 3. Extrinsic mass effect from tumor effaces the intrahepatic portion of the IVC, causing it to have a slit like morphology. 4. Cannot exclude wall thickening in the stomach. 5. Other imaging findings of potential clinical significance: Aortic Atherosclerosis (ICD10-I70.0). Esophageal reflux versus dysmotility. Sigmoid colon diverticulosis. Lower lumbar foraminal impingement due to spondylosis and degenerative disc disease. Electronically Signed   By: Van Clines M.D.   On: 11/15/2018 16:06    Microbiology Recent Results (from the past 240 hour(s))  SARS Coronavirus 2 [facility] order, Performed in [facility] hospital lab) Nasopharyngeal Nasopharyngeal Swab     Status: None   Collection Time: 11/28/2018  2:04 PM   Specimen: Nasopharyngeal Swab  Result Value Ref Range Status   SARS Coronavirus 2 NEGATIVE NEGATIVE Final    Comment: (NOTE) If result is NEGATIVE SARS-CoV-2  target nucleic acids are NOT DETECTED. The SARS-CoV-2 RNA is generally detectable in upper and lower  respiratory specimens during the acute phase of infection. The lowest  concentration of SARS-CoV-2 viral copies this assay can detect is 250  copies / mL. A negative result does not preclude SARS-CoV-2 infection  and should not be used as the sole basis for treatment or other  patient management decisions.  A negative result may occur with  improper specimen collection / handling, submission of specimen other  than nasopharyngeal swab, presence of viral mutation(s) within the  areas targeted by this assay, and inadequate number of viral copies  (<250 copies / mL). A negative result must be combined with clinical  observations, patient history, and epidemiological information. If result is POSITIVE SARS-CoV-2 target nucleic acids are DETECTED. The SARS-CoV-2 RNA is generally detectable in upper and lower  respiratory specimens dur ing the acute phase of infection.  Positive  results are indicative of active infection with SARS-CoV-2.  Clinical  correlation with patient history and other diagnostic information is  necessary to determine patient infection status.  Positive results do  not rule out bacterial infection or co-infection with other viruses. If result is PRESUMPTIVE POSTIVE SARS-CoV-2 nucleic acids MAY BE PRESENT.   A presumptive positive result was obtained on the submitted specimen  and confirmed on repeat testing.  While 2019 novel coronavirus  (SARS-CoV-2) nucleic acids may be present in the submitted sample  additional confirmatory testing may be necessary for epidemiological  and / or clinical management purposes  to differentiate between  SARS-CoV-2 and other Sarbecovirus currently known to infect humans.  If clinically indicated additional testing with an alternate test  methodology [phone]) is advised. The SARS-CoV-2 RNA is generally  detectable in upper and lower  respiratory sp ecimens during the acute  phase of infection. The expected result is Negative. Fact Sheet for Patients:  StrictlyIdeas.no Fact Sheet for Healthcare Providers: BankingDealers.co.za This test is not yet approved or cleared by the [place]  FDA and has been authorized for detection and/or diagnosis of SARS-CoV-2 by FDA under an Emergency Use Authorization (EUA).  This EUA will remain in effect (meaning this test can be used) for the duration of the COVID-19 declaration under Section 564(b)(1) of the Act, 21 U.S.C. section 360bbb-3(b)(1), unless the authorization is terminated or revoked sooner. Performed at [facility], [place] [address] [place], [place] [address]     Lab Basic Metabolic Panel: No results for input(s): NA, K, CL, CO2, GLUCOSE, BUN, CREATININE, CALCIUM, MG, PHOS in the last 168 hours. Liver Function Tests: No results for input(s): AST, ALT, ALKPHOS, BILITOT, PROT, ALBUMIN in the last 168 hours. No results for input(s): LIPASE, AMYLASE in the last 168 hours. No results for input(s): AMMONIA in the last 168 hours. CBC: No results for input(s): WBC, NEUTROABS, HGB, HCT, MCV, PLT in the last 168 hours. Cardiac Enzymes: No results for input(s): CKTOTAL, CKMB, CKMBINDEX, TROPONINI in the last 168 hours. Sepsis Labs: No results for input(s): PROCALCITON, WBC, LATICACIDVEN in the last 168 hours.  Procedures/Operations     Georgette Shell 12/12/2018, 2:49 PM

## 2018-12-22 DEATH — deceased

## 2019-09-07 IMAGING — US IR FLUORO GUIDE CV LINE*L*
1 series · 1 of 1 positions shown · non-contrast
Comparison: none

CLINICAL DATA: Colon carcinoma metastatic to liver. Needs durable
venous access for planned chemotherapy regimen.
TECHNIQUE: The procedure, risks, benefits, and alternatives were explained to
the patient. Questions regarding the procedure were encouraged and
answered. The patient understands and consents to the procedure. As
antibiotic prophylaxis, cefazolin 1 g was ordered pre-procedure and
administered intravenously within one hour of incision. Patency of
the right IJ vein was confirmed with ultrasound with image
documentation. An appropriate skin site was determined. Skin site
was marked. Region was prepped using maximum barrier technique
including cap and mask, sterile gown, sterile gloves, large sterile
sheet, and Chlorhexidine as cutaneous antisepsis. The region was
infiltrated locally with 1% lidocaine. Under real-time ultrasound
guidance, the right IJ vein was accessed with a 21 gauge
micropuncture needle; the needle tip within the vein was confirmed
with ultrasound image documentation. Needle was exchanged over a 018
guidewire for transitional dilator which allowed passage of the
Benson wire into the IVC. Over this, the transitional dilator was
exchanged for a 5 French MPA catheter. A small incision was made on
the right anterior chest wall and a subcutaneous pocket fashioned.
The power-injectable port was positioned and its catheter tunneled
to the right IJ dermatotomy site. The MPA catheter was exchanged
over an Amplatz wire for a peel-away sheath, through which the port
catheter, which had been trimmed to the appropriate length, was
advanced and positioned under fluoroscopy with its tip at the
cavoatrial junction. Spot chest radiograph confirms good catheter
position and no pneumothorax. The pocket was closed with deep
interrupted and subcuticular continuous 3-0 Monocryl sutures. The
port was flushed per protocol. The incisions were covered with
Dermabond then covered with a sterile dressing. The previously
placed right arm PICC line was then removed.

COMPLICATIONS:
COMPLICATIONS
None immediate

[Series 1: ir fluoro/shunt/fist · 1 of 1 slices shown]
[im 1/1]
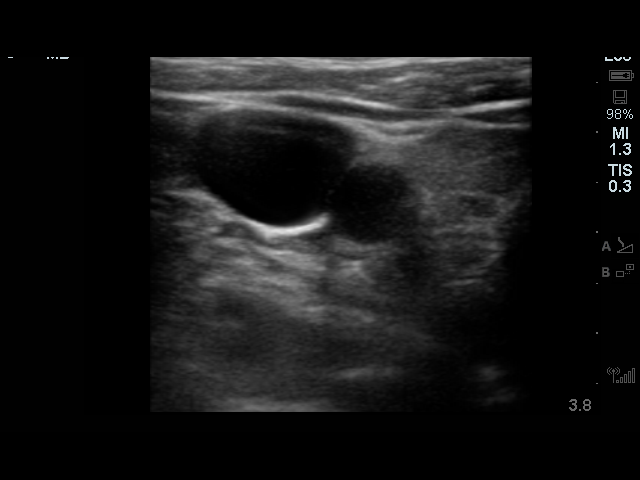

[1 of 1 positions shown; findings below may reference images not displayed]

EXAM:
TUNNELED PORT CATHETER PLACEMENT WITH ULTRASOUND AND FLUOROSCOPIC
GUIDANCE

FLUOROSCOPY TIME:  0.2 minute; 64  [id number] DAP

ANESTHESIA/SEDATION:
Intravenous Fentanyl and Versed were administered as conscious
sedation during continuous monitoring of the patient's level of
consciousness and physiological / cardiorespiratory status by the
radiology RN, with a total moderate sedation time of 19 minutes.
IMPRESSION: Technically successful right IJ power-injectable port catheter
placement. Ready for routine use.

## 2020-01-07 IMAGING — CT CT ABDOMEN AND PELVIS WITH CONTRAST
2 of 5 series · 14 of 46 positions shown, 16 images · IV contrast (OMNIPAQUE)
Comparison: CT 07/01/2018

CLINICAL DATA: Colorectal carcinoma with liver metastasis.
Chemotherapy ongoing. RIGHT lower quadrant pain 5 months

EXAM:
CT ABDOMEN AND PELVIS WITH CONTRAST
TECHNIQUE: Multidetector CT imaging of the abdomen and pelvis was performed
using the standard protocol following bolus administration of
intravenous contrast.
CONTRAST:  100mL OMNIPAQUE IOHEXOL 300 MG/ML  SOLN

[Series 2: axial st · axial · 0.74mm/px · z∈[+880,+1285]mm · 11 of 99 slices shown, 13 images]
[im 9/99  soft-tissue]
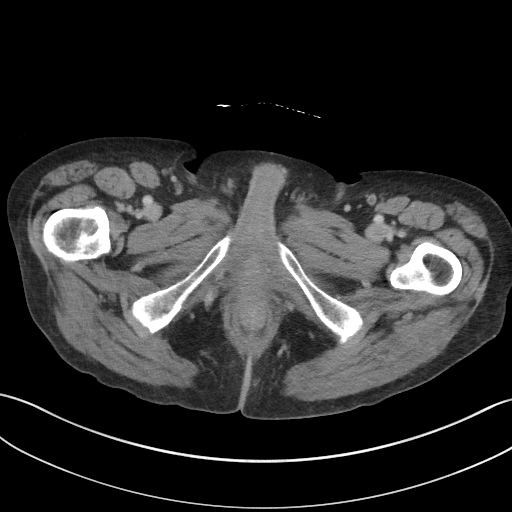
[im 9/99  bone]
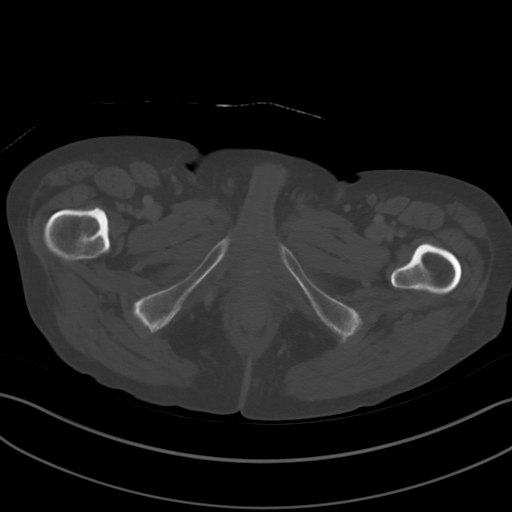
[im 17/99  soft-tissue]
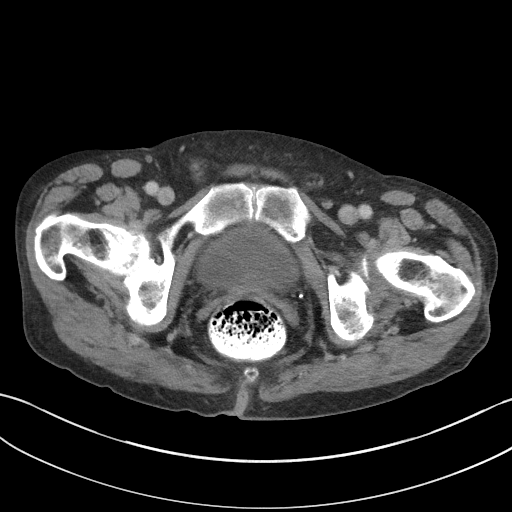
[im 25/99  soft-tissue]
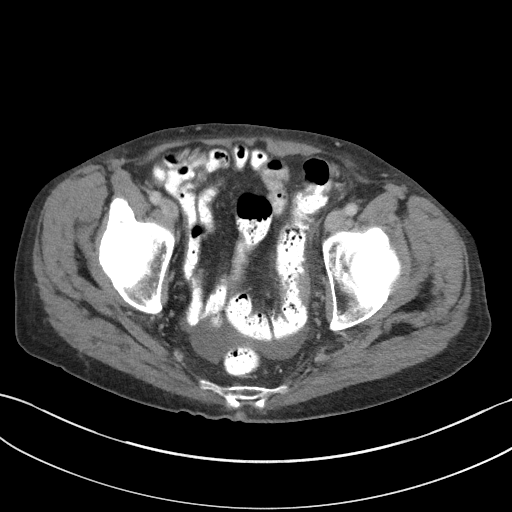
[im 33/99  soft-tissue]
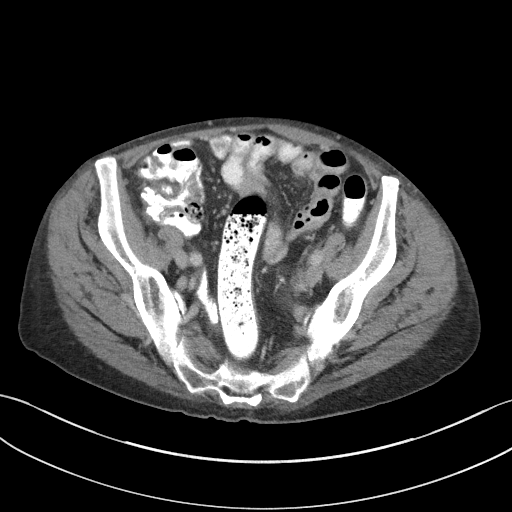
[im 41/99  soft-tissue]
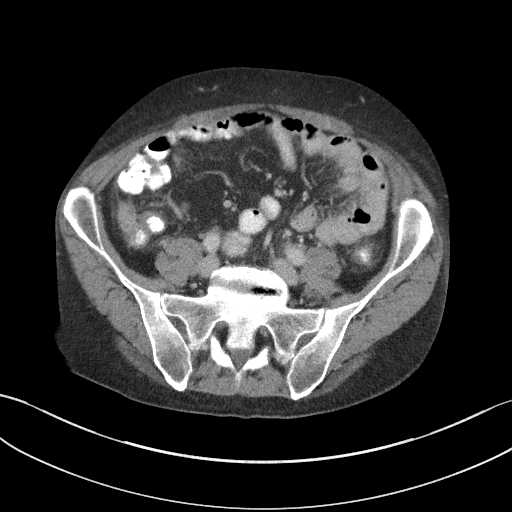
[im 50/99  soft-tissue]
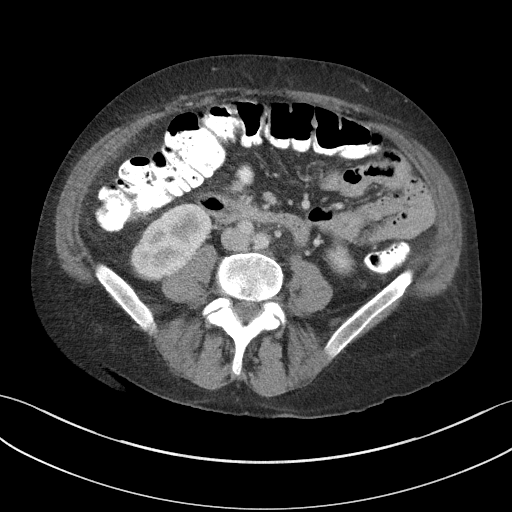
[im 58/99  soft-tissue]
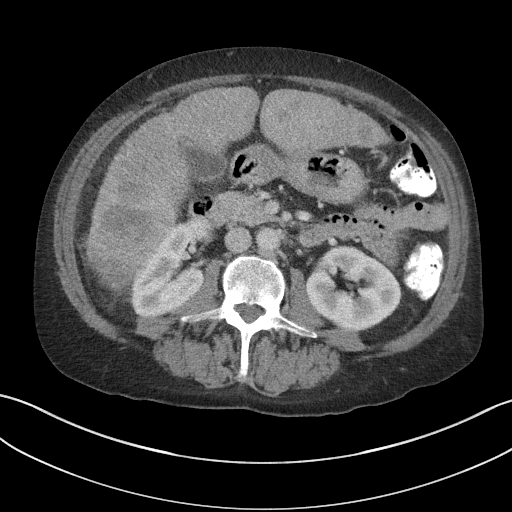
[im 66/99  soft-tissue]
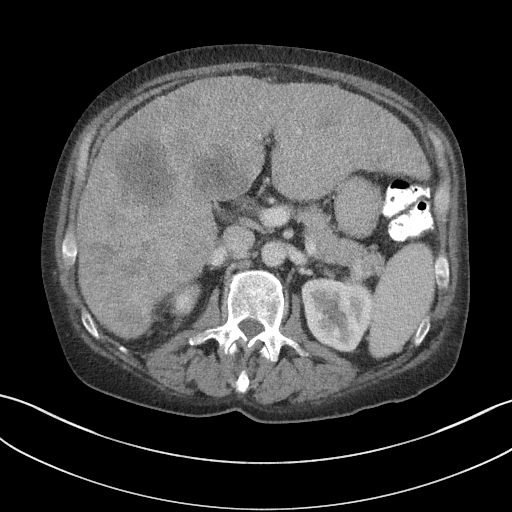
[im 74/99  soft-tissue]
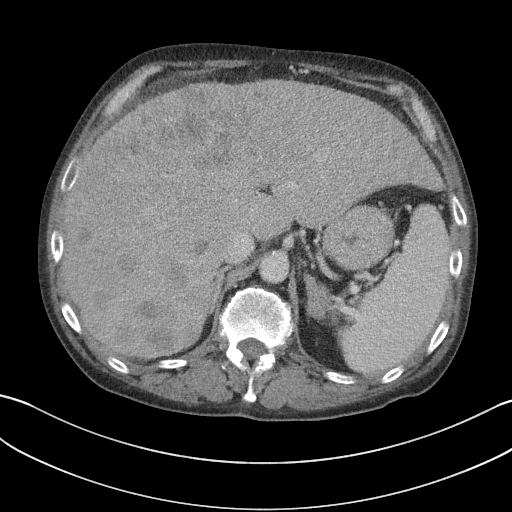
[im 74/99  bone]
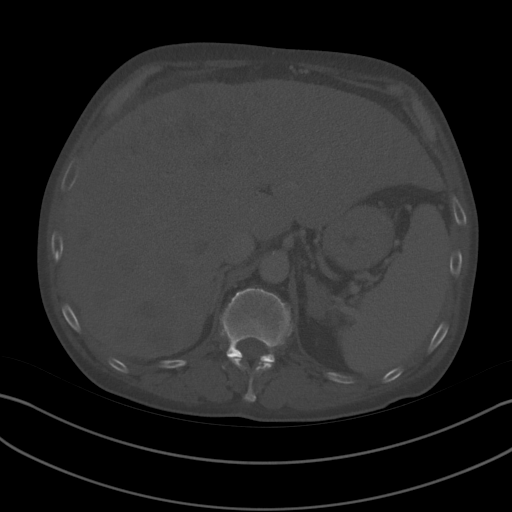
[im 82/99  soft-tissue]
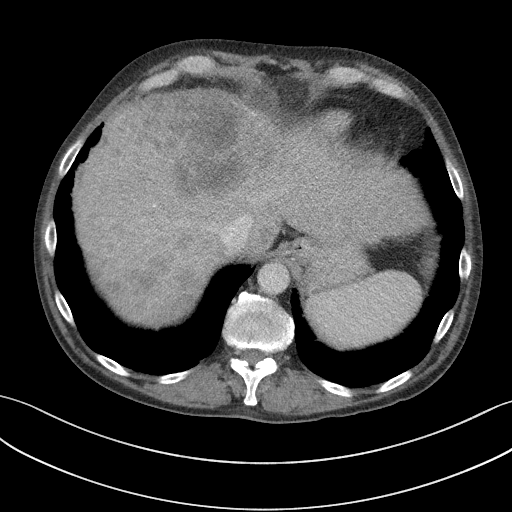
[im 90/99  soft-tissue]
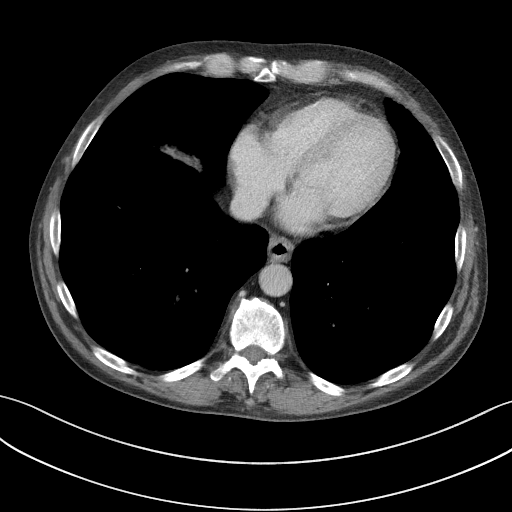

[Series 5: coronal st · coronal · 0.73mm/px · 3 of 101 slices shown]
[im 34/101  soft-tissue]
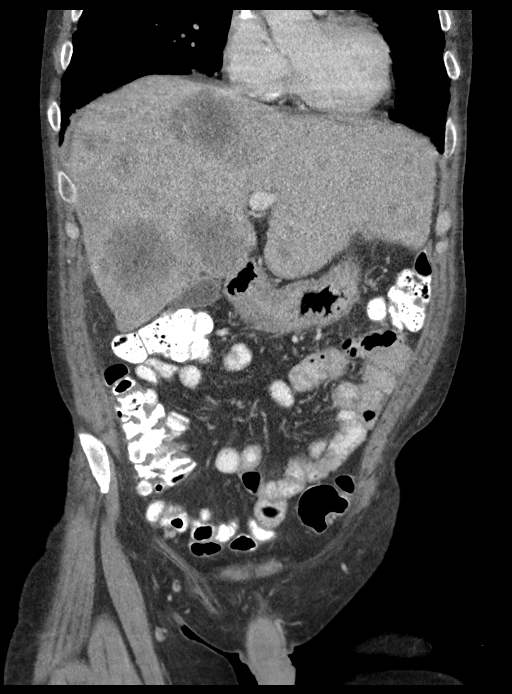
[im 45/101  soft-tissue]
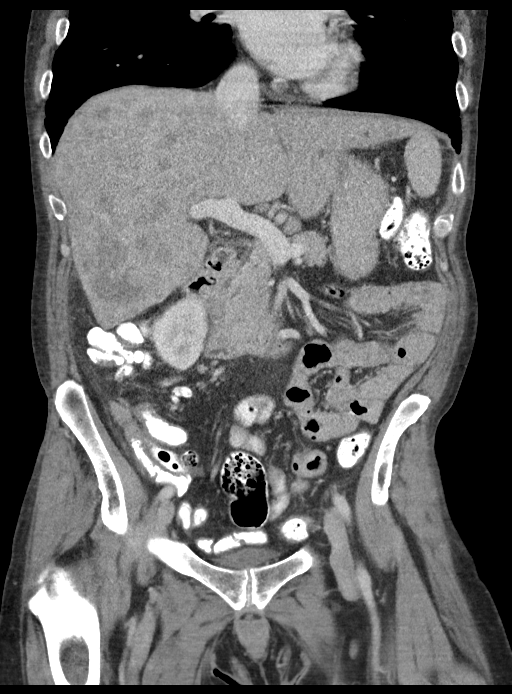
[im 56/101  soft-tissue]
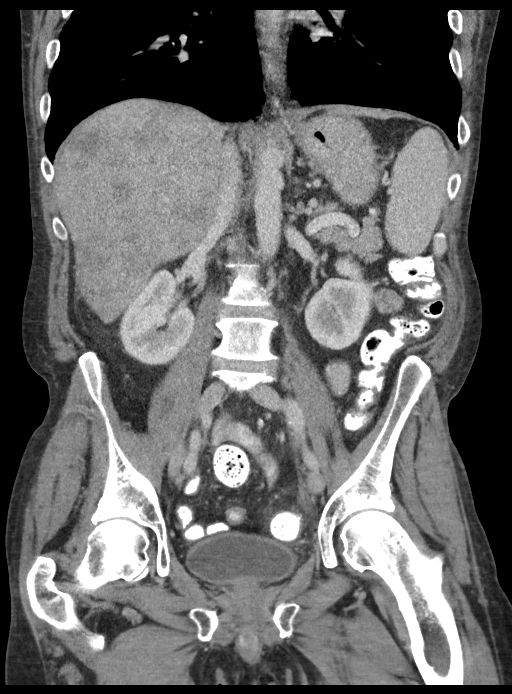

[14 of 46 positions shown; findings below may reference images not displayed]

FINDINGS: Lower chest: Small subpleural RIGHT middle lobe nodule measuring 6
mm (image [DATE]). Nodule not identified on comparison exam.

Hepatobiliary: Multifocal hepatic metastasis again noted. Dominant
confluency mass lesion in the central LEFT hepatic lobe measures
x 7.5 cm compared to 6.0 9.0 cm for some reduction in volume.
Subcapsular lesion LEFT hepatic lobe measures 20 mm compared to 26
mm. Central LEFT hepatic lobe lesion adjacent to the porta hepatis
measures 4.6 cm compared to 5.3 cm. Visually the lesion superior
improved.

No periportal lymphadenopathy identified. No ascites. No biliary
duct dilatation

Pancreas: Pancreas is normal. No ductal dilatation. No pancreatic
inflammation.

Spleen: Normal spleen

Adrenals/urinary tract: Adrenal glands and kidneys are normal.
Stable thickening LEFT adrenal gland favored adenoma. The ureters
and bladder normal.

Stomach/Bowel: Stomach, small bowel, appendix, and cecum are normal.

Along the anti mesenteric border of the cecum pericolic implant
measuring 16 mm (image 60/2) is decreased from 21 mm. No new
implants or adenopathy in the RIGHT lower quadrant. The ileocecal
lymph node described on comparison exam is now normal size.

Within the mid sigmoid colon there is an inflamed diverticulum
(image 72/2). Small amount free fluid the pelvis. No perforation or
abscess. This inflamed diverticulum is seen on image 61/5 and image
72/2.

Vascular/Lymphatic: Abdominal aorta is normal caliber. No periportal
or retroperitoneal adenopathy. No pelvic adenopathy.

Reproductive: Prostate normal

Other: No free fluid.

Musculoskeletal: No aggressive osseous lesion.
IMPRESSION: 1. MILD ACUTE DIVERTICULITIS of the sigmoid colon. Small volume
intra peritoneal pelvic free fluid presumably related to
diverticulitis.
2. Reduction in size of pericecal implant and ileocecal
lymphadenopathy.
3. Some mild improvement in widespread hepatic metastasis.
4. Small subpleural RIGHT middle lobe pulmonary nodule not
identified on prior. Recommend attention on follow-up.

These results will be called to the ordering clinician or
representative by the Radiologist Assistant, and communication
documented in the PACS or zVision Dashboard.

## 2023-02-10 NOTE — Telephone Encounter (Signed)
Telephone call
